# Patient Record
Sex: Male | Born: 1945
Health system: Southern US, Community
[De-identification: ages and names within clinical notes are randomized; demographics above are authoritative.]

## PROBLEM LIST (undated history)

## (undated) DIAGNOSIS — I10 Essential (primary) hypertension: Secondary | ICD-10-CM

## (undated) DIAGNOSIS — E785 Hyperlipidemia, unspecified: Secondary | ICD-10-CM

## (undated) DIAGNOSIS — F329 Major depressive disorder, single episode, unspecified: Secondary | ICD-10-CM

## (undated) DIAGNOSIS — E039 Hypothyroidism, unspecified: Secondary | ICD-10-CM

## (undated) DIAGNOSIS — E119 Type 2 diabetes mellitus without complications: Secondary | ICD-10-CM

## (undated) DIAGNOSIS — F32A Depression, unspecified: Secondary | ICD-10-CM

## (undated) HISTORY — DX: Hypothyroidism, unspecified: E03.9

## (undated) HISTORY — DX: Major depressive disorder, single episode, unspecified: F32.9

## (undated) HISTORY — DX: Hyperlipidemia, unspecified: E78.5

## (undated) HISTORY — DX: Depression, unspecified: F32.A

## (undated) HISTORY — DX: Essential (primary) hypertension: I10

## (undated) HISTORY — PX: FOOT SURGERY: SHX648

---

## 2004-11-15 ENCOUNTER — Ambulatory Visit: Payer: Self-pay | Admitting: Ophthalmology

## 2010-04-16 LAB — HEPATIC FUNCTION PANEL
ALT: 26 U/L (ref 10–40)
AST: 21 U/L (ref 14–40)
Alkaline Phosphatase: 68 U/L (ref 25–125)
Bilirubin, Total: 0.5 mg/dL

## 2010-04-16 LAB — BASIC METABOLIC PANEL
Potassium: 4.1 mmol/L (ref 3.4–5.3)
Sodium: 137 mmol/L (ref 137–147)

## 2010-07-27 LAB — HM DIABETES EYE EXAM

## 2011-08-08 ENCOUNTER — Telehealth: Payer: Self-pay | Admitting: Internal Medicine

## 2011-08-08 NOTE — Telephone Encounter (Signed)
Please send Rx to [facility], patient has signed medical record release at [facility]

## 2011-08-10 ENCOUNTER — Other Ambulatory Visit: Payer: Self-pay | Admitting: Internal Medicine

## 2011-08-14 ENCOUNTER — Other Ambulatory Visit: Payer: Self-pay | Admitting: *Deleted

## 2011-08-14 ENCOUNTER — Encounter: Payer: Self-pay | Admitting: Internal Medicine

## 2011-08-14 MED ORDER — ATORVASTATIN CALCIUM 20 MG PO TABS: 20.0000 mg | ORAL_TABLET | Freq: Every day | ORAL | Status: DC

## 2011-08-14 MED ORDER — CITALOPRAM HYDROBROMIDE 20 MG PO TABS: 20.0000 mg | ORAL_TABLET | Freq: Every day | ORAL | Status: DC

## 2011-08-14 NOTE — Telephone Encounter (Signed)
Is this okay to refill? 

## 2011-08-14 NOTE — Telephone Encounter (Signed)
PT CALLED BACK BMP WAS TO FAX OVER MED LIST  HE IS OUT OF HIS RX'S  PLEASE SEND TO [facility]

## 2011-08-14 NOTE — Telephone Encounter (Signed)
Spoke with patient and advised him to call his pharmacy and have them fax [place] refill request because we have still not received his records from [facility] and so therefore do not have any of his meds on file.

## 2011-09-13 ENCOUNTER — Encounter: Payer: Self-pay | Admitting: Internal Medicine

## 2011-10-11 ENCOUNTER — Ambulatory Visit (INDEPENDENT_AMBULATORY_CARE_PROVIDER_SITE_OTHER): Payer: PRIVATE HEALTH INSURANCE | Admitting: Internal Medicine

## 2011-10-11 ENCOUNTER — Encounter: Payer: Self-pay | Admitting: Internal Medicine

## 2011-10-11 VITALS — BP 132/72 | HR 69 | Temp 98.2°F | Ht 68.0 in | Wt 250.0 lb

## 2011-10-11 DIAGNOSIS — E039 Hypothyroidism, unspecified: Secondary | ICD-10-CM | POA: Insufficient documentation

## 2011-10-11 DIAGNOSIS — E663 Overweight: Secondary | ICD-10-CM | POA: Insufficient documentation

## 2011-10-11 DIAGNOSIS — J309 Allergic rhinitis, unspecified: Secondary | ICD-10-CM | POA: Insufficient documentation

## 2011-10-11 DIAGNOSIS — E785 Hyperlipidemia, unspecified: Secondary | ICD-10-CM | POA: Insufficient documentation

## 2011-10-11 DIAGNOSIS — I10 Essential (primary) hypertension: Secondary | ICD-10-CM | POA: Insufficient documentation

## 2011-10-11 DIAGNOSIS — F329 Major depressive disorder, single episode, unspecified: Secondary | ICD-10-CM

## 2011-10-11 DIAGNOSIS — F411 Generalized anxiety disorder: Secondary | ICD-10-CM | POA: Insufficient documentation

## 2011-10-11 DIAGNOSIS — E669 Obesity, unspecified: Secondary | ICD-10-CM

## 2011-10-11 MED ORDER — MOMETASONE FUROATE 50 MCG/ACT NA SUSP: 1.0000 | Freq: Every day | NASAL | Status: DC

## 2011-10-11 MED ORDER — ATORVASTATIN CALCIUM 20 MG PO TABS: 20.0000 mg | ORAL_TABLET | Freq: Every day | ORAL | Status: DC

## 2011-10-11 MED ORDER — LEVOTHYROXINE SODIUM 50 MCG PO TABS: 50.0000 ug | ORAL_TABLET | Freq: Every day | ORAL | Status: DC

## 2011-10-11 MED ORDER — CITALOPRAM HYDROBROMIDE 20 MG PO TABS: 20.0000 mg | ORAL_TABLET | Freq: Every day | ORAL | Status: DC

## 2011-10-11 MED ORDER — LISINOPRIL-HYDROCHLOROTHIAZIDE 10-12.5 MG PO TABS: 1.0000 | ORAL_TABLET | Freq: Every day | ORAL | Status: DC

## 2011-10-11 NOTE — Progress Notes (Signed)
Subjective:    Patient ID: Timothy Edwards, male    DOB: Dec 20, 1945, 65 y.o.   MRN: [id number]  HPI 65 year old male with a history of hypertension, hyperlipidemia, and hypothyroidism presents for followup. He reports that he has been feeling well. He reports full compliance with his medications. His primary concern today is weight gain. He notes that he is the heaviest he has ever been in his life. He is particularly concerned about abdominal obesity. He notes that he has been fairly sedentary and his retirement. 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 He notes some dietary indiscretion with increased intake of carbohydrates. He has not been counting calories or keeping a food diary. He does not get any regular exercise.  Outpatient Encounter Prescriptions as of 10/11/2011  Medication Sig Dispense Refill  . atorvastatin (LIPITOR) 20 MG tablet Take 1 tablet (20 mg total) by mouth daily.  90 tablet  3  . cholecalciferol (VITAMIN D) 1000 UNITS tablet Take 1,000 Units by mouth daily.        . citalopram (CELEXA) 20 MG tablet Take 1 tablet (20 mg total) by mouth daily.  90 tablet  3  . levothyroxine (SYNTHROID) 50 MCG tablet Take 1 tablet (50 mcg total) by mouth daily.  90 tablet  3  . lisinopril-hydrochlorothiazide (PRINZIDE,ZESTORETIC) 10-12.5 MG per tablet Take 1 tablet by mouth daily.  90 tablet  3  . mometasone (NASONEX) 50 MCG/ACT nasal spray Place 1 spray into the nose daily.  17 g  3    Review of Systems  Constitutional: Negative for fever, chills, activity change, appetite change, fatigue and unexpected weight change.  Eyes: Negative for visual disturbance.  Respiratory: Negative for cough and shortness of breath.   Cardiovascular: Negative for chest pain, palpitations and leg swelling.  Gastrointestinal: Negative for abdominal pain and abdominal distention.  Genitourinary: Negative for dysuria, urgency and difficulty urinating.  Musculoskeletal: Negative for  arthralgias and gait problem.  Skin: Negative for color change and rash.  Hematological: Negative for adenopathy.  Psychiatric/Behavioral: Negative for sleep disturbance and dysphoric mood. The patient is not nervous/anxious.    BP 132/72  Pulse 69  Temp(Src) 98.2 F (36.8 C) (Oral)  Wt 250 lb (113.399 kg)  SpO2 96%     Objective:   Physical Exam  Constitutional: He is oriented to person, place, and time. He appears well-developed and well-nourished. No distress.       Obese   HENT:  Head: Normocephalic and atraumatic.  Right Ear: External ear normal.  Left Ear: External ear normal.  Nose: Nose normal.  Mouth/Throat: Oropharynx is clear and moist. No oropharyngeal exudate.  Eyes: Conjunctivae and EOM are normal. Pupils are equal, round, and reactive to light. Right eye exhibits no discharge. Left eye exhibits no discharge. No scleral icterus.  Neck: Normal range of motion. Neck supple. No tracheal deviation present. No thyromegaly present.  Cardiovascular: Normal rate, regular rhythm and normal heart sounds.  Exam reveals no gallop and no friction rub.   No murmur heard. Pulmonary/Chest: Effort normal and breath sounds normal. No respiratory distress. He has no wheezes. He has no rales. He exhibits no tenderness.  Musculoskeletal: Normal range of motion. He exhibits no edema.  Lymphadenopathy:    He has no cervical adenopathy.  Neurological: He is alert and oriented to person, place, and time. No cranial nerve deficit. Coordination normal.  Skin: Skin is warm and dry. No rash noted. He is not diaphoretic. No erythema. No  pallor.  Psychiatric: He has a normal mood and affect. His behavior is normal. Judgment and thought content normal.          Assessment & Plan:  1. Obesity - discussed potential interventions to help with weight loss including calorie restriction. We also discussed a goal of regular aerobic exercise with a goal of 30-60 minutes most days of the week. We  discussed some commercial plan such as Weight Watchers. We also discussed the risk of central obesity as an independent risk factor for heart disease. Patient will look into some of the things we discussed and then we'll start effort at weight loss with a goal of 1-2 pounds of weight loss per week. He will followup in one month.  2. Hypertension - BP well controlled on current meds. Renal function with labs this week. Follow up 6 months.  3. Hypothyroidism - Will check TSH with labs. Continue Synthroid.  4. Hyperlipidemia - Will check lipids and LFTs with labs. Continue Lipitor.

## 2011-10-11 NOTE — Patient Instructions (Signed)
Call if any concerns. 

## 2011-11-10 ENCOUNTER — Ambulatory Visit: Payer: PRIVATE HEALTH INSURANCE | Admitting: Internal Medicine

## 2011-11-16 ENCOUNTER — Other Ambulatory Visit: Payer: Self-pay | Admitting: *Deleted

## 2011-11-16 DIAGNOSIS — E785 Hyperlipidemia, unspecified: Secondary | ICD-10-CM

## 2011-11-16 MED ORDER — ATORVASTATIN CALCIUM 20 MG PO TABS: 20.0000 mg | ORAL_TABLET | Freq: Every day | ORAL | Status: DC

## 2011-12-11 ENCOUNTER — Ambulatory Visit: Payer: PRIVATE HEALTH INSURANCE | Admitting: Internal Medicine

## 2011-12-18 ENCOUNTER — Telehealth: Payer: Self-pay | Admitting: Internal Medicine

## 2011-12-18 NOTE — Telephone Encounter (Signed)
[phone] Pt called had questions about 10/11/11.  Ins co told him that this was billed under the wrong diagnostic code.

## 2012-01-02 ENCOUNTER — Ambulatory Visit (INDEPENDENT_AMBULATORY_CARE_PROVIDER_SITE_OTHER): Payer: PRIVATE HEALTH INSURANCE | Admitting: Internal Medicine

## 2012-01-02 ENCOUNTER — Encounter: Payer: Self-pay | Admitting: Internal Medicine

## 2012-01-02 DIAGNOSIS — E669 Obesity, unspecified: Secondary | ICD-10-CM

## 2012-01-02 DIAGNOSIS — E039 Hypothyroidism, unspecified: Secondary | ICD-10-CM

## 2012-01-02 DIAGNOSIS — I10 Essential (primary) hypertension: Secondary | ICD-10-CM

## 2012-01-02 DIAGNOSIS — E785 Hyperlipidemia, unspecified: Secondary | ICD-10-CM

## 2012-01-02 LAB — COMPREHENSIVE METABOLIC PANEL
Albumin: 3.9 g/dL (ref 3.5–5.2)
Alkaline Phosphatase: 55 U/L (ref 39–117)
BUN: 15 mg/dL (ref 6–23)
CO2: 29 mEq/L (ref 19–32)
Calcium: 9.1 mg/dL (ref 8.4–10.5)
GFR: 98.11 mL/min (ref >60.00)
Glucose, Bld: 101 mg/dL — ABNORMAL HIGH (ref 70–99)
Potassium: 4 mEq/L (ref 3.5–5.1)

## 2012-01-02 LAB — LIPID PANEL
Cholesterol: 173 mg/dL (ref 0–200)
LDL Cholesterol: 103 mg/dL — ABNORMAL HIGH (ref 0–99)
Triglycerides: 67 mg/dL (ref 0.0–149.0)

## 2012-01-02 NOTE — Assessment & Plan Note (Signed)
Will check TSH with labs today. 

## 2012-01-02 NOTE — Assessment & Plan Note (Signed)
BMI 34. Encouraged patient to keep a food diary. Encouraged patient to limit intake of processed carbohydrates. Encouraged him to exercise by walking 30-60 minutes 5 days per week. Goal would be 1-2 pounds of weight loss per week. He will followup in one month.

## 2012-01-02 NOTE — Assessment & Plan Note (Signed)
Will check lipid profile and LFTs with labs today.

## 2012-01-02 NOTE — Progress Notes (Signed)
  Subjective:    Patient ID: Timothy Edwards, male    DOB: Aug 14, 1946, 66 y.o.   MRN: [id number]  HPI 66 year old male with history of hypertension, hypothyroidism, hyperlipidemia, and obesity presents for followup. He reports that over the last couple of months, he started making some efforts at weight loss, then lost motivation over the holidays. He has gained 1 pound since his last visit. He is sedentary and has not been exercising regularly. He reports intake of high sugar and high-fat foods. He is interested in losing weight would like to get back on track.  In regards to his hypertension, he reports full compliance with his medications. He denies any chest pain, palpitations, headache. In regards to his hypothyroidism, he also reports full compliance with his Synthroid. He denies any fatigue, constipation, or hot or cold intolerance.    Review of Systems  Constitutional: Negative for fever, chills, activity change, appetite change, fatigue and unexpected weight change.  Eyes: Negative for visual disturbance.  Respiratory: Negative for cough and shortness of breath.   Cardiovascular: Negative for chest pain, palpitations and leg swelling.  Gastrointestinal: Negative for abdominal pain and abdominal distention.  Genitourinary: Negative for dysuria, urgency and difficulty urinating.  Musculoskeletal: Negative for arthralgias and gait problem.  Skin: Negative for color change and rash.  Hematological: Negative for adenopathy.  Psychiatric/Behavioral: Negative for sleep disturbance and dysphoric mood. The patient is not nervous/anxious.        Objective:   Physical Exam  Constitutional: He is oriented to person, place, and time. He appears well-developed and well-nourished. No distress.  HENT:  Head: Normocephalic and atraumatic.  Right Ear: External ear normal.  Left Ear: External ear normal.  Nose: Nose normal.  Mouth/Throat: Oropharynx is clear and moist. No oropharyngeal exudate.   Eyes: Conjunctivae and EOM are normal. Pupils are equal, round, and reactive to light. Right eye exhibits no discharge. Left eye exhibits no discharge. No scleral icterus.  Neck: Normal range of motion. Neck supple. No tracheal deviation present. No thyromegaly present.  Cardiovascular: Normal rate, regular rhythm and normal heart sounds.  Exam reveals no gallop and no friction rub.   No murmur heard. Pulmonary/Chest: Effort normal and breath sounds normal. No respiratory distress. He has no wheezes. He has no rales. He exhibits no tenderness.  Musculoskeletal: Normal range of motion. He exhibits no edema.  Lymphadenopathy:    He has no cervical adenopathy.  Neurological: He is alert and oriented to person, place, and time. No cranial nerve deficit. Coordination normal.  Skin: Skin is warm and dry. No rash noted. He is not diaphoretic. No erythema. No pallor.  Psychiatric: He has a normal mood and affect. His behavior is normal. Judgment and thought content normal.          Assessment & Plan:

## 2012-01-02 NOTE — Assessment & Plan Note (Signed)
Blood pressure well-controlled today. We'll check renal function with labs. Followup in one month.

## 2012-01-02 NOTE — Patient Instructions (Signed)
Goal - Walk 30-46min at least 5 days per week. Goal - Record food intake.

## 2012-01-03 ENCOUNTER — Telehealth: Payer: Self-pay | Admitting: *Deleted

## 2012-01-03 NOTE — Telephone Encounter (Signed)
Message copied by Vernie Murders on Wed Jan 03, 2012  8:00 AM ------      Message from: Ronna Polio A      Created: Tue Jan 02, 2012  2:34 PM       Labs show that liver function tests are slightly high.  This can be from viral infection, weight gain, or other cause. Would like to repeat LFTS with visit in 1 month.

## 2012-01-10 NOTE — Telephone Encounter (Signed)
I called and spoke with the billing department they were sending it to the coding department to look at.  Advised patient that they were looking into it and to disregard bill for at least 45 days per Billing.

## 2012-02-08 ENCOUNTER — Ambulatory Visit: Payer: PRIVATE HEALTH INSURANCE | Admitting: Internal Medicine

## 2012-02-08 DIAGNOSIS — Z0289 Encounter for other administrative examinations: Secondary | ICD-10-CM

## 2012-02-16 ENCOUNTER — Encounter: Payer: Self-pay | Admitting: Internal Medicine

## 2012-02-16 DIAGNOSIS — E559 Vitamin D deficiency, unspecified: Secondary | ICD-10-CM | POA: Insufficient documentation

## 2012-07-18 ENCOUNTER — Other Ambulatory Visit (INDEPENDENT_AMBULATORY_CARE_PROVIDER_SITE_OTHER): Payer: PRIVATE HEALTH INSURANCE | Admitting: *Deleted

## 2012-07-18 DIAGNOSIS — E785 Hyperlipidemia, unspecified: Secondary | ICD-10-CM

## 2012-07-18 DIAGNOSIS — E039 Hypothyroidism, unspecified: Secondary | ICD-10-CM

## 2012-07-18 DIAGNOSIS — I10 Essential (primary) hypertension: Secondary | ICD-10-CM

## 2012-07-18 LAB — LIPID PANEL
Cholesterol: 151 mg/dL (ref 0–200)
HDL: 49.2 mg/dL (ref >39.00)
LDL Cholesterol: 74 mg/dL (ref 0–99)
Triglycerides: 141 mg/dL (ref 0.0–149.0)
VLDL: 28.2 mg/dL (ref 0.0–40.0)

## 2012-07-18 LAB — COMPREHENSIVE METABOLIC PANEL
Albumin: 3.8 g/dL (ref 3.5–5.2)
Alkaline Phosphatase: 63 U/L (ref 39–117)
BUN: 13 mg/dL (ref 6–23)
Creatinine, Ser: 0.7 mg/dL (ref 0.4–1.5)
Glucose, Bld: 106 mg/dL — ABNORMAL HIGH (ref 70–99)
Total Bilirubin: 0.7 mg/dL (ref 0.3–1.2)

## 2012-07-24 ENCOUNTER — Ambulatory Visit (INDEPENDENT_AMBULATORY_CARE_PROVIDER_SITE_OTHER): Payer: PRIVATE HEALTH INSURANCE | Admitting: Internal Medicine

## 2012-07-24 ENCOUNTER — Encounter: Payer: Self-pay | Admitting: Internal Medicine

## 2012-07-24 VITALS — BP 160/90 | HR 69 | Temp 98.7°F | Wt 253.2 lb

## 2012-07-24 DIAGNOSIS — I10 Essential (primary) hypertension: Secondary | ICD-10-CM

## 2012-07-24 DIAGNOSIS — E039 Hypothyroidism, unspecified: Secondary | ICD-10-CM

## 2012-07-24 DIAGNOSIS — E669 Obesity, unspecified: Secondary | ICD-10-CM

## 2012-07-24 DIAGNOSIS — E785 Hyperlipidemia, unspecified: Secondary | ICD-10-CM

## 2012-07-24 NOTE — Progress Notes (Signed)
Subjective:    Patient ID: Timothy Edwards, male    DOB: 16-Oct-1946, 66 y.o.   MRN: [id number]  HPI 66 year old male with history of hypertension, hyperlipidemia, hypothyroidism, obesity presents for followup. He reports he is generally feeling well. He notes some dietary indiscretion recently, for example eating 2 cakes that his wife made over a three-day period. He reports that it is difficult to limit his food intake, particularly carbohydrate intake when he is staying at home. He has not started an exercise program. He would like to lose weight.  In regards to his chronic medical issues including hypertension, hyperlipidemia, and hypothyroidism he reports full compliance with his medications. He reports that his blood pressure has been well-controlled at home, typically 120s over 80s. He denies any chest pain, palpitations, headache.  Outpatient Encounter Prescriptions as of 07/24/2012  Medication Sig Dispense Refill  . atorvastatin (LIPITOR) 20 MG tablet Take 1 tablet (20 mg total) by mouth daily.  90 tablet  3  . cholecalciferol (VITAMIN D) 1000 UNITS tablet Take 1,000 Units by mouth daily.        . citalopram (CELEXA) 20 MG tablet Take 1 tablet (20 mg total) by mouth daily.  90 tablet  3  . levothyroxine (SYNTHROID) 50 MCG tablet Take 1 tablet (50 mcg total) by mouth daily.  90 tablet  3  . lisinopril-hydrochlorothiazide (PRINZIDE,ZESTORETIC) 10-12.5 MG per tablet Take 1 tablet by mouth daily.  90 tablet  3  . mometasone (NASONEX) 50 MCG/ACT nasal spray Place 1 spray into the nose daily as needed.       BP 160/90  Pulse 69  Temp 98.7 F (37.1 C) (Oral)  Wt 253 lb 4 oz (114.873 kg)  SpO2 99%  Review of Systems  Constitutional: Negative for fever, chills, activity change, appetite change, fatigue and unexpected weight change.  Eyes: Negative for visual disturbance.  Respiratory: Negative for cough and shortness of breath.   Cardiovascular: Negative for chest pain, palpitations and  leg swelling.  Gastrointestinal: Negative for abdominal pain and abdominal distention.  Genitourinary: Negative for dysuria, urgency and difficulty urinating.  Musculoskeletal: Negative for arthralgias and gait problem.  Skin: Negative for color change and rash.  Hematological: Negative for adenopathy.  Psychiatric/Behavioral: Negative for disturbed wake/sleep cycle and dysphoric mood. The patient is not nervous/anxious.        Objective:   Physical Exam  Constitutional: He is oriented to person, place, and time. He appears well-developed and well-nourished. No distress.  HENT:  Head: Normocephalic and atraumatic.  Right Ear: External ear normal.  Left Ear: External ear normal.  Nose: Nose normal.  Mouth/Throat: Oropharynx is clear and moist. No oropharyngeal exudate.  Eyes: Conjunctivae and EOM are normal. Pupils are equal, round, and reactive to light. Right eye exhibits no discharge. Left eye exhibits no discharge. No scleral icterus.  Neck: Normal range of motion. Neck supple. No tracheal deviation present. No thyromegaly present.  Cardiovascular: Normal rate, regular rhythm and normal heart sounds.  Exam reveals no gallop and no friction rub.   No murmur heard. Pulmonary/Chest: Effort normal and breath sounds normal. No respiratory distress. He has no wheezes. He has no rales. He exhibits no tenderness.  Musculoskeletal: Normal range of motion. He exhibits no edema.  Lymphadenopathy:    He has no cervical adenopathy.  Neurological: He is alert and oriented to person, place, and time. No cranial nerve deficit. Coordination normal.  Skin: Skin is warm and dry. No rash noted. He is not diaphoretic. No  erythema. No pallor.  Psychiatric: He has a normal mood and affect. His behavior is normal. Judgment and thought content normal.          Assessment & Plan:

## 2012-07-24 NOTE — Assessment & Plan Note (Signed)
Recent TSH was normal. Will continue Synthroid.

## 2012-07-24 NOTE — Assessment & Plan Note (Signed)
Blood pressure is elevated today, however patient reports good control of blood pressure at home. We'll have him monitor blood pressure at home and e-mail with results. He will followup in one month for recheck. We'll continue medications as they are for now.

## 2012-07-24 NOTE — Assessment & Plan Note (Signed)
Encouraged better compliance with diet and increase physical activity with goal of walking 30 minutes daily. We also discussed keeping a food diary. Followup in one month.

## 2012-07-24 NOTE — Assessment & Plan Note (Signed)
Recent lipids were well controlled with Lipitor. LFTs were improved. Will continue Lipitor. Followup one month.

## 2012-07-24 NOTE — Patient Instructions (Addendum)
Check blood pressure at home 1-2 times per week. Email with readings.   [email or web address]

## 2012-08-26 ENCOUNTER — Ambulatory Visit (INDEPENDENT_AMBULATORY_CARE_PROVIDER_SITE_OTHER): Payer: PRIVATE HEALTH INSURANCE | Admitting: Internal Medicine

## 2012-08-26 ENCOUNTER — Encounter: Payer: Self-pay | Admitting: Internal Medicine

## 2012-08-26 VITALS — BP 122/82 | HR 73 | Temp 98.3°F | Ht 71.0 in | Wt 247.0 lb

## 2012-08-26 DIAGNOSIS — I10 Essential (primary) hypertension: Secondary | ICD-10-CM

## 2012-08-26 DIAGNOSIS — E669 Obesity, unspecified: Secondary | ICD-10-CM

## 2012-08-26 NOTE — Progress Notes (Signed)
Subjective:    Patient ID: Timothy Edwards, male    DOB: June 02, 1946, 66 y.o.   MRN: [id number]  HPI 66 year old male with history of hypertension, hyperlipidemia, hypothyroidism presents for followup. At his last visit, blood pressure was noted to be elevated. He reports that over the last month, blood pressure has been fairly well-controlled at home. He did not bring a record of blood pressure today. He denies any headache, chest pain, palpitations. He reports full compliance with medication. He notes he has been trying to improve his diet and has lost approximately 6 pounds.  Outpatient Encounter Prescriptions as of 08/26/2012  Medication Sig Dispense Refill  . atorvastatin (LIPITOR) 20 MG tablet Take 1 tablet (20 mg total) by mouth daily.  90 tablet  3  . cholecalciferol (VITAMIN D) 1000 UNITS tablet Take 1,000 Units by mouth daily.        . citalopram (CELEXA) 20 MG tablet Take 1 tablet (20 mg total) by mouth daily.  90 tablet  3  . levothyroxine (SYNTHROID) 50 MCG tablet Take 1 tablet (50 mcg total) by mouth daily.  90 tablet  3  . lisinopril-hydrochlorothiazide (PRINZIDE,ZESTORETIC) 10-12.5 MG per tablet Take 1 tablet by mouth daily.  90 tablet  3  . DISCONTD: mometasone (NASONEX) 50 MCG/ACT nasal spray Place 1 spray into the nose daily as needed.       BP 122/82  Pulse 73  Temp 98.3 F (36.8 C) (Oral)  Ht 5\' 11"  (1.803 m)  Wt 247 lb (112.038 kg)  BMI 34.45 kg/m2  SpO2 95%  Review of Systems  Constitutional: Negative for fever, chills, activity change, appetite change, fatigue and unexpected weight change.  Eyes: Negative for visual disturbance.  Respiratory: Negative for cough and shortness of breath.   Cardiovascular: Negative for chest pain, palpitations and leg swelling.  Gastrointestinal: Negative for abdominal pain and abdominal distention.  Genitourinary: Negative for dysuria, urgency and difficulty urinating.  Musculoskeletal: Negative for arthralgias and gait problem.    Skin: Negative for color change and rash.  Hematological: Negative for adenopathy.  Psychiatric/Behavioral: Negative for disturbed wake/sleep cycle and dysphoric mood. The patient is not nervous/anxious.        Objective:   Physical Exam  Constitutional: He is oriented to person, place, and time. He appears well-developed and well-nourished. No distress.  HENT:  Head: Normocephalic and atraumatic.  Right Ear: External ear normal.  Left Ear: External ear normal.  Nose: Nose normal.  Mouth/Throat: Oropharynx is clear and moist. No oropharyngeal exudate.  Eyes: Conjunctivae normal and EOM are normal. Pupils are equal, round, and reactive to light. Right eye exhibits no discharge. Left eye exhibits no discharge. No scleral icterus.  Neck: Normal range of motion. Neck supple. No tracheal deviation present. No thyromegaly present.  Cardiovascular: Normal rate, regular rhythm and normal heart sounds.  Exam reveals no gallop and no friction rub.   No murmur heard. Pulmonary/Chest: Effort normal and breath sounds normal. No respiratory distress. He has no wheezes. He has no rales. He exhibits no tenderness.  Musculoskeletal: Normal range of motion. He exhibits no edema.  Lymphadenopathy:    He has no cervical adenopathy.  Neurological: He is alert and oriented to person, place, and time. No cranial nerve deficit. Coordination normal.  Skin: Skin is warm and dry. No rash noted. He is not diaphoretic. No erythema. No pallor.  Psychiatric: He has a normal mood and affect. His behavior is normal. Judgment and thought content normal.  Assessment & Plan:

## 2012-08-26 NOTE — Assessment & Plan Note (Signed)
Congratulated patient on 6 pounds weight loss. Encouraged him to continue efforts at healthy diet and regular physical activity.

## 2012-08-26 NOTE — Assessment & Plan Note (Signed)
Blood pressure much better controlled today. We'll continue to monitor. Follow up 3 months.

## 2012-11-25 ENCOUNTER — Ambulatory Visit (INDEPENDENT_AMBULATORY_CARE_PROVIDER_SITE_OTHER): Payer: PRIVATE HEALTH INSURANCE | Admitting: Internal Medicine

## 2012-11-25 ENCOUNTER — Encounter: Payer: Self-pay | Admitting: Internal Medicine

## 2012-11-25 VITALS — BP 124/78 | HR 71 | Temp 98.1°F | Resp 17 | Wt 248.0 lb

## 2012-11-25 DIAGNOSIS — F329 Major depressive disorder, single episode, unspecified: Secondary | ICD-10-CM

## 2012-11-25 DIAGNOSIS — E669 Obesity, unspecified: Secondary | ICD-10-CM

## 2012-11-25 DIAGNOSIS — I10 Essential (primary) hypertension: Secondary | ICD-10-CM

## 2012-11-25 DIAGNOSIS — F3289 Other specified depressive episodes: Secondary | ICD-10-CM

## 2012-11-25 DIAGNOSIS — F32A Depression, unspecified: Secondary | ICD-10-CM

## 2012-11-25 DIAGNOSIS — E785 Hyperlipidemia, unspecified: Secondary | ICD-10-CM

## 2012-11-25 MED ORDER — CITALOPRAM HYDROBROMIDE 20 MG PO TABS: 20.0000 mg | ORAL_TABLET | Freq: Every day | ORAL | Status: DC

## 2012-11-25 MED ORDER — ATORVASTATIN CALCIUM 20 MG PO TABS: 20.0000 mg | ORAL_TABLET | Freq: Every day | ORAL | Status: DC

## 2012-11-25 NOTE — Assessment & Plan Note (Signed)
Lipids have been well controlled with Lipitor. We'll plan to recheck lipids and LFTs at next visit in 3 months.

## 2012-11-25 NOTE — Assessment & Plan Note (Signed)
BP Readings from Last 3 Encounters:  11/25/12 124/78  08/26/12 122/82  07/24/12 160/90    Blood pressure well-controlled with current medications. We'll plan to recheck renal function with labs in 3 months.

## 2012-11-25 NOTE — Assessment & Plan Note (Signed)
Symptoms well controlled with Celexa. We'll continue.

## 2012-11-25 NOTE — Assessment & Plan Note (Addendum)
Wt Readings from Last 3 Encounters:  11/25/12 248 lb (112.492 kg)  08/26/12 247 lb (112.038 kg)  07/24/12 253 lb 4 oz (114.873 kg)     BMI 34. No improvement in weight since last visit. Encouraged patient to have better compliance with diet and set goal of walking at least 40 minutes 3 times per week. Followup 3 months or sooner as needed.

## 2012-11-25 NOTE — Progress Notes (Signed)
  Subjective:    Patient ID: Timothy Edwards, male    DOB: March 31, 1946, 66 y.o.   MRN: [id number]  HPI 66 year old male with history of obesity, hypertension, hypothyroidism, hyperlipidemia, depression presents for followup. He reports that he is generally feeling well. He notes some dietary indiscretion over the holidays. He exercises occasionally by walking his dog for approximately 15 minutes. He does not follow any specific diet or exercise program however. He reports full compliance with his medications. He denies any new concerns today.  Outpatient Encounter Prescriptions as of 11/25/2012  Medication Sig Dispense Refill  . cholecalciferol (VITAMIN D) 1000 UNITS tablet Take 1,000 Units by mouth daily.        Marland Kitchen levothyroxine (SYNTHROID) 50 MCG tablet Take 1 tablet (50 mcg total) by mouth daily.  90 tablet  3  . lisinopril-hydrochlorothiazide (PRINZIDE,ZESTORETIC) 10-12.5 MG per tablet Take 1 tablet by mouth daily.  90 tablet  3  . atorvastatin (LIPITOR) 20 MG tablet Take 1 tablet (20 mg total) by mouth daily.  90 tablet  3  . citalopram (CELEXA) 20 MG tablet Take 1 tablet (20 mg total) by mouth daily.  90 tablet  3  . [DISCONTINUED] atorvastatin (LIPITOR) 20 MG tablet Take 1 tablet (20 mg total) by mouth daily.  90 tablet  3   BP 124/78  Pulse 71  Temp 98.1 F (36.7 C) (Oral)  Resp 17  Wt 248 lb (112.492 kg)  Review of Systems  Constitutional: Negative for fever, chills, activity change, appetite change, fatigue and unexpected weight change.  Eyes: Negative for visual disturbance.  Respiratory: Negative for cough and shortness of breath.   Cardiovascular: Negative for chest pain, palpitations and leg swelling.  Gastrointestinal: Negative for abdominal pain and abdominal distention.  Genitourinary: Negative for dysuria, urgency and difficulty urinating.  Musculoskeletal: Negative for arthralgias and gait problem.  Skin: Negative for color change and rash.  Hematological: Negative for  adenopathy.  Psychiatric/Behavioral: Negative for sleep disturbance and dysphoric mood. The patient is not nervous/anxious.        Objective:   Physical Exam  Constitutional: He is oriented to person, place, and time. He appears well-developed and well-nourished. No distress.  HENT:  Head: Normocephalic and atraumatic.  Right Ear: External ear normal.  Left Ear: External ear normal.  Nose: Nose normal.  Mouth/Throat: Oropharynx is clear and moist. No oropharyngeal exudate.  Eyes: Conjunctivae normal and EOM are normal. Pupils are equal, round, and reactive to light. Right eye exhibits no discharge. Left eye exhibits no discharge. No scleral icterus.  Neck: Normal range of motion. Neck supple. No tracheal deviation present. No thyromegaly present.  Cardiovascular: Normal rate, regular rhythm and normal heart sounds.  Exam reveals no gallop and no friction rub.   No murmur heard. Pulmonary/Chest: Effort normal and breath sounds normal. No respiratory distress. He has no wheezes. He has no rales. He exhibits no tenderness.  Musculoskeletal: Normal range of motion. He exhibits no edema.  Lymphadenopathy:    He has no cervical adenopathy.  Neurological: He is alert and oriented to person, place, and time. No cranial nerve deficit. Coordination normal.  Skin: Skin is warm and dry. No rash noted. He is not diaphoretic. No erythema. No pallor.  Psychiatric: He has a normal mood and affect. His behavior is normal. Judgment and thought content normal.          Assessment & Plan:

## 2012-12-23 ENCOUNTER — Telehealth: Payer: Self-pay | Admitting: Internal Medicine

## 2012-12-23 DIAGNOSIS — I10 Essential (primary) hypertension: Secondary | ICD-10-CM

## 2012-12-23 DIAGNOSIS — E039 Hypothyroidism, unspecified: Secondary | ICD-10-CM

## 2012-12-23 NOTE — Telephone Encounter (Addendum)
levothyroxine (SYNTHROID) 50 MCG tablet  # 90  lisinopril-hydrochlorothiazide (PRINZIDE,ZESTORETIC) 10-12.5 #90

## 2012-12-24 MED ORDER — LISINOPRIL-HYDROCHLOROTHIAZIDE 10-12.5 MG PO TABS: 1.0000 | ORAL_TABLET | Freq: Every day | ORAL | Status: DC

## 2012-12-24 MED ORDER — LEVOTHYROXINE SODIUM 50 MCG PO TABS: 50.0000 ug | ORAL_TABLET | Freq: Every day | ORAL | Status: DC

## 2012-12-24 NOTE — Telephone Encounter (Signed)
Refills sent to pharmacy electronically. 

## 2013-01-18 ENCOUNTER — Other Ambulatory Visit: Payer: Self-pay

## 2013-02-28 ENCOUNTER — Ambulatory Visit (INDEPENDENT_AMBULATORY_CARE_PROVIDER_SITE_OTHER): Payer: Self-pay | Admitting: Internal Medicine

## 2013-02-28 ENCOUNTER — Other Ambulatory Visit: Payer: Self-pay | Admitting: Internal Medicine

## 2013-02-28 ENCOUNTER — Encounter: Payer: Self-pay | Admitting: Internal Medicine

## 2013-02-28 VITALS — BP 120/80 | HR 75 | Temp 98.4°F | Wt 253.0 lb

## 2013-02-28 DIAGNOSIS — F329 Major depressive disorder, single episode, unspecified: Secondary | ICD-10-CM

## 2013-02-28 DIAGNOSIS — E669 Obesity, unspecified: Secondary | ICD-10-CM

## 2013-02-28 DIAGNOSIS — R739 Hyperglycemia, unspecified: Secondary | ICD-10-CM

## 2013-02-28 DIAGNOSIS — Z125 Encounter for screening for malignant neoplasm of prostate: Secondary | ICD-10-CM

## 2013-02-28 DIAGNOSIS — E039 Hypothyroidism, unspecified: Secondary | ICD-10-CM

## 2013-02-28 DIAGNOSIS — I10 Essential (primary) hypertension: Secondary | ICD-10-CM

## 2013-02-28 DIAGNOSIS — F3289 Other specified depressive episodes: Secondary | ICD-10-CM

## 2013-02-28 DIAGNOSIS — E785 Hyperlipidemia, unspecified: Secondary | ICD-10-CM

## 2013-02-28 LAB — LIPID PANEL
Cholesterol: 148 mg/dL (ref 0–200)
HDL: 51.2 mg/dL (ref >39.00)
LDL Cholesterol: 79 mg/dL (ref 0–99)
VLDL: 17.8 mg/dL (ref 0.0–40.0)

## 2013-02-28 LAB — MICROALBUMIN / CREATININE URINE RATIO
Creatinine,U: 134.3 mg/dL
Microalb Creat Ratio: 0.3 mg/g (ref 0.0–30.0)

## 2013-02-28 LAB — COMPREHENSIVE METABOLIC PANEL
Alkaline Phosphatase: 61 U/L (ref 39–117)
Creatinine, Ser: 0.9 mg/dL (ref 0.4–1.5)
GFR: 86.82 mL/min (ref >60.00)
Glucose, Bld: 126 mg/dL — ABNORMAL HIGH (ref 70–99)
Sodium: 135 mEq/L (ref 135–145)
Total Bilirubin: 1 mg/dL (ref 0.3–1.2)
Total Protein: 7.1 g/dL (ref 6.0–8.3)

## 2013-02-28 NOTE — Assessment & Plan Note (Signed)
Symptoms well controlled on Celexa. We'll continue.

## 2013-02-28 NOTE — Progress Notes (Signed)
  Subjective:    Patient ID: Timothy Edwards, male    DOB: August 19, 1946, 67 y.o.   MRN: [id number]  HPI 67 year old male with history of hypertension, hyperlipidemia, hypothyroidism, obesity, depression presents for followup. He reports he is generally feeling well. Energy level is good. He has been active doing projects around the house. He notes he has been more sedentary in general over the winter months. He is not keeping a food diary or monitoring calorie intake. He denies any recent symptoms of chest pain, palpitations, headache. Symptoms of depression and anxiety well controlled with use of Celexa.  Outpatient Encounter Prescriptions as of 02/28/2013  Medication Sig Dispense Refill  . atorvastatin (LIPITOR) 20 MG tablet Take 1 tablet (20 mg total) by mouth daily.  90 tablet  3  . cholecalciferol (VITAMIN D) 1000 UNITS tablet Take 1,000 Units by mouth daily.        . citalopram (CELEXA) 20 MG tablet Take 1 tablet (20 mg total) by mouth daily.  90 tablet  3  . levothyroxine (SYNTHROID) 50 MCG tablet Take 1 tablet (50 mcg total) by mouth daily.  90 tablet  1  . lisinopril-hydrochlorothiazide (PRINZIDE,ZESTORETIC) 10-12.5 MG per tablet Take 1 tablet by mouth daily.  90 tablet  1   No facility-administered encounter medications on file as of 02/28/2013.   BP 120/80  Pulse 75  Temp(Src) 98.4 F (36.9 C) (Oral)  Wt 253 lb (114.76 kg)  BMI 35.3 kg/m2  SpO2 94%   Review of Systems  Constitutional: Negative for fever, chills, activity change, appetite change, fatigue and unexpected weight change.  Eyes: Negative for visual disturbance.  Respiratory: Negative for cough and shortness of breath.   Cardiovascular: Negative for chest pain, palpitations and leg swelling.  Gastrointestinal: Negative for abdominal pain and abdominal distention.  Genitourinary: Negative for dysuria, urgency and difficulty urinating.  Musculoskeletal: Negative for arthralgias and gait problem.  Skin: Negative for  color change and rash.  Hematological: Negative for adenopathy.  Psychiatric/Behavioral: Negative for sleep disturbance and dysphoric mood. The patient is not nervous/anxious.        Objective:   Physical Exam  Constitutional: He is oriented to person, place, and time. He appears well-developed and well-nourished. No distress.  HENT:  Head: Normocephalic and atraumatic.  Right Ear: External ear normal.  Left Ear: External ear normal.  Nose: Nose normal.  Mouth/Throat: Oropharynx is clear and moist. No oropharyngeal exudate.  Eyes: Conjunctivae and EOM are normal. Pupils are equal, round, and reactive to light. Right eye exhibits no discharge. Left eye exhibits no discharge. No scleral icterus.  Neck: Normal range of motion. Neck supple. No tracheal deviation present. No thyromegaly present.  Cardiovascular: Normal rate, regular rhythm and normal heart sounds.  Exam reveals no gallop and no friction rub.   No murmur heard. Pulmonary/Chest: Effort normal and breath sounds normal. No respiratory distress. He has no wheezes. He has no rales. He exhibits no tenderness.  Musculoskeletal: Normal range of motion. He exhibits no edema.  Lymphadenopathy:    He has no cervical adenopathy.  Neurological: He is alert and oriented to person, place, and time. No cranial nerve deficit. Coordination normal.  Skin: Skin is warm and dry. No rash noted. He is not diaphoretic. No erythema. No pallor.  Psychiatric: He has a normal mood and affect. His behavior is normal. Judgment and thought content normal.          Assessment & Plan:

## 2013-02-28 NOTE — Assessment & Plan Note (Signed)
Will check lipids and LFTs with labs today. Continue atorvastatin. 

## 2013-02-28 NOTE — Assessment & Plan Note (Signed)
Symptomatically doing well. Continue levothyroxine. Check TSH with labs today.

## 2013-02-28 NOTE — Assessment & Plan Note (Signed)
Wt Readings from Last 3 Encounters:  02/28/13 253 lb (114.76 kg)  11/25/12 248 lb (112.492 kg)  08/26/12 247 lb (112.038 kg)   Weight has increased from previous visit. Patient notes he has been sedentary during the winter months. Encouraged increased physical activity and limitation of caloric intake.

## 2013-02-28 NOTE — Assessment & Plan Note (Signed)
BP Readings from Last 3 Encounters:  02/28/13 120/80  11/25/12 124/78  08/26/12 122/82   Blood pressures well-controlled on lisinopril hydrochlorothiazide. Will continue. Will check renal function with labs today.

## 2013-03-01 LAB — PSA, TOTAL AND FREE
PSA, Free Pct: 17 % — ABNORMAL LOW (ref >25)
PSA: 0.06 ng/mL (ref <=4.00)

## 2013-03-03 ENCOUNTER — Other Ambulatory Visit (INDEPENDENT_AMBULATORY_CARE_PROVIDER_SITE_OTHER): Payer: 59

## 2013-03-03 ENCOUNTER — Encounter: Payer: Self-pay | Admitting: *Deleted

## 2013-03-03 DIAGNOSIS — R739 Hyperglycemia, unspecified: Secondary | ICD-10-CM

## 2013-03-03 DIAGNOSIS — R7309 Other abnormal glucose: Secondary | ICD-10-CM

## 2013-03-03 LAB — HEMOGLOBIN A1C: Hgb A1c MFr Bld: 7 % — ABNORMAL HIGH (ref 4.6–6.5)

## 2013-03-18 ENCOUNTER — Ambulatory Visit (INDEPENDENT_AMBULATORY_CARE_PROVIDER_SITE_OTHER): Payer: 59 | Admitting: Internal Medicine

## 2013-03-18 ENCOUNTER — Encounter: Payer: Self-pay | Admitting: Internal Medicine

## 2013-03-18 VITALS — BP 134/74 | HR 66 | Temp 98.3°F | Wt 251.0 lb

## 2013-03-18 DIAGNOSIS — E669 Obesity, unspecified: Secondary | ICD-10-CM | POA: Insufficient documentation

## 2013-03-18 DIAGNOSIS — E1169 Type 2 diabetes mellitus with other specified complication: Secondary | ICD-10-CM | POA: Insufficient documentation

## 2013-03-18 DIAGNOSIS — E119 Type 2 diabetes mellitus without complications: Secondary | ICD-10-CM

## 2013-03-18 MED ORDER — METFORMIN HCL 500 MG PO TABS: 500.0000 mg | ORAL_TABLET | Freq: Two times a day (BID) | ORAL | Status: DC

## 2013-03-18 NOTE — Assessment & Plan Note (Signed)
Recent labs are consistent with diabetes with hemoglobin A1c of 7%. Discussed pathophysiology of diabetes. Discussed need to limit processed carbohydrates in the diet and increase physical activity with a goal of 30 minutes of walking 5 days per week. Discussed setting goal of 5% weight loss. Will start metformin 500 mg twice daily. Discussed the potential risk and benefits of this medication. Will have patient monitor blood sugars several times per week and record values. He will return to clinic in 4 weeks. Encouraged him to consider diabetes education to local pharmacy however he declines for now. Plan to repeat A1c in July 2014.  Over spent face to face with pt discussing diabetes and ongoing plan of care.

## 2013-03-18 NOTE — Patient Instructions (Addendum)
Check blood sugars 1-2 times per week. Follow up 1 month.  1800 Calorie Diet for Diabetes Meal Planning The 1800 calorie diet is designed for eating up to 1800 calories each day. Following this diet and making healthy meal choices can help improve overall health. This diet controls blood sugar (glucose) levels and can also help lower blood pressure and cholesterol. SERVING SIZES Measuring foods and serving sizes helps to make sure you are getting the right amount of food. The list below tells how big or small some common serving sizes are:  1 oz.........4 stacked dice.  3 oz........Marland KitchenDeck of cards.  1 tsp.......Marland KitchenTip of little finger.  1 tbs......Marland KitchenMarland KitchenThumb.  2 tbs.......Marland KitchenGolf ball.   cup......Marland KitchenHalf of a fist.  1 cup.......Marland KitchenA fist. GUIDELINES FOR CHOOSING FOODS The goal of this diet is to eat a variety of foods and limit calories to 1800 each day. This can be done by choosing foods that are low in calories and fat. The diet also suggests eating small amounts of food frequently. Doing this helps control your blood glucose levels so they do not get too high or too low. Each meal or snack may include a protein food source to help you feel more satisfied and to stabilize your blood glucose. Try to eat about the same amount of food around the same time each day. This includes weekend days, travel days, and days off work. Space your meals about 4 to 5 hours apart and add a snack between them if you wish.  For example, a daily food plan could include breakfast, a morning snack, lunch, dinner, and an evening snack. Healthy meals and snacks include whole grains, vegetables, fruits, lean meats, poultry, fish, and dairy products. As you plan your meals, select a variety of foods. Choose from the bread and starch, vegetable, fruit, dairy, and meat/protein groups. Examples of foods from each group and their suggested serving sizes are listed below. Use measuring cups and spoons to become familiar with what a  healthy portion looks like. Bread and Starch Each serving equals 15 grams of carbohydrates.  1 slice bread.   bagel.   cup cold cereal (unsweetened).   cup hot cereal or mashed potatoes.  1 small potato (size of a computer mouse).   cup cooked pasta or rice.   English muffin.  1 cup broth-based soup.  3 cups of popcorn.  4 to 6 whole-wheat crackers.   cup cooked beans, peas, or corn. Vegetable Each serving equals 5 grams of carbohydrates.   cup cooked vegetables.  1 cup raw vegetables.   cup tomato or vegetable juice. Fruit Each serving equals 15 grams of carbohydrates.  1 small apple or orange.  1 cup watermelon or strawberries.   cup applesauce (no sugar added).  2 tbs raisins.   banana.   cup canned fruit, packed in water, its own juice, or sweetened with a sugar substitute.   cup unsweetened fruit juice. Dairy Each serving equals 12 to 15 grams of carbohydrates.  1 cup fat-free milk.  6 oz artificially sweetened yogurt or plain yogurt.  1 cup low-fat buttermilk.  1 cup soy milk.  1 cup almond milk. Meat/Protein  1 large egg.  2 to 3 oz meat, poultry, or fish.   cup low-fat cottage cheese.  1 tbs peanut butter.  1 oz low-fat cheese.   cup tuna in water.   cup tofu. Fat  1 tsp oil.  1 tsp trans-fat-free margarine.  1 tsp butter.  1 tsp mayonnaise.  2  tbs avocado.  1 tbs salad dressing.  1 tbs cream cheese.  2 tbs sour cream. SAMPLE 1800 CALORIE DIET PLAN Breakfast   cup unsweetened cereal (1 carb serving).  1 cup fat-free milk (1 carb serving).  1 slice whole-wheat toast (1 carb serving).   small banana (1 carb serving).  1 scrambled egg.  1 tsp trans-fat-free margarine. Lunch  Tuna sandwich.  2 slices whole-wheat bread (2 carb servings).   cup canned tuna in water, drained.  1 tbs reduced fat mayonnaise.  1 stalk celery, chopped.  2 slices tomato.  1 lettuce leaf.  1 cup  carrot sticks.  24 to 30 seedless grapes (2 carb servings).  6 oz light yogurt (1 carb serving). Afternoon Snack  3 graham cracker squares (1 carb serving).  Fat-free milk, 1 cup (1 carb serving).  1 tbs peanut butter. Dinner  3 oz salmon, broiled with 1 tsp oil.  1 cup mashed potatoes (2 carb servings) with 1 tsp trans-fat-free margarine.  1 cup fresh or frozen green beans.  1 cup steamed asparagus.  1 cup fat-free milk (1 carb serving). Evening Snack  3 cups air-popped popcorn (1 carb serving).  2 tbs parmesan cheese sprinkled on top. MEAL PLAN Use this worksheet to help you make a daily meal plan based on the 1800 calorie diet suggestions. If you are using this plan to help you control your blood glucose, you may interchange carbohydrate-containing foods (dairy, starches, and fruits). Select a variety of fresh foods of varying colors and flavors. The total amount of carbohydrate in your meals or snacks is more important than making sure you include all of the food groups every time you eat. Choose from the following foods to build your day's meals:  8 Starches.  4 Vegetables.  3 Fruits.  2 Dairy.  6 to 7 oz Meat/Protein.  Up to 4 Fats. Your dietician can use this worksheet to help you decide how many servings and which types of foods are right for you. BREAKFAST Food Group and Servings / Food Choice Starch ________________________________________________________ Dairy _________________________________________________________ Fruit _________________________________________________________ Meat/Protein __________________________________________________ Fat ___________________________________________________________ LUNCH Food Group and Servings / Food Choice Starch ________________________________________________________ Meat/Protein __________________________________________________ Vegetable _____________________________________________________ Fruit  _________________________________________________________ Dairy _________________________________________________________ Fat ___________________________________________________________ Timothy Edwards Food Group and Servings / Food Choice Starch ________________________________________________________ Meat/Protein __________________________________________________ Fruit __________________________________________________________ Dairy _________________________________________________________ Timothy Edwards Food Group and Servings / Food Choice Starch _________________________________________________________ Meat/Protein ___________________________________________________ Dairy __________________________________________________________ Vegetable ______________________________________________________ Fruit ___________________________________________________________ Fat ____________________________________________________________ Timothy Edwards Food Group and Servings / Food Choice Fruit __________________________________________________________ Meat/Protein ___________________________________________________ Dairy __________________________________________________________ Starch _________________________________________________________ DAILY TOTALS Starch ____________________________ Vegetable _________________________ Fruit _____________________________ Dairy _____________________________ Meat/Protein______________________ Fat _______________________________ Document Released: 06/12/2005 Document Revised: 02/12/2012 Document Reviewed: 10/06/2011 [employer] Patient Information 2013 [place], [place].

## 2013-03-18 NOTE — Progress Notes (Signed)
  Subjective:    Patient ID: Timothy Edwards, male    DOB: [birth year], 67 y.o.   MRN: [id number]  HPI 67 year old male with history of hypertension, hypothyroidism, depression, hyperlipidemia presents for followup after recent lab showed elevated fasting sugar of 126 and elevated hemoglobin A1c of 7%. He has never been diagnosed with diabetes. He has been trying to improve his diet and lose weight. He has lost 3 pounds since his last visit. He notes some dietary indiscretion with increased intake of processed carbohydrates. He does not exercise on a regular basis.  Outpatient Encounter Prescriptions as of 03/18/2013  Medication Sig Dispense Refill  . atorvastatin (LIPITOR) 20 MG tablet Take 1 tablet (20 mg total) by mouth daily.  90 tablet  3  . cholecalciferol (VITAMIN D) 1000 UNITS tablet Take 1,000 Units by mouth daily.        . citalopram (CELEXA) 20 MG tablet Take 1 tablet (20 mg total) by mouth daily.  90 tablet  3  . levothyroxine (SYNTHROID) 50 MCG tablet Take 1 tablet (50 mcg total) by mouth daily.  90 tablet  1  . lisinopril-hydrochlorothiazide (PRINZIDE,ZESTORETIC) 10-12.5 MG per tablet Take 1 tablet by mouth daily.  90 tablet  1  . metFORMIN (GLUCOPHAGE) 500 MG tablet Take 1 tablet (500 mg total) by mouth 2 (two) times daily with a meal.  180 tablet  3   No facility-administered encounter medications on file as of 03/18/2013.   BP 134/74  Pulse 66  Temp(Src) 98.3 F (36.8 C) (Oral)  Wt 251 lb (113.853 kg)  BMI 35.02 kg/m2  SpO2 94%  Review of Systems  Constitutional: Negative for fever, chills, activity change, appetite change, fatigue and unexpected weight change.  Eyes: Negative for visual disturbance.  Respiratory: Negative for cough and shortness of breath.   Cardiovascular: Negative for chest pain, palpitations and leg swelling.  Gastrointestinal: Negative for abdominal pain and abdominal distention.  Genitourinary: Negative for dysuria, urgency and difficulty urinating.   Musculoskeletal: Negative for arthralgias and gait problem.  Skin: Negative for color change and rash.  Hematological: Negative for adenopathy.  Psychiatric/Behavioral: Negative for sleep disturbance and dysphoric mood. The patient is not nervous/anxious.        Objective:   Physical Exam  Constitutional: He is oriented to person, place, and time. He appears well-developed and well-nourished. No distress.  HENT:  Head: Normocephalic and atraumatic.  Right Ear: External ear normal.  Left Ear: External ear normal.  Nose: Nose normal.  Mouth/Throat: Oropharynx is clear and moist.  Eyes: Conjunctivae and EOM are normal. Pupils are equal, round, and reactive to light. Right eye exhibits no discharge. Left eye exhibits no discharge. No scleral icterus.  Neck: Normal range of motion.  Pulmonary/Chest: Effort normal.  Musculoskeletal: Normal range of motion. He exhibits no edema.  Neurological: He is alert and oriented to person, place, and time. No cranial nerve deficit. Coordination normal.  Skin: Skin is warm and dry. No rash noted. He is not diaphoretic. No erythema. No pallor.  Psychiatric: He has a normal mood and affect. His behavior is normal. Judgment and thought content normal.          Assessment & Plan:

## 2013-04-15 ENCOUNTER — Ambulatory Visit: Payer: 59 | Admitting: Internal Medicine

## 2013-05-05 ENCOUNTER — Encounter: Payer: Self-pay | Admitting: Internal Medicine

## 2013-05-05 ENCOUNTER — Ambulatory Visit (INDEPENDENT_AMBULATORY_CARE_PROVIDER_SITE_OTHER): Payer: 59 | Admitting: Internal Medicine

## 2013-05-05 VITALS — BP 130/64 | HR 65 | Temp 98.2°F | Ht 71.0 in | Wt 246.0 lb

## 2013-05-05 DIAGNOSIS — E669 Obesity, unspecified: Secondary | ICD-10-CM

## 2013-05-05 DIAGNOSIS — E1169 Type 2 diabetes mellitus with other specified complication: Secondary | ICD-10-CM

## 2013-05-05 DIAGNOSIS — E119 Type 2 diabetes mellitus without complications: Secondary | ICD-10-CM

## 2013-05-05 DIAGNOSIS — R413 Other amnesia: Secondary | ICD-10-CM

## 2013-05-05 NOTE — Progress Notes (Signed)
Subjective:    Patient ID: Timothy Edwards, male    DOB: [birth year], 67 y.o.   MRN: [id number]  HPI 67 year old male with history of diabetes, hypertension, hyperlipidemia presents for followup. In regards to diabetes, he reports blood sugars typically near 100 fasting. He denies any blood sugars over 120. He has been compliant with diet and has lost another 5 pounds. He is also been taking metformin however notes that he occasionally forgets his daytime dose.  He is concerned that forgetting his daytime dose of metformin may reflect memory loss or early dementia. He also notes occasionally forgetting what size wrench he needs when working in his garage. He reports that his wife has told him that she thinks these are normal findings. He is able to manage his finances. He has no trouble finding locations when traveling. He continues to work on crossword puzzles.  Outpatient Encounter Prescriptions as of 05/05/2013  Medication Sig Dispense Refill  . atorvastatin (LIPITOR) 20 MG tablet Take 1 tablet (20 mg total) by mouth daily.  90 tablet  3  . cholecalciferol (VITAMIN D) 1000 UNITS tablet Take 1,000 Units by mouth daily.        . citalopram (CELEXA) 20 MG tablet Take 1 tablet (20 mg total) by mouth daily.  90 tablet  3  . levothyroxine (SYNTHROID) 50 MCG tablet Take 1 tablet (50 mcg total) by mouth daily.  90 tablet  1  . lisinopril-hydrochlorothiazide (PRINZIDE,ZESTORETIC) 10-12.5 MG per tablet Take 1 tablet by mouth daily.  90 tablet  1  . metFORMIN (GLUCOPHAGE) 500 MG tablet Take 1 tablet (500 mg total) by mouth 2 (two) times daily with a meal.  180 tablet  3   No facility-administered encounter medications on file as of 05/05/2013.   BP 130/64  Pulse 65  Temp(Src) 98.2 F (36.8 C) (Oral)  Ht 5\' 11"  (1.803 m)  Wt 246 lb (111.585 kg)  BMI 34.33 kg/m2  SpO2 96%  Review of Systems  Constitutional: Negative for fever, chills, activity change, appetite change, fatigue and unexpected weight  change.  Eyes: Negative for visual disturbance.  Respiratory: Negative for cough and shortness of breath.   Cardiovascular: Negative for chest pain, palpitations and leg swelling.  Gastrointestinal: Negative for abdominal pain and abdominal distention.  Genitourinary: Negative for dysuria, urgency and difficulty urinating.  Musculoskeletal: Negative for arthralgias and gait problem.  Skin: Negative for color change and rash.  Hematological: Negative for adenopathy.  Psychiatric/Behavioral: Negative for sleep disturbance and dysphoric mood. The patient is not nervous/anxious.        Objective:   Physical Exam  Constitutional: He is oriented to person, place, and time. He appears well-developed and well-nourished. No distress.  HENT:  Head: Normocephalic and atraumatic.  Right Ear: External ear normal.  Left Ear: External ear normal.  Nose: Nose normal.  Mouth/Throat: Oropharynx is clear and moist. No oropharyngeal exudate.  Eyes: Conjunctivae and EOM are normal. Pupils are equal, round, and reactive to light. Right eye exhibits no discharge. Left eye exhibits no discharge. No scleral icterus.  Neck: Normal range of motion. Neck supple. No tracheal deviation present. No thyromegaly present.  Cardiovascular: Normal rate, regular rhythm and normal heart sounds.  Exam reveals no gallop and no friction rub.   No murmur heard. Pulmonary/Chest: Effort normal and breath sounds normal. No respiratory distress. He has no wheezes. He has no rales. He exhibits no tenderness.  Musculoskeletal: Normal range of motion. He exhibits no edema.  Lymphadenopathy:  He has no cervical adenopathy.  Neurological: He is alert and oriented to person, place, and time. No cranial nerve deficit. Coordination normal.  Skin: Skin is warm and dry. No rash noted. He is not diaphoretic. No erythema. No pallor.  Psychiatric: He has a normal mood and affect. His behavior is normal. Judgment and thought content normal.           Assessment & Plan:

## 2013-05-05 NOTE — Assessment & Plan Note (Signed)
Blood sugars well-controlled per patient. Will plan to check A1c with labs in July 2014. Continue metformin.

## 2013-05-05 NOTE — Assessment & Plan Note (Signed)
Patient concerned about symptoms of memory loss. However, symptoms he describes are consistent with normal cognition. Exam is normal. Offered encouragement today. Discussed potential cognitive testing in the future if symptoms change.

## 2013-06-03 ENCOUNTER — Other Ambulatory Visit (INDEPENDENT_AMBULATORY_CARE_PROVIDER_SITE_OTHER): Payer: 59

## 2013-06-03 DIAGNOSIS — E669 Obesity, unspecified: Secondary | ICD-10-CM

## 2013-06-03 DIAGNOSIS — E1169 Type 2 diabetes mellitus with other specified complication: Secondary | ICD-10-CM

## 2013-06-03 DIAGNOSIS — E119 Type 2 diabetes mellitus without complications: Secondary | ICD-10-CM

## 2013-06-03 LAB — COMPREHENSIVE METABOLIC PANEL
ALT: 29 U/L (ref 0–53)
AST: 28 U/L (ref 0–37)
BUN: 11 mg/dL (ref 6–23)
CO2: 31 mEq/L (ref 19–32)
Creatinine, Ser: 0.8 mg/dL (ref 0.4–1.5)
GFR: 99.08 mL/min (ref >60.00)
Total Bilirubin: 0.5 mg/dL (ref 0.3–1.2)

## 2013-06-09 ENCOUNTER — Encounter: Payer: Self-pay | Admitting: Internal Medicine

## 2013-06-09 ENCOUNTER — Ambulatory Visit (INDEPENDENT_AMBULATORY_CARE_PROVIDER_SITE_OTHER): Payer: 59 | Admitting: Internal Medicine

## 2013-06-09 VITALS — BP 150/78 | HR 79 | Temp 98.3°F | Wt 244.0 lb

## 2013-06-09 DIAGNOSIS — E1169 Type 2 diabetes mellitus with other specified complication: Secondary | ICD-10-CM

## 2013-06-09 DIAGNOSIS — E119 Type 2 diabetes mellitus without complications: Secondary | ICD-10-CM

## 2013-06-09 DIAGNOSIS — I10 Essential (primary) hypertension: Secondary | ICD-10-CM

## 2013-06-09 DIAGNOSIS — Z1211 Encounter for screening for malignant neoplasm of colon: Secondary | ICD-10-CM

## 2013-06-09 DIAGNOSIS — E669 Obesity, unspecified: Secondary | ICD-10-CM

## 2013-06-09 DIAGNOSIS — Z23 Encounter for immunization: Secondary | ICD-10-CM

## 2013-06-09 NOTE — Progress Notes (Signed)
Subjective:    Patient ID: KINTE TRIM, male    DOB: 12/07/1945, 67 y.o.   MRN: [id number]  HPI 67 year old male with history of obesity, hypertension, diabetes, hyperlipidemia, hypothyroidism presents for followup. Over the last month, he has been taking metformin to better control his blood sugar. He reports blood sugar has been well-controlled but did not bring record with him today. He is trying to follow a healthy diet and has lost another 2 pounds. He notes some dietary indiscretion this weekend over the holidays and increased intake of high salt foods.  Outpatient Encounter Prescriptions as of 06/09/2013  Medication Sig Dispense Refill  . atorvastatin (LIPITOR) 20 MG tablet Take 1 tablet (20 mg total) by mouth daily.  90 tablet  3  . cholecalciferol (VITAMIN D) 1000 UNITS tablet Take 1,000 Units by mouth daily.        . citalopram (CELEXA) 20 MG tablet Take 1 tablet (20 mg total) by mouth daily.  90 tablet  3  . levothyroxine (SYNTHROID) 50 MCG tablet Take 1 tablet (50 mcg total) by mouth daily.  90 tablet  1  . lisinopril-hydrochlorothiazide (PRINZIDE,ZESTORETIC) 10-12.5 MG per tablet Take 1 tablet by mouth daily.  90 tablet  1  . metFORMIN (GLUCOPHAGE) 500 MG tablet Take 1 tablet (500 mg total) by mouth 2 (two) times daily with a meal.  180 tablet  3   No facility-administered encounter medications on file as of 06/09/2013.   BP 150/78  Pulse 79  Temp(Src) 98.3 F (36.8 C) (Oral)  Wt 244 lb (110.678 kg)  BMI 34.05 kg/m2  SpO2 96%  Review of Systems  Constitutional: Negative for fever, chills, activity change, appetite change, fatigue and unexpected weight change.  Eyes: Negative for visual disturbance.  Respiratory: Negative for cough and shortness of breath.   Cardiovascular: Negative for chest pain, palpitations and leg swelling.  Gastrointestinal: Negative for abdominal pain and abdominal distention.  Genitourinary: Negative for dysuria, urgency and difficulty urinating.   Musculoskeletal: Negative for arthralgias and gait problem.  Skin: Negative for color change and rash.  Hematological: Negative for adenopathy.  Psychiatric/Behavioral: Negative for sleep disturbance and dysphoric mood. The patient is not nervous/anxious.        Objective:   Physical Exam  Constitutional: He is oriented to person, place, and time. He appears well-developed and well-nourished. No distress.  HENT:  Head: Normocephalic and atraumatic.  Right Ear: External ear normal.  Left Ear: External ear normal.  Nose: Nose normal.  Mouth/Throat: Oropharynx is clear and moist. No oropharyngeal exudate.  Eyes: Conjunctivae and EOM are normal. Pupils are equal, round, and reactive to light. Right eye exhibits no discharge. Left eye exhibits no discharge. No scleral icterus.  Neck: Normal range of motion. Neck supple. No tracheal deviation present. No thyromegaly present.  Cardiovascular: Normal rate, regular rhythm and normal heart sounds.  Exam reveals no gallop and no friction rub.   No murmur heard. Pulmonary/Chest: Effort normal and breath sounds normal. No respiratory distress. He has no wheezes. He has no rales. He exhibits no tenderness.  Musculoskeletal: Normal range of motion. He exhibits no edema.  Lymphadenopathy:    He has no cervical adenopathy.  Neurological: He is alert and oriented to person, place, and time. No cranial nerve deficit. Coordination normal.  Skin: Skin is warm and dry. No rash noted. He is not diaphoretic. No erythema. No pallor.  Psychiatric: He has a normal mood and affect. His behavior is normal. Judgment and thought content normal.  Assessment & Plan:

## 2013-06-09 NOTE — Assessment & Plan Note (Signed)
Wt Readings from Last 3 Encounters:  06/09/13 244 lb (110.678 kg)  05/05/13 246 lb (111.585 kg)  03/18/13 251 lb (113.853 kg)   Congratulated patient on weight loss. Encouraged him to continue efforts at healthy diet and regular physical activity.

## 2013-06-09 NOTE — Assessment & Plan Note (Signed)
BP Readings from Last 3 Encounters:  06/09/13 150/78  05/05/13 130/64  03/18/13 134/74   Blood pressure slightly elevated today however patient has had increased salt intake over the weekend. Encouraged better compliance with low-sodium diet. Continue current medications.

## 2013-06-09 NOTE — Assessment & Plan Note (Signed)
Lab Results  Component Value Date   HGBA1C 6.7* 06/03/2013   Congratulated patient on improved control of blood sugar. Will continue current medication. Encouraged compliance with healthy diet low in processed carbohydrates.

## 2013-06-23 ENCOUNTER — Other Ambulatory Visit: Payer: Self-pay | Admitting: *Deleted

## 2013-06-23 DIAGNOSIS — I10 Essential (primary) hypertension: Secondary | ICD-10-CM

## 2013-06-23 DIAGNOSIS — E039 Hypothyroidism, unspecified: Secondary | ICD-10-CM

## 2013-06-23 MED ORDER — LISINOPRIL-HYDROCHLOROTHIAZIDE 10-12.5 MG PO TABS: 1.0000 | ORAL_TABLET | Freq: Every day | ORAL | Status: DC

## 2013-06-23 MED ORDER — LEVOTHYROXINE SODIUM 50 MCG PO TABS: 50.0000 ug | ORAL_TABLET | Freq: Every day | ORAL | Status: DC

## 2013-06-23 NOTE — Telephone Encounter (Signed)
Eprescribed.

## 2013-09-02 ENCOUNTER — Ambulatory Visit: Payer: Self-pay | Admitting: Internal Medicine

## 2013-09-24 ENCOUNTER — Other Ambulatory Visit: Payer: 59

## 2013-09-30 ENCOUNTER — Other Ambulatory Visit (INDEPENDENT_AMBULATORY_CARE_PROVIDER_SITE_OTHER): Payer: 59

## 2013-09-30 DIAGNOSIS — E1169 Type 2 diabetes mellitus with other specified complication: Secondary | ICD-10-CM

## 2013-09-30 DIAGNOSIS — E119 Type 2 diabetes mellitus without complications: Secondary | ICD-10-CM

## 2013-09-30 DIAGNOSIS — E669 Obesity, unspecified: Secondary | ICD-10-CM

## 2013-09-30 LAB — COMPREHENSIVE METABOLIC PANEL
AST: 36 U/L (ref 0–37)
Albumin: 4.1 g/dL (ref 3.5–5.2)
Alkaline Phosphatase: 62 U/L (ref 39–117)
Calcium: 9.5 mg/dL (ref 8.4–10.5)
Chloride: 100 mEq/L (ref 96–112)
Creatinine, Ser: 0.7 mg/dL (ref 0.4–1.5)
GFR: 113.19 mL/min (ref >60.00)
Glucose, Bld: 130 mg/dL — ABNORMAL HIGH (ref 70–99)
Potassium: 4.5 mEq/L (ref 3.5–5.1)
Sodium: 138 mEq/L (ref 135–145)
Total Bilirubin: 0.8 mg/dL (ref 0.3–1.2)
Total Protein: 7.2 g/dL (ref 6.0–8.3)

## 2013-09-30 LAB — HEMOGLOBIN A1C: Hgb A1c MFr Bld: 7 % — ABNORMAL HIGH (ref 4.6–6.5)

## 2013-10-01 ENCOUNTER — Encounter: Payer: Self-pay | Admitting: Internal Medicine

## 2013-10-01 ENCOUNTER — Ambulatory Visit (INDEPENDENT_AMBULATORY_CARE_PROVIDER_SITE_OTHER): Payer: 59 | Admitting: Internal Medicine

## 2013-10-01 VITALS — BP 120/68 | HR 64 | Temp 98.0°F | Wt 251.0 lb

## 2013-10-01 DIAGNOSIS — I10 Essential (primary) hypertension: Secondary | ICD-10-CM

## 2013-10-01 DIAGNOSIS — E1169 Type 2 diabetes mellitus with other specified complication: Secondary | ICD-10-CM

## 2013-10-01 DIAGNOSIS — E669 Obesity, unspecified: Secondary | ICD-10-CM

## 2013-10-01 DIAGNOSIS — E119 Type 2 diabetes mellitus without complications: Secondary | ICD-10-CM

## 2013-10-01 NOTE — Assessment & Plan Note (Signed)
Wt Readings from Last 3 Encounters:  10/01/13 251 lb (113.853 kg)  06/09/13 244 lb (110.678 kg)  05/05/13 246 lb (111.585 kg)   Body mass index is 35.02 kg/(m^2).  Encouraged limitation of carbohydrate intake, particularly processed carbohydrates. Encouraged regular physical activity with goal of exercise daily.

## 2013-10-01 NOTE — Assessment & Plan Note (Signed)
BP Readings from Last 3 Encounters:  10/01/13 120/68  06/09/13 150/78  05/05/13 130/64   BP well controlled on current medications. Will continue.

## 2013-10-01 NOTE — Assessment & Plan Note (Signed)
Lab Results  Component Value Date   HGBA1C 7.0* 09/30/2013   BG generally well controlled on current medication. Encouraged low-glycemic index diet and regular physical activity with goal of walking daily. Plan repeat A1c in 3 months.

## 2013-10-01 NOTE — Progress Notes (Signed)
  Subjective:    Patient ID: Timothy Edwards, male    DOB: 09-11-1946, 67 y.o.   MRN: [id number]  HPI 67YO male with DM, HTN, and obesity presents for follow up. Doing well. Does not generally check BG. Compliant with medications. Notes some dietary indiscretion, with increased intake of foods such as apple pie, cookies. Does not exercise, however is very active around his home.  Outpatient Encounter Prescriptions as of 10/01/2013  Medication Sig Dispense Refill  . atorvastatin (LIPITOR) 20 MG tablet Take 1 tablet (20 mg total) by mouth daily.  90 tablet  3  . cholecalciferol (VITAMIN D) 1000 UNITS tablet Take 1,000 Units by mouth daily.        . citalopram (CELEXA) 20 MG tablet Take 1 tablet (20 mg total) by mouth daily.  90 tablet  3  . levothyroxine (SYNTHROID) 50 MCG tablet Take 1 tablet (50 mcg total) by mouth daily.  90 tablet  1  . lisinopril-hydrochlorothiazide (PRINZIDE,ZESTORETIC) 10-12.5 MG per tablet Take 1 tablet by mouth daily.  90 tablet  1  . metFORMIN (GLUCOPHAGE) 500 MG tablet Take 1 tablet (500 mg total) by mouth 2 (two) times daily with a meal.  180 tablet  3   No facility-administered encounter medications on file as of 10/01/2013.   BP 120/68  Pulse 64  Temp(Src) 98 F (36.7 C) (Oral)  Wt 251 lb (113.853 kg)  BMI 35.02 kg/m2  SpO2 94%  Review of Systems  Constitutional: Negative for fever, chills, activity change, appetite change, fatigue and unexpected weight change.  Eyes: Negative for visual disturbance.  Respiratory: Negative for cough and shortness of breath.   Cardiovascular: Negative for chest pain, palpitations and leg swelling.  Gastrointestinal: Negative for abdominal pain and abdominal distention.  Genitourinary: Negative for dysuria, urgency and difficulty urinating.  Musculoskeletal: Negative for arthralgias and gait problem.  Skin: Negative for color change and rash.  Hematological: Negative for adenopathy.  Psychiatric/Behavioral: Negative for  sleep disturbance and dysphoric mood. The patient is not nervous/anxious.        Objective:   Physical Exam  Constitutional: He is oriented to person, place, and time. He appears well-developed and well-nourished. No distress.  HENT:  Head: Normocephalic and atraumatic.  Right Ear: External ear normal.  Left Ear: External ear normal.  Nose: Nose normal.  Mouth/Throat: Oropharynx is clear and moist. No oropharyngeal exudate.  Eyes: Conjunctivae and EOM are normal. Pupils are equal, round, and reactive to light. Right eye exhibits no discharge. Left eye exhibits no discharge. No scleral icterus.  Neck: Normal range of motion. Neck supple. No tracheal deviation present. No thyromegaly present.  Cardiovascular: Normal rate, regular rhythm and normal heart sounds.  Exam reveals no gallop and no friction rub.   No murmur heard. Pulmonary/Chest: Effort normal and breath sounds normal. No respiratory distress. He has no wheezes. He has no rales. He exhibits no tenderness.  Musculoskeletal: Normal range of motion. He exhibits no edema.  Lymphadenopathy:    He has no cervical adenopathy.  Neurological: He is alert and oriented to person, place, and time. No cranial nerve deficit. Coordination normal.  Skin: Skin is warm and dry. No rash noted. He is not diaphoretic. No erythema. No pallor.  Psychiatric: He has a normal mood and affect. His behavior is normal. Judgment and thought content normal.          Assessment & Plan:

## 2013-10-03 ENCOUNTER — Telehealth: Payer: Self-pay | Admitting: Internal Medicine

## 2013-10-03 MED ORDER — CLOBETASOL PROPIONATE 0.05 % EX OINT: TOPICAL_OINTMENT | Freq: Two times a day (BID) | CUTANEOUS | Status: DC

## 2013-10-03 NOTE — Telephone Encounter (Signed)
I have called this in for him

## 2013-10-03 NOTE — Telephone Encounter (Signed)
Fwd to Dr. Walker 

## 2013-10-03 NOTE — Telephone Encounter (Addendum)
Clobetasol Propionate ointment usp , 0.05% is the name of the cream for his psoriasis    The patient's wife uses a pro form treadmill.

## 2013-10-09 ENCOUNTER — Other Ambulatory Visit: Payer: Self-pay

## 2013-12-08 ENCOUNTER — Other Ambulatory Visit: Payer: Self-pay | Admitting: Internal Medicine

## 2013-12-29 ENCOUNTER — Other Ambulatory Visit: Payer: Self-pay | Admitting: Internal Medicine

## 2013-12-31 ENCOUNTER — Ambulatory Visit: Payer: 59 | Admitting: Internal Medicine

## 2014-01-06 ENCOUNTER — Other Ambulatory Visit (INDEPENDENT_AMBULATORY_CARE_PROVIDER_SITE_OTHER): Payer: 59

## 2014-01-06 DIAGNOSIS — E1169 Type 2 diabetes mellitus with other specified complication: Secondary | ICD-10-CM

## 2014-01-06 DIAGNOSIS — E119 Type 2 diabetes mellitus without complications: Secondary | ICD-10-CM

## 2014-01-06 DIAGNOSIS — E669 Obesity, unspecified: Secondary | ICD-10-CM

## 2014-01-06 DIAGNOSIS — I10 Essential (primary) hypertension: Secondary | ICD-10-CM

## 2014-01-06 LAB — COMPREHENSIVE METABOLIC PANEL
ALBUMIN: 4 g/dL (ref 3.5–5.2)
ALK PHOS: 70 U/L (ref 39–117)
ALT: 49 U/L (ref 0–53)
AST: 42 U/L — ABNORMAL HIGH (ref 0–37)
BUN: 11 mg/dL (ref 6–23)
CO2: 26 mEq/L (ref 19–32)
CREATININE: 0.8 mg/dL (ref 0.4–1.5)
Calcium: 9.7 mg/dL (ref 8.4–10.5)
Chloride: 100 mEq/L (ref 96–112)
GFR: 96.19 mL/min (ref >60.00)
Glucose, Bld: 118 mg/dL — ABNORMAL HIGH (ref 70–99)
POTASSIUM: 4.1 meq/L (ref 3.5–5.1)
Sodium: 136 mEq/L (ref 135–145)
Total Bilirubin: 0.9 mg/dL (ref 0.3–1.2)
Total Protein: 7 g/dL (ref 6.0–8.3)

## 2014-01-06 LAB — HEMOGLOBIN A1C: Hgb A1c MFr Bld: 7.3 % — ABNORMAL HIGH (ref 4.6–6.5)

## 2014-01-07 ENCOUNTER — Encounter: Payer: Self-pay | Admitting: *Deleted

## 2014-01-08 ENCOUNTER — Ambulatory Visit (INDEPENDENT_AMBULATORY_CARE_PROVIDER_SITE_OTHER): Payer: 59 | Admitting: Internal Medicine

## 2014-01-08 ENCOUNTER — Encounter: Payer: Self-pay | Admitting: Internal Medicine

## 2014-01-08 VITALS — BP 124/70 | HR 78 | Temp 98.2°F | Wt 256.0 lb

## 2014-01-08 DIAGNOSIS — E119 Type 2 diabetes mellitus without complications: Secondary | ICD-10-CM

## 2014-01-08 DIAGNOSIS — E039 Hypothyroidism, unspecified: Secondary | ICD-10-CM

## 2014-01-08 DIAGNOSIS — F3289 Other specified depressive episodes: Secondary | ICD-10-CM

## 2014-01-08 DIAGNOSIS — E669 Obesity, unspecified: Secondary | ICD-10-CM

## 2014-01-08 DIAGNOSIS — F32A Depression, unspecified: Secondary | ICD-10-CM

## 2014-01-08 DIAGNOSIS — F329 Major depressive disorder, single episode, unspecified: Secondary | ICD-10-CM

## 2014-01-08 DIAGNOSIS — E785 Hyperlipidemia, unspecified: Secondary | ICD-10-CM

## 2014-01-08 DIAGNOSIS — I10 Essential (primary) hypertension: Secondary | ICD-10-CM

## 2014-01-08 DIAGNOSIS — E1169 Type 2 diabetes mellitus with other specified complication: Secondary | ICD-10-CM

## 2014-01-08 NOTE — Assessment & Plan Note (Signed)
Wt Readings from Last 3 Encounters:  01/08/14 256 lb (116.121 kg)  10/01/13 251 lb (113.853 kg)  06/09/13 244 lb (110.678 kg)   Encouraged healthy diet and regular exercise in effort to lose weight. Discussed goal of 10min walking daily to get started. Follow up 3 months and prn.

## 2014-01-08 NOTE — Progress Notes (Signed)
Subjective:    Patient ID: Timothy Edwards, male    DOB: 1946-01-29, 68 y.o.   MRN: [id number]  HPI 68YO male with h/o HTN, HL, DM, hypothyroidism, and depression presents for follow up.  Feeling well. Notes some dietary indiscretion over Holidays. Now being more careful about diet. Does not check BG at home. Compliant with medications. Not exercising.  Review of Systems  Constitutional: Negative for fever, chills, activity change, appetite change, fatigue and unexpected weight change.  Eyes: Negative for visual disturbance.  Respiratory: Negative for cough and shortness of breath.   Cardiovascular: Negative for chest pain, palpitations and leg swelling.  Gastrointestinal: Negative for abdominal pain and abdominal distention.  Genitourinary: Negative for dysuria, urgency and difficulty urinating.  Musculoskeletal: Negative for arthralgias and gait problem.  Skin: Negative for color change and rash.  Hematological: Negative for adenopathy.  Psychiatric/Behavioral: Negative for sleep disturbance and dysphoric mood. The patient is not nervous/anxious.        Objective:    BP 124/70  Pulse 78  Temp(Src) 98.2 F (36.8 C) (Oral)  Wt 256 lb (116.121 kg)  SpO2 95% Physical Exam  Constitutional: He is oriented to person, place, and time. He appears well-developed and well-nourished. No distress.  HENT:  Head: Normocephalic and atraumatic.  Right Ear: External ear normal.  Left Ear: External ear normal.  Nose: Nose normal.  Mouth/Throat: Oropharynx is clear and moist. No oropharyngeal exudate.  Eyes: Conjunctivae and EOM are normal. Pupils are equal, round, and reactive to light. Right eye exhibits no discharge. Left eye exhibits no discharge. No scleral icterus.  Neck: Normal range of motion. Neck supple. No tracheal deviation present. No thyromegaly present.  Cardiovascular: Normal rate, regular rhythm and normal heart sounds.  Exam reveals no gallop and no friction rub.   No  murmur heard. Pulmonary/Chest: Effort normal and breath sounds normal. No accessory muscle usage. Not tachypneic. No respiratory distress. He has no decreased breath sounds. He has no wheezes. He has no rhonchi. He has no rales. He exhibits no tenderness.  Musculoskeletal: Normal range of motion. He exhibits no edema.  Lymphadenopathy:    He has no cervical adenopathy.  Neurological: He is alert and oriented to person, place, and time. No cranial nerve deficit. Coordination normal.  Skin: Skin is warm and dry. No rash noted. He is not diaphoretic. No erythema. No pallor.  Psychiatric: He has a normal mood and affect. His behavior is normal. Judgment and thought content normal.          Assessment & Plan:   Problem List Items Addressed This Visit   Depression     Symptoms well controlled on Citalopram. Will continue.    Diabetes mellitus type 2 in obese - Primary      Lab Results  Component Value Date   HGBA1C 7.3* 01/06/2014   BG slightly increased compared to previous. Encouraged compliance with metformin and with healthy diet. Encouraged setting goal of exercise daily. Follow up 3 months.    Hyperlipidemia     Continue Atorvastatin. Encouraged healthy diet and exercise with goal of weight loss.    Hypertension      BP Readings from Last 3 Encounters:  01/08/14 124/70  10/01/13 120/68  06/09/13 150/78   BP well controlled on lisinopril-HCTZ. Renal function stable. Will continue.    Hypothyroidism     Symptomatically doing well. Continue Levothyroxine.    Obesity (BMI 30-39.9)      Wt Readings from Last 3 Encounters:  01/08/14 256 lb (116.121 kg)  10/01/13 251 lb (113.853 kg)  06/09/13 244 lb (110.678 kg)   Encouraged healthy diet and regular exercise in effort to lose weight. Discussed goal of 10min walking daily to get started. Follow up 3 months and prn.        Return in about 3 months (around 04/07/2014) for Recheck of Diabetes.

## 2014-01-08 NOTE — Progress Notes (Signed)
Pre-visit discussion using our clinic review tool. No additional management support is needed unless otherwise documented below in the visit note.  

## 2014-01-08 NOTE — Assessment & Plan Note (Signed)
Symptomatically doing well. Continue Levothyroxine. 

## 2014-01-08 NOTE — Assessment & Plan Note (Signed)
Lab Results  Component Value Date   HGBA1C 7.3* 01/06/2014   BG slightly increased compared to previous. Encouraged compliance with metformin and with healthy diet. Encouraged setting goal of exercise 10min daily. Follow up 3 months.

## 2014-01-08 NOTE — Assessment & Plan Note (Signed)
Continue Atorvastatin. Encouraged healthy diet and exercise with goal of weight loss.

## 2014-01-08 NOTE — Assessment & Plan Note (Signed)
BP Readings from Last 3 Encounters:  01/08/14 124/70  10/01/13 120/68  06/09/13 150/78   BP well controlled on lisinopril-HCTZ. Renal function stable. Will continue.

## 2014-01-08 NOTE — Assessment & Plan Note (Signed)
Symptoms well controlled on Citalopram. Will continue.

## 2014-01-09 ENCOUNTER — Telehealth: Payer: Self-pay | Admitting: Internal Medicine

## 2014-01-09 ENCOUNTER — Telehealth: Payer: Self-pay

## 2014-01-09 NOTE — Telephone Encounter (Signed)
Relevant patient education assigned to patient using Emmi. ° °

## 2014-03-12 ENCOUNTER — Other Ambulatory Visit: Payer: Self-pay | Admitting: Internal Medicine

## 2014-03-31 ENCOUNTER — Other Ambulatory Visit: Payer: Self-pay | Admitting: Internal Medicine

## 2014-04-15 ENCOUNTER — Other Ambulatory Visit: Payer: 59

## 2014-04-16 ENCOUNTER — Other Ambulatory Visit (INDEPENDENT_AMBULATORY_CARE_PROVIDER_SITE_OTHER): Payer: 59

## 2014-04-16 ENCOUNTER — Telehealth: Payer: Self-pay | Admitting: *Deleted

## 2014-04-16 DIAGNOSIS — E039 Hypothyroidism, unspecified: Secondary | ICD-10-CM

## 2014-04-16 DIAGNOSIS — E119 Type 2 diabetes mellitus without complications: Secondary | ICD-10-CM

## 2014-04-16 LAB — COMPREHENSIVE METABOLIC PANEL
ALT: 59 U/L — ABNORMAL HIGH (ref 0–53)
AST: 56 U/L — ABNORMAL HIGH (ref 0–37)
Albumin: 3.9 g/dL (ref 3.5–5.2)
Alkaline Phosphatase: 63 U/L (ref 39–117)
BUN: 12 mg/dL (ref 6–23)
CALCIUM: 9.4 mg/dL (ref 8.4–10.5)
CHLORIDE: 101 meq/L (ref 96–112)
CO2: 28 meq/L (ref 19–32)
CREATININE: 0.8 mg/dL (ref 0.4–1.5)
GFR: 101.68 mL/min (ref >60.00)
Glucose, Bld: 104 mg/dL — ABNORMAL HIGH (ref 70–99)
Potassium: 4.4 mEq/L (ref 3.5–5.1)
Sodium: 137 mEq/L (ref 135–145)
Total Bilirubin: 1.2 mg/dL (ref 0.2–1.2)
Total Protein: 6.5 g/dL (ref 6.0–8.3)

## 2014-04-16 LAB — LIPID PANEL
CHOLESTEROL: 148 mg/dL (ref 0–200)
HDL: 47.6 mg/dL (ref >39.00)
LDL Cholesterol: 85 mg/dL (ref 0–99)
TRIGLYCERIDES: 77 mg/dL (ref 0.0–149.0)
Total CHOL/HDL Ratio: 3
VLDL: 15.4 mg/dL (ref 0.0–40.0)

## 2014-04-16 LAB — HEMOGLOBIN A1C: HEMOGLOBIN A1C: 7.2 % — AB (ref 4.6–6.5)

## 2014-04-16 LAB — TSH: TSH: 1.71 u[IU]/mL (ref 0.35–4.50)

## 2014-04-16 NOTE — Telephone Encounter (Signed)
CMP, lipids, A1c, TSH 244.9, 250.00

## 2014-04-16 NOTE — Telephone Encounter (Signed)
What labs and dX?  

## 2014-04-17 ENCOUNTER — Encounter: Payer: Self-pay | Admitting: Internal Medicine

## 2014-04-17 ENCOUNTER — Ambulatory Visit (INDEPENDENT_AMBULATORY_CARE_PROVIDER_SITE_OTHER): Payer: 59 | Admitting: Internal Medicine

## 2014-04-17 VITALS — BP 126/68 | HR 76 | Temp 98.1°F | Wt 256.2 lb

## 2014-04-17 DIAGNOSIS — E119 Type 2 diabetes mellitus without complications: Secondary | ICD-10-CM

## 2014-04-17 DIAGNOSIS — E039 Hypothyroidism, unspecified: Secondary | ICD-10-CM

## 2014-04-17 DIAGNOSIS — E785 Hyperlipidemia, unspecified: Secondary | ICD-10-CM

## 2014-04-17 DIAGNOSIS — I10 Essential (primary) hypertension: Secondary | ICD-10-CM

## 2014-04-17 DIAGNOSIS — E1169 Type 2 diabetes mellitus with other specified complication: Secondary | ICD-10-CM

## 2014-04-17 DIAGNOSIS — E669 Obesity, unspecified: Secondary | ICD-10-CM

## 2014-04-17 LAB — HM COLONOSCOPY

## 2014-04-17 NOTE — Assessment & Plan Note (Signed)
BP Readings from Last 3 Encounters:  04/17/14 126/68  01/08/14 124/70  10/01/13 120/68   BP well controlled with Lisinopril-HCTZ. Will continue.

## 2014-04-17 NOTE — Progress Notes (Signed)
Pre visit review using our clinic review tool, if applicable. No additional management support is needed unless otherwise documented below in the visit note. 

## 2014-04-17 NOTE — Assessment & Plan Note (Signed)
Wt Readings from Last 3 Encounters:  04/17/14 256 lb 4 oz (116.234 kg)  01/08/14 256 lb (116.121 kg)  10/01/13 251 lb (113.853 kg)   Encouraged healthy diet and exercise with goal of weight loss.

## 2014-04-17 NOTE — Progress Notes (Signed)
Subjective:    Patient ID: Timothy Edwards, male    DOB: 05/10/1946, 68 y.o.   MRN: [id number]  HPI 68YO male presents for follow up.  DM - Does not generally check BG. Compliant with medication. A1c recent check. 7.2%. No new concerns today. Feeling well. Trying to stay active and follow healthy diet.  Review of Systems  Constitutional: Negative for fever, chills, activity change, appetite change, fatigue and unexpected weight change.  Eyes: Negative for visual disturbance.  Respiratory: Negative for cough and shortness of breath.   Cardiovascular: Negative for chest pain, palpitations and leg swelling.  Gastrointestinal: Negative for abdominal pain and abdominal distention.  Genitourinary: Negative for dysuria, urgency and difficulty urinating.  Musculoskeletal: Negative for arthralgias and gait problem.  Skin: Negative for color change and rash.  Hematological: Negative for adenopathy.  Psychiatric/Behavioral: Negative for sleep disturbance and dysphoric mood. The patient is not nervous/anxious.        Objective:    BP 126/68  Pulse 76  Temp(Src) 98.1 F (36.7 C) (Oral)  Wt 256 lb 4 oz (116.234 kg)  SpO2 93% Physical Exam  Constitutional: He is oriented to person, place, and time. He appears well-developed and well-nourished. No distress.  HENT:  Head: Normocephalic and atraumatic.  Right Ear: External ear normal.  Left Ear: External ear normal.  Nose: Nose normal.  Mouth/Throat: Oropharynx is clear and moist. No oropharyngeal exudate.  Eyes: Conjunctivae and EOM are normal. Pupils are equal, round, and reactive to light. Right eye exhibits no discharge. Left eye exhibits no discharge. No scleral icterus.  Neck: Normal range of motion. Neck supple. No tracheal deviation present. No thyromegaly present.  Cardiovascular: Normal rate, regular rhythm and normal heart sounds.  Exam reveals no gallop and no friction rub.   No murmur heard. Pulmonary/Chest: Effort normal  and breath sounds normal. No accessory muscle usage. Not tachypneic. No respiratory distress. He has no decreased breath sounds. He has no wheezes. He has no rhonchi. He has no rales. He exhibits no tenderness.  Musculoskeletal: Normal range of motion. He exhibits no edema.  Lymphadenopathy:    He has no cervical adenopathy.  Neurological: He is alert and oriented to person, place, and time. No cranial nerve deficit. Coordination normal.  Skin: Skin is warm and dry. No rash noted. He is not diaphoretic. No erythema. No pallor.  Psychiatric: He has a normal mood and affect. His behavior is normal. Judgment and thought content normal.          Assessment & Plan:   Problem List Items Addressed This Visit   Diabetes mellitus type 2 in obese - Primary      Lab Results  Component Value Date   HGBA1C 7.2* 04/16/2014   BG relatively well controlled. Will continue metformin. Encouraged health diet and exercise with goal of weight loss.    Relevant Orders      Comprehensive metabolic panel      Hemoglobin A1c      Microalbumin / creatinine urine ratio      TSH   Hyperlipidemia     Lipids well controlled on Atorvastatin. Will continue.    Relevant Orders      Lipid panel   Hypertension      BP Readings from Last 3 Encounters:  04/17/14 126/68  01/08/14 124/70  10/01/13 120/68   BP well controlled with Lisinopril-HCTZ. Will continue.    Hypothyroidism     Symptomatically, doing well. Continue Levothyroxine.    Obesity (BMI  30-39.9)      Wt Readings from Last 3 Encounters:  04/17/14 256 lb 4 oz (116.234 kg)  01/08/14 256 lb (116.121 kg)  10/01/13 251 lb (113.853 kg)   Encouraged healthy diet and exercise with goal of weight loss.        Return in about 3 months (around 07/18/2014) for Recheck of Diabetes.

## 2014-04-17 NOTE — Assessment & Plan Note (Signed)
Lab Results  Component Value Date   HGBA1C 7.2* 04/16/2014   BG relatively well controlled. Will continue metformin. Encouraged health diet and exercise with goal of weight loss.

## 2014-04-17 NOTE — Assessment & Plan Note (Signed)
Symptomatically, doing well. Continue Levothyroxine.

## 2014-04-17 NOTE — Assessment & Plan Note (Signed)
Lipids well controlled on Atorvastatin. Will continue. 

## 2014-06-08 ENCOUNTER — Other Ambulatory Visit: Payer: Self-pay | Admitting: Internal Medicine

## 2014-07-01 ENCOUNTER — Other Ambulatory Visit: Payer: Self-pay | Admitting: Internal Medicine

## 2014-07-20 ENCOUNTER — Other Ambulatory Visit: Payer: 59

## 2014-07-23 ENCOUNTER — Other Ambulatory Visit (INDEPENDENT_AMBULATORY_CARE_PROVIDER_SITE_OTHER): Payer: 59

## 2014-07-23 ENCOUNTER — Ambulatory Visit: Payer: 59 | Admitting: Internal Medicine

## 2014-07-23 DIAGNOSIS — E119 Type 2 diabetes mellitus without complications: Secondary | ICD-10-CM

## 2014-07-23 DIAGNOSIS — E1169 Type 2 diabetes mellitus with other specified complication: Secondary | ICD-10-CM

## 2014-07-23 DIAGNOSIS — E669 Obesity, unspecified: Secondary | ICD-10-CM

## 2014-07-23 DIAGNOSIS — E785 Hyperlipidemia, unspecified: Secondary | ICD-10-CM

## 2014-07-23 LAB — LIPID PANEL
CHOL/HDL RATIO: 3
Cholesterol: 147 mg/dL (ref 0–200)
HDL: 52.1 mg/dL (ref >39.00)
LDL CALC: 73 mg/dL (ref 0–99)
NONHDL: 94.9
TRIGLYCERIDES: 109 mg/dL (ref 0.0–149.0)
VLDL: 21.8 mg/dL (ref 0.0–40.0)

## 2014-07-23 LAB — COMPREHENSIVE METABOLIC PANEL
ALBUMIN: 3.9 g/dL (ref 3.5–5.2)
ALT: 76 U/L — AB (ref 0–53)
AST: 73 U/L — ABNORMAL HIGH (ref 0–37)
Alkaline Phosphatase: 60 U/L (ref 39–117)
BUN: 9 mg/dL (ref 6–23)
CALCIUM: 9.2 mg/dL (ref 8.4–10.5)
CHLORIDE: 102 meq/L (ref 96–112)
CO2: 28 meq/L (ref 19–32)
CREATININE: 0.8 mg/dL (ref 0.4–1.5)
GFR: 103.09 mL/min (ref >60.00)
Glucose, Bld: 97 mg/dL (ref 70–99)
POTASSIUM: 4.5 meq/L (ref 3.5–5.1)
Sodium: 137 mEq/L (ref 135–145)
TOTAL PROTEIN: 6.8 g/dL (ref 6.0–8.3)
Total Bilirubin: 0.7 mg/dL (ref 0.2–1.2)

## 2014-07-23 LAB — MICROALBUMIN / CREATININE URINE RATIO
CREATININE, U: 117.3 mg/dL
MICROALB UR: 0.4 mg/dL (ref 0.0–1.9)
Microalb Creat Ratio: 0.3 mg/g (ref 0.0–30.0)

## 2014-07-23 LAB — HEMOGLOBIN A1C: Hgb A1c MFr Bld: 7.2 % — ABNORMAL HIGH (ref 4.6–6.5)

## 2014-07-23 LAB — TSH: TSH: 0.65 u[IU]/mL (ref 0.35–4.50)

## 2014-07-27 ENCOUNTER — Encounter: Payer: Self-pay | Admitting: Internal Medicine

## 2014-07-27 ENCOUNTER — Ambulatory Visit (INDEPENDENT_AMBULATORY_CARE_PROVIDER_SITE_OTHER): Payer: 59 | Admitting: Internal Medicine

## 2014-07-27 VITALS — BP 124/62 | HR 71 | Ht 71.0 in | Wt 253.0 lb

## 2014-07-27 DIAGNOSIS — E669 Obesity, unspecified: Secondary | ICD-10-CM

## 2014-07-27 DIAGNOSIS — R945 Abnormal results of liver function studies: Secondary | ICD-10-CM

## 2014-07-27 DIAGNOSIS — R7989 Other specified abnormal findings of blood chemistry: Secondary | ICD-10-CM | POA: Insufficient documentation

## 2014-07-27 DIAGNOSIS — E119 Type 2 diabetes mellitus without complications: Secondary | ICD-10-CM

## 2014-07-27 DIAGNOSIS — I10 Essential (primary) hypertension: Secondary | ICD-10-CM

## 2014-07-27 DIAGNOSIS — E785 Hyperlipidemia, unspecified: Secondary | ICD-10-CM

## 2014-07-27 DIAGNOSIS — E1169 Type 2 diabetes mellitus with other specified complication: Secondary | ICD-10-CM

## 2014-07-27 DIAGNOSIS — F411 Generalized anxiety disorder: Secondary | ICD-10-CM

## 2014-07-27 NOTE — Patient Instructions (Signed)
We will set up an ultrasound of your liver.  Follow up in 3 months.

## 2014-07-27 NOTE — Progress Notes (Signed)
Subjective:    Patient ID: Timothy Edwards, male    DOB: [birth year], 68 y.o.   MRN: [id number]  HPI 68YO male presents for follow up.  DM - Compliant with meds. Only rarely checks BG.   Working on [employer] and staying active. Has lost another 3lbs.  Concerned about anxiety in his son and questions whether this anxiety might be his "fault" given his own issues with anxiety. He feels that his own symptoms have been well controlled on medication.  Review of Systems  Constitutional: Negative for fever, chills, activity change, appetite change, fatigue and unexpected weight change.  Eyes: Negative for visual disturbance.  Respiratory: Negative for cough and shortness of breath.   Cardiovascular: Negative for chest pain, palpitations and leg swelling.  Gastrointestinal: Negative for nausea, vomiting, abdominal pain, diarrhea, constipation and abdominal distention.  Genitourinary: Negative for dysuria, urgency and difficulty urinating.  Musculoskeletal: Negative for arthralgias and gait problem.  Skin: Negative for color change and rash.  Hematological: Negative for adenopathy.  Psychiatric/Behavioral: Negative for suicidal ideas, sleep disturbance and dysphoric mood. The patient is not nervous/anxious.        Objective:    BP 124/62  Pulse 71  Ht  (1.803 m)  Wt 253 lb (114.76 kg)  BMI 35.30 kg/m2  SpO2 95% Physical Exam  Constitutional: He is oriented to person, place, and time. He appears well-developed and well-nourished. No distress.  HENT:  Head: Normocephalic and atraumatic.  Right Ear: External ear normal.  Left Ear: External ear normal.  Nose: Nose normal.  Mouth/Throat: Oropharynx is clear and moist. No oropharyngeal exudate.  Eyes: Conjunctivae and EOM are normal. Pupils are equal, round, and reactive to light. Right eye exhibits no discharge. Left eye exhibits no discharge. No scleral icterus.  Neck: Normal range of motion. Neck supple. No tracheal  deviation present. No thyromegaly present.  Cardiovascular: Normal rate, regular rhythm and normal heart sounds.  Exam reveals no gallop and no friction rub.   No murmur heard. Pulmonary/Chest: Effort normal and breath sounds normal. No accessory muscle usage. Not tachypneic. No respiratory distress. He has no decreased breath sounds. He has no wheezes. He has no rhonchi. He has no rales. He exhibits no tenderness.  Musculoskeletal: Normal range of motion. He exhibits no edema.  Lymphadenopathy:    He has no cervical adenopathy.  Neurological: He is alert and oriented to person, place, and time. No cranial nerve deficit. Coordination normal.  Skin: Skin is warm and dry. No rash noted. He is not diaphoretic. No erythema. No pallor.  Psychiatric: His speech is normal and behavior is normal. Judgment and thought content normal. His mood appears anxious. Cognition and memory are normal. He expresses no homicidal and no suicidal ideation.          Assessment & Plan:   Problem List Items Addressed This Visit     High   Diabetes mellitus type 2 in obese - Primary      Lab Results  Component Value Date   HGBA1C 7.2* 07/23/2014   BG stable on Metformin. Encouraged healthy diet and exercise. Foot exam normal today.    Generalized anxiety disorder     Symptoms well controlled on current medication. Will continue.    Hyperlipidemia      Lab Results  Component Value Date   LDLCALC 73 07/23/2014   Lipids well controlled on Atorvastatin. Will continue.    Hypertension      BP Readings from Last 3 Encounters:  07/27/14 124/62  04/17/14 126/68  01/08/14 124/70   BP well controlled on current medications. Will continue.    Obesity (BMI 30-39.9)      Wt Readings from Last 3 Encounters:  07/27/14 253 lb (114.76 kg)  04/17/14 256 lb 4 oz (116.234 kg)  01/08/14 256 lb (116.121 kg)   Body mass index is 35.3 kg/(m^2). Encouraged healthy diet and exercise. Congratulated pt on weight  loss.      Unprioritized   Elevated LFTs     LFTs elevated on recent check. Suspect fatty liver. Will get US liver.    Relevant Orders      US Abdomen Limited RUQ       Return in about 3 months (around 10/27/2014) for Recheck of Diabetes.

## 2014-07-27 NOTE — Assessment & Plan Note (Signed)
LFTs elevated on recent check. Suspect fatty liver. Will get US liver.

## 2014-07-27 NOTE — Assessment & Plan Note (Signed)
BP Readings from Last 3 Encounters:  07/27/14 124/62  04/17/14 126/68  01/08/14 124/70   BP well controlled on current medications. Will continue.

## 2014-07-27 NOTE — Assessment & Plan Note (Signed)
Lab Results  Component Value Date   HGBA1C 7.2* 07/23/2014   BG stable on Metformin. Encouraged healthy diet and exercise. Foot exam normal today.

## 2014-07-27 NOTE — Assessment & Plan Note (Signed)
Lab Results  Component Value Date   LDLCALC 73 07/23/2014   Lipids well controlled on Atorvastatin. Will continue.

## 2014-07-27 NOTE — Assessment & Plan Note (Signed)
Symptoms well controlled on current medication. Will continue. 

## 2014-07-27 NOTE — Progress Notes (Signed)
Pre visit review using our clinic review tool, if applicable. No additional management support is needed unless otherwise documented below in the visit note. 

## 2014-07-27 NOTE — Assessment & Plan Note (Signed)
Wt Readings from Last 3 Encounters:  07/27/14 253 lb (114.76 kg)  04/17/14 256 lb 4 oz (116.234 kg)  01/08/14 256 lb (116.121 kg)   Body mass index is 35.3 kg/(m^2). Encouraged healthy diet and exercise. Congratulated pt on weight loss.

## 2014-08-03 ENCOUNTER — Ambulatory Visit: Payer: Self-pay | Admitting: Internal Medicine

## 2014-08-04 ENCOUNTER — Telehealth: Payer: Self-pay | Admitting: Internal Medicine

## 2014-08-04 NOTE — Telephone Encounter (Signed)
US liver showed fatty infiltration.

## 2014-08-24 ENCOUNTER — Encounter: Payer: Self-pay | Admitting: Internal Medicine

## 2014-08-31 ENCOUNTER — Other Ambulatory Visit: Payer: Self-pay | Admitting: Internal Medicine

## 2014-10-26 ENCOUNTER — Encounter: Payer: Self-pay | Admitting: Internal Medicine

## 2014-10-26 ENCOUNTER — Ambulatory Visit (INDEPENDENT_AMBULATORY_CARE_PROVIDER_SITE_OTHER): Payer: 59 | Admitting: Internal Medicine

## 2014-10-26 VITALS — BP 124/66 | HR 66 | Temp 98.1°F | Ht 71.0 in | Wt 250.5 lb

## 2014-10-26 DIAGNOSIS — E785 Hyperlipidemia, unspecified: Secondary | ICD-10-CM

## 2014-10-26 DIAGNOSIS — E119 Type 2 diabetes mellitus without complications: Secondary | ICD-10-CM

## 2014-10-26 DIAGNOSIS — E039 Hypothyroidism, unspecified: Secondary | ICD-10-CM

## 2014-10-26 DIAGNOSIS — E1169 Type 2 diabetes mellitus with other specified complication: Secondary | ICD-10-CM

## 2014-10-26 DIAGNOSIS — I10 Essential (primary) hypertension: Secondary | ICD-10-CM

## 2014-10-26 DIAGNOSIS — E669 Obesity, unspecified: Secondary | ICD-10-CM

## 2014-10-26 LAB — COMPREHENSIVE METABOLIC PANEL
ALT: 59 U/L — ABNORMAL HIGH (ref 0–53)
AST: 78 U/L — AB (ref 0–37)
Albumin: 4.2 g/dL (ref 3.5–5.2)
Alkaline Phosphatase: 71 U/L (ref 39–117)
BUN: 13 mg/dL (ref 6–23)
CALCIUM: 9.8 mg/dL (ref 8.4–10.5)
CO2: 26 mEq/L (ref 19–32)
CREATININE: 0.9 mg/dL (ref 0.4–1.5)
Chloride: 99 mEq/L (ref 96–112)
GFR: 88.62 mL/min (ref >60.00)
Glucose, Bld: 106 mg/dL — ABNORMAL HIGH (ref 70–99)
Potassium: 4.2 mEq/L (ref 3.5–5.1)
Sodium: 136 mEq/L (ref 135–145)
Total Bilirubin: 0.8 mg/dL (ref 0.2–1.2)
Total Protein: 7.2 g/dL (ref 6.0–8.3)

## 2014-10-26 LAB — LIPID PANEL
CHOLESTEROL: 165 mg/dL (ref 0–200)
HDL: 49.9 mg/dL (ref >39.00)
LDL Cholesterol: 93 mg/dL (ref 0–99)
NonHDL: 115.1
TRIGLYCERIDES: 109 mg/dL (ref 0.0–149.0)
Total CHOL/HDL Ratio: 3
VLDL: 21.8 mg/dL (ref 0.0–40.0)

## 2014-10-26 LAB — HEMOGLOBIN A1C: Hgb A1c MFr Bld: 7.4 % — ABNORMAL HIGH (ref 4.6–6.5)

## 2014-10-26 LAB — TSH: TSH: 1.14 u[IU]/mL (ref 0.35–4.50)

## 2014-10-26 LAB — MICROALBUMIN / CREATININE URINE RATIO
CREATININE, U: 143.7 mg/dL
Microalb Creat Ratio: 0.6 mg/g (ref 0.0–30.0)
Microalb, Ur: 0.8 mg/dL (ref 0.0–1.9)

## 2014-10-26 NOTE — Assessment & Plan Note (Signed)
Will check TSH with labs today. Continue Levothyroxine. 

## 2014-10-26 NOTE — Patient Instructions (Signed)
Labs today.  Follow up in 3 months or sooner as needed. 

## 2014-10-26 NOTE — Progress Notes (Signed)
Pre visit review using our clinic review tool, if applicable. No additional management support is needed unless otherwise documented below in the visit note. 

## 2014-10-26 NOTE — Assessment & Plan Note (Signed)
Will check A1c with labs today. Continue Metformin. 

## 2014-10-26 NOTE — Assessment & Plan Note (Signed)
Will check lipids and lfts with labs today. Continue Atorvastatin.

## 2014-10-26 NOTE — Assessment & Plan Note (Signed)
BP Readings from Last 3 Encounters:  10/26/14 124/66  07/27/14 124/62  04/17/14 126/68   BP well controlled. Continue Lisinopril-HCTZ. Renal function with labs today.

## 2014-10-26 NOTE — Progress Notes (Signed)
Subjective:    Patient ID: Timothy Edwards, male    DOB: [birth year], 68 y.o.   MRN: [id number]  HPI 68YO male presents for follow up.  DM - BG well controlled. Did not bring record today.  Compliant with medications.  Feeling well. No concerns. Enjoying recent time spent with new Granddaughter.   Review of Systems  Constitutional: Negative for fever, chills, activity change, appetite change, fatigue and unexpected weight change.  Eyes: Negative for visual disturbance.  Respiratory: Negative for cough and shortness of breath.   Cardiovascular: Negative for chest pain, palpitations and leg swelling.  Gastrointestinal: Negative for nausea, vomiting, abdominal pain, diarrhea, constipation and abdominal distention.  Genitourinary: Negative for dysuria, urgency and difficulty urinating.  Musculoskeletal: Negative for myalgias, arthralgias and gait problem.  Skin: Negative for color change and rash.  Hematological: Negative for adenopathy.  Psychiatric/Behavioral: Negative for sleep disturbance and dysphoric mood. The patient is not nervous/anxious.        Objective:    BP 124/66 mmHg  Pulse 66  Temp(Src) 98.1 F (36.7 C) (Oral)  Ht 5\' 11"  (1.803 m)  Wt 250 lb 8 oz (113.626 kg)  BMI 34.95 kg/m2  SpO2 95% Physical Exam  Constitutional: He is oriented to person, place, and time. He appears well-developed and well-nourished. No distress.  HENT:  Head: Normocephalic and atraumatic.  Right Ear: External ear normal.  Left Ear: External ear normal.  Nose: Nose normal.  Mouth/Throat: Oropharynx is clear and moist. No oropharyngeal exudate.  Eyes: Conjunctivae and EOM are normal. Pupils are equal, round, and reactive to light. Right eye exhibits no discharge. Left eye exhibits no discharge. No scleral icterus.  Neck: Normal range of motion. Neck supple. No tracheal deviation present. No thyromegaly present.  Cardiovascular: Normal rate, regular rhythm and normal heart sounds.   Exam reveals no gallop and no friction rub.   No murmur heard. Pulmonary/Chest: Effort normal and breath sounds normal. No accessory muscle usage. No tachypnea. No respiratory distress. He has no decreased breath sounds. He has no wheezes. He has no rhonchi. He has no rales. He exhibits no tenderness.  Musculoskeletal: Normal range of motion. He exhibits no edema.  Lymphadenopathy:    He has no cervical adenopathy.  Neurological: He is alert and oriented to person, place, and time. No cranial nerve deficit. Coordination normal.  Skin: Skin is warm and dry. No rash noted. He is not diaphoretic. No erythema. No pallor.  Psychiatric: He has a normal mood and affect. His behavior is normal. Judgment and thought content normal.          Assessment & Plan:   Problem List Items Addressed This Visit      High   Diabetes mellitus type 2 in obese - Primary    Will check A1c with labs today. Continue Metformin.    Relevant Orders      Comprehensive metabolic panel      Hemoglobin A1c      Microalbumin / creatinine urine ratio   Hyperlipidemia    Will check lipids and lfts with labs today. Continue Atorvastatin.    Relevant Orders      Lipid panel   Hypertension    BP Readings from Last 3 Encounters:  10/26/14 124/66  07/27/14 124/62  04/17/14 126/68   BP well controlled. Continue Lisinopril-HCTZ. Renal function with labs today.    Hypothyroidism    Will check TSH with labs today. Continue Levothyroxine.    Relevant Orders  TSH       Return in about 3 months (around 01/26/2015) for Recheck of Diabetes.

## 2014-12-03 ENCOUNTER — Encounter: Payer: Self-pay | Admitting: *Deleted

## 2014-12-29 ENCOUNTER — Other Ambulatory Visit: Payer: Self-pay | Admitting: Internal Medicine

## 2015-01-26 ENCOUNTER — Encounter: Payer: Self-pay | Admitting: Internal Medicine

## 2015-01-26 ENCOUNTER — Ambulatory Visit (INDEPENDENT_AMBULATORY_CARE_PROVIDER_SITE_OTHER): Payer: 59 | Admitting: Internal Medicine

## 2015-01-26 VITALS — BP 131/71 | HR 70 | Temp 97.9°F | Ht 71.0 in | Wt 247.5 lb

## 2015-01-26 DIAGNOSIS — E669 Obesity, unspecified: Secondary | ICD-10-CM

## 2015-01-26 DIAGNOSIS — E039 Hypothyroidism, unspecified: Secondary | ICD-10-CM

## 2015-01-26 DIAGNOSIS — M1990 Unspecified osteoarthritis, unspecified site: Secondary | ICD-10-CM

## 2015-01-26 DIAGNOSIS — E785 Hyperlipidemia, unspecified: Secondary | ICD-10-CM

## 2015-01-26 DIAGNOSIS — I10 Essential (primary) hypertension: Secondary | ICD-10-CM

## 2015-01-26 DIAGNOSIS — E1169 Type 2 diabetes mellitus with other specified complication: Secondary | ICD-10-CM

## 2015-01-26 DIAGNOSIS — E119 Type 2 diabetes mellitus without complications: Secondary | ICD-10-CM

## 2015-01-26 LAB — COMPREHENSIVE METABOLIC PANEL
ALT: 58 U/L — AB (ref 0–53)
AST: 55 U/L — AB (ref 0–37)
Albumin: 4.3 g/dL (ref 3.5–5.2)
Alkaline Phosphatase: 78 U/L (ref 39–117)
BILIRUBIN TOTAL: 0.9 mg/dL (ref 0.2–1.2)
BUN: 13 mg/dL (ref 6–23)
CO2: 32 mEq/L (ref 19–32)
Calcium: 9.7 mg/dL (ref 8.4–10.5)
Chloride: 95 mEq/L — ABNORMAL LOW (ref 96–112)
Creatinine, Ser: 0.77 mg/dL (ref 0.40–1.50)
GFR: 106.03 mL/min (ref >60.00)
Glucose, Bld: 218 mg/dL — ABNORMAL HIGH (ref 70–99)
Potassium: 4.8 mEq/L (ref 3.5–5.1)
Sodium: 134 mEq/L — ABNORMAL LOW (ref 135–145)
Total Protein: 6.8 g/dL (ref 6.0–8.3)

## 2015-01-26 LAB — LIPID PANEL
CHOL/HDL RATIO: 3
Cholesterol: 156 mg/dL (ref 0–200)
HDL: 47.3 mg/dL (ref >39.00)
LDL Cholesterol: 87 mg/dL (ref 0–99)
NonHDL: 108.7
Triglycerides: 107 mg/dL (ref 0.0–149.0)
VLDL: 21.4 mg/dL (ref 0.0–40.0)

## 2015-01-26 LAB — TSH: TSH: 1.27 u[IU]/mL (ref 0.35–4.50)

## 2015-01-26 LAB — HEMOGLOBIN A1C: Hgb A1c MFr Bld: 9.6 % — ABNORMAL HIGH (ref 4.6–6.5)

## 2015-01-26 MED ORDER — MELOXICAM 15 MG PO TABS: 15.0000 mg | ORAL_TABLET | Freq: Every day | ORAL | Status: DC

## 2015-01-26 NOTE — Assessment & Plan Note (Signed)
Symptoms and exam most consistent with mild OA. Will start prn Meloxicam. Discussed potential risks and benefits of this medication. Follow up prn if symptoms are not improving.

## 2015-01-26 NOTE — Assessment & Plan Note (Signed)
Will check TSH with labs. Continue Levothyroxine. 

## 2015-01-26 NOTE — Assessment & Plan Note (Signed)
Will check lipids and LFTs with labs today. Continue Atorvastatin. 

## 2015-01-26 NOTE — Progress Notes (Signed)
Pre visit review using our clinic review tool, if applicable. No additional management support is needed unless otherwise documented below in the visit note. 

## 2015-01-26 NOTE — Progress Notes (Signed)
Subjective:    Patient ID: Oren BracketCharles Roger Rhew, male    DOB: Nov 11, 1946, 69 y.o.   MRN: [id number]  HPI  69YO male presents for follow up.  Feeling well.   Having pain in right knee and right wrist. Has history of multiple injuries to hands. 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 Not taking anything for pain.  Aside from this, no concerns. Compliant with meds. Does not check BG. Trying to limit calories in effort to lose weight.   Wt Readings from Last 3 Encounters:  01/26/15 247 lb 8 oz (112.265 kg)  10/26/14 250 lb 8 oz (113.626 kg)  07/27/14 253 lb (114.76 kg)     Past medical, surgical, family and social history per today's encounter.  Review of Systems  Constitutional: Negative for fever, chills, activity change, appetite change, fatigue and unexpected weight change.  Eyes: Negative for visual disturbance.  Respiratory: Negative for cough and shortness of breath.   Cardiovascular: Negative for chest pain, palpitations and leg swelling.  Gastrointestinal: Negative for nausea, vomiting, abdominal pain, diarrhea, constipation and abdominal distention.  Genitourinary: Negative for dysuria, urgency and difficulty urinating.  Musculoskeletal: Positive for arthralgias. Negative for gait problem.  Skin: Negative for color change and rash.  Hematological: Negative for adenopathy.  Psychiatric/Behavioral: Negative for sleep disturbance and dysphoric mood. The patient is not nervous/anxious.        Objective:    BP 131/71 mmHg  Pulse 70  Temp(Src) 97.9 F (36.6 C) (Oral)  Ht 5\' 11"  (1.803 m)  Wt 247 lb 8 oz (112.265 kg)  BMI 34.53 kg/m2  SpO2 95% Physical Exam  Constitutional: He is oriented to person, place, and time. He appears well-developed and well-nourished. No distress.  HENT:  Head: Normocephalic and atraumatic.  Right Ear: External ear normal.  Left Ear: External ear normal.  Nose: Nose normal.  Mouth/Throat: Oropharynx is clear and moist.  No oropharyngeal exudate.  Eyes: Conjunctivae and EOM are normal. Pupils are equal, round, and reactive to light. Right eye exhibits no discharge. Left eye exhibits no discharge. No scleral icterus.  Neck: Normal range of motion. Neck supple. No tracheal deviation present. No thyromegaly present.  Cardiovascular: Normal rate, regular rhythm and normal heart sounds.  Exam reveals no gallop and no friction rub.   No murmur heard. Pulmonary/Chest: Effort normal and breath sounds normal. No accessory muscle usage. No tachypnea. No respiratory distress. He has no decreased breath sounds. He has no wheezes. He has no rhonchi. He has no rales. He exhibits no tenderness.  Musculoskeletal: Normal range of motion. He exhibits no edema.       Right wrist: He exhibits normal range of motion, no tenderness, no swelling and no deformity.       Right knee: He exhibits normal range of motion and no swelling. No tenderness found.  Lymphadenopathy:    He has no cervical adenopathy.  Neurological: He is alert and oriented to person, place, and time. No cranial nerve deficit. Coordination normal.  Skin: Skin is warm and dry. No rash noted. He is not diaphoretic. No erythema. No pallor.  Psychiatric: He has a normal mood and affect. His behavior is normal. Judgment and thought content normal.          Assessment & Plan:   Problem List Items Addressed This Visit      High   Diabetes mellitus type 2 in obese - Primary    Will check A1c with labs. Continue Metformin. Encouraged healthy  diet and exercise.      Relevant Orders   Comprehensive metabolic panel   Hemoglobin A1c   Hyperlipidemia    Will check lipids and LFTs with labs today. Continue Atorvastatin.      Relevant Orders   Lipid panel   Hypertension    BP Readings from Last 3 Encounters:  01/26/15 131/71  10/26/14 124/66  07/27/14 124/62   BP well controlled. Renal function with labs. Continue Lisinopril-HCTZ.      Hypothyroidism     Will check TSH with labs. Continue Levothyroxine.      Relevant Orders   TSH     Unprioritized   Arthritis    Symptoms and exam most consistent with mild OA. Will start prn Meloxicam. Discussed potential risks and benefits of this medication. Follow up prn if symptoms are not improving.      Relevant Medications   meloxicam (MOBIC) tablet       Return in about 3 months (around 04/26/2015) for Recheck of Diabetes.

## 2015-01-26 NOTE — Assessment & Plan Note (Signed)
BP Readings from Last 3 Encounters:  01/26/15 131/71  10/26/14 124/66  07/27/14 124/62   BP well controlled. Renal function with labs. Continue Lisinopril-HCTZ.

## 2015-01-26 NOTE — Patient Instructions (Signed)
Start Meloxicam 15mg  daily to help with joint pain. Do not take Ibuprofen or Aleve with this medication.  Consider taking over the counter Glucosamine and/or Tumeric.  Follow up if symptoms are not improving.

## 2015-01-26 NOTE — Assessment & Plan Note (Signed)
Will check A1c with labs. Continue Metformin. Encouraged healthy diet and exercise. 

## 2015-01-28 ENCOUNTER — Other Ambulatory Visit: Payer: Self-pay | Admitting: *Deleted

## 2015-01-28 MED ORDER — GLIPIZIDE ER 2.5 MG PO TB24: 2.5000 mg | ORAL_TABLET | Freq: Every day | ORAL | Status: DC

## 2015-02-01 ENCOUNTER — Other Ambulatory Visit: Payer: Self-pay | Admitting: Internal Medicine

## 2015-02-01 MED ORDER — GLUCOSE BLOOD VI STRP: ORAL_STRIP | Status: AC

## 2015-02-01 MED ORDER — ONETOUCH BASIC SYSTEM W/DEVICE KIT: PACK | Status: AC

## 2015-02-01 MED ORDER — ONETOUCH LANCETS MISC: Status: AC

## 2015-02-02 ENCOUNTER — Telehealth: Payer: Self-pay | Admitting: *Deleted

## 2015-02-02 DIAGNOSIS — E669 Obesity, unspecified: Principal | ICD-10-CM

## 2015-02-02 DIAGNOSIS — E1169 Type 2 diabetes mellitus with other specified complication: Secondary | ICD-10-CM

## 2015-02-02 NOTE — Telephone Encounter (Signed)
Pt's spouse had sent mychart through her account. Here is message, regarding patient:  Dr. Dan HumphreysWalker,   Could you please request a referal for the [facility] diabetes education for my husband, Timothy Edwards. He has finally agreed to go and I think it may help him to better understand Type II diabetes.   Thanks,  Dianna   Pt was notified to not use her mychart account to discuss his health information. She called to give DOB. Advised I would send message on to Dr. Dan HumphreysWalker regarding referral,  verbalized understanding

## 2015-02-22 ENCOUNTER — Ambulatory Visit
Admit: 2015-02-22 | Disposition: A | Discharge status: Home / Self Care | Payer: Self-pay | Attending: Internal Medicine | Admitting: Internal Medicine

## 2015-02-25 ENCOUNTER — Other Ambulatory Visit: Payer: Self-pay

## 2015-03-05 ENCOUNTER — Ambulatory Visit
Admit: 2015-03-05 | Disposition: A | Discharge status: Home / Self Care | Payer: Self-pay | Attending: Internal Medicine | Admitting: Internal Medicine

## 2015-04-12 ENCOUNTER — Ambulatory Visit: Payer: 59

## 2015-04-19 ENCOUNTER — Ambulatory Visit: Payer: 59

## 2015-04-21 ENCOUNTER — Encounter: Payer: Self-pay | Admitting: *Deleted

## 2015-04-22 ENCOUNTER — Other Ambulatory Visit: Payer: Self-pay | Admitting: Internal Medicine

## 2015-04-26 ENCOUNTER — Ambulatory Visit: Payer: 59

## 2015-04-27 ENCOUNTER — Encounter: Payer: Self-pay | Admitting: Internal Medicine

## 2015-04-27 ENCOUNTER — Ambulatory Visit (INDEPENDENT_AMBULATORY_CARE_PROVIDER_SITE_OTHER): Payer: 59 | Admitting: Internal Medicine

## 2015-04-27 VITALS — BP 129/72 | HR 62 | Temp 98.2°F | Ht 71.0 in | Wt 253.4 lb

## 2015-04-27 DIAGNOSIS — E119 Type 2 diabetes mellitus without complications: Secondary | ICD-10-CM

## 2015-04-27 DIAGNOSIS — E039 Hypothyroidism, unspecified: Secondary | ICD-10-CM | POA: Diagnosis not present

## 2015-04-27 DIAGNOSIS — F32A Depression, unspecified: Secondary | ICD-10-CM | POA: Insufficient documentation

## 2015-04-27 DIAGNOSIS — I10 Essential (primary) hypertension: Secondary | ICD-10-CM

## 2015-04-27 DIAGNOSIS — F329 Major depressive disorder, single episode, unspecified: Secondary | ICD-10-CM | POA: Insufficient documentation

## 2015-04-27 DIAGNOSIS — E785 Hyperlipidemia, unspecified: Secondary | ICD-10-CM | POA: Diagnosis not present

## 2015-04-27 DIAGNOSIS — E669 Obesity, unspecified: Secondary | ICD-10-CM

## 2015-04-27 DIAGNOSIS — E1169 Type 2 diabetes mellitus with other specified complication: Secondary | ICD-10-CM

## 2015-04-27 DIAGNOSIS — F4323 Adjustment disorder with mixed anxiety and depressed mood: Secondary | ICD-10-CM

## 2015-04-27 LAB — COMPREHENSIVE METABOLIC PANEL
ALK PHOS: 61 U/L (ref 39–117)
ALT: 43 U/L (ref 0–53)
AST: 43 U/L — ABNORMAL HIGH (ref 0–37)
Albumin: 4.1 g/dL (ref 3.5–5.2)
BUN: 16 mg/dL (ref 6–23)
CO2: 31 mEq/L (ref 19–32)
CREATININE: 0.77 mg/dL (ref 0.40–1.50)
Calcium: 9.4 mg/dL (ref 8.4–10.5)
Chloride: 101 mEq/L (ref 96–112)
GFR: 105.95 mL/min (ref >60.00)
Glucose, Bld: 87 mg/dL (ref 70–99)
Potassium: 4.5 mEq/L (ref 3.5–5.1)
Sodium: 139 mEq/L (ref 135–145)
Total Bilirubin: 0.9 mg/dL (ref 0.2–1.2)
Total Protein: 6.5 g/dL (ref 6.0–8.3)

## 2015-04-27 LAB — LIPID PANEL
Cholesterol: 142 mg/dL (ref 0–200)
HDL: 51.7 mg/dL (ref >39.00)
LDL Cholesterol: 72 mg/dL (ref 0–99)
NONHDL: 90.3
Total CHOL/HDL Ratio: 3
Triglycerides: 90 mg/dL (ref 0.0–149.0)
VLDL: 18 mg/dL (ref 0.0–40.0)

## 2015-04-27 LAB — MICROALBUMIN / CREATININE URINE RATIO
Creatinine,U: 127 mg/dL
Microalb Creat Ratio: 0.7 mg/g (ref 0.0–30.0)
Microalb, Ur: 0.9 mg/dL (ref 0.0–1.9)

## 2015-04-27 LAB — TSH: TSH: 1.36 u[IU]/mL (ref 0.35–4.50)

## 2015-04-27 LAB — HEMOGLOBIN A1C: Hgb A1c MFr Bld: 6.4 % (ref 4.6–6.5)

## 2015-04-27 NOTE — Assessment & Plan Note (Signed)
Will check lipids with labs. Continue Atorvastatin. 

## 2015-04-27 NOTE — Assessment & Plan Note (Signed)
Wt Readings from Last 3 Encounters:  04/27/15 253 lb 6 oz (114.93 kg)  01/26/15 247 lb 8 oz (112.265 kg)  10/26/14 250 lb 8 oz (113.626 kg)   Body mass index is 35.35 kg/(m^2).  The patient is asked to make an attempt to improve diet and exercise patterns to aid in medical management of this problem.

## 2015-04-27 NOTE — Assessment & Plan Note (Signed)
BP Readings from Last 3 Encounters:  04/27/15 129/72  01/26/15 131/71  10/26/14 124/66   BP well controlled. Continue current medications. Renal function with labs.

## 2015-04-27 NOTE — Progress Notes (Signed)
Pre visit review using our clinic review tool, if applicable. No additional management support is needed unless otherwise documented below in the visit note. 

## 2015-04-27 NOTE — Patient Instructions (Signed)

## 2015-04-27 NOTE — Progress Notes (Signed)
Subjective:    Patient ID: Timothy Edwards, male    DOB: 23-Oct-1946, 69 y.o.   MRN: [id number]  HPI  69YO male presents for follow up.  DM - Most BG near 80-110. Compliant with medication.  Notes some apathy about diet and exercise. Would like to lose weight however. Feels that he will likely die within next few years as his father died at [id number]. Feels that effort at healthy not worth it.   Wt Readings from Last 3 Encounters:  04/27/15 253 lb 6 oz (114.93 kg)  01/26/15 247 lb 8 oz (112.265 kg)  10/26/14 250 lb 8 oz (113.626 kg)     Past medical, surgical, family and social history per today's encounter.  Review of Systems  Constitutional: Negative for fever, chills, activity change, appetite change, fatigue and unexpected weight change.  Eyes: Negative for visual disturbance.  Respiratory: Negative for cough and shortness of breath.   Cardiovascular: Negative for chest pain, palpitations and leg swelling.  Gastrointestinal: Negative for abdominal pain and abdominal distention.  Genitourinary: Negative for dysuria, urgency and difficulty urinating.  Musculoskeletal: Negative for arthralgias and gait problem.  Skin: Negative for color change and rash.  Hematological: Negative for adenopathy.  Psychiatric/Behavioral: Positive for dysphoric mood. Negative for suicidal ideas and sleep disturbance. The patient is not nervous/anxious.        Objective:    BP 129/72 mmHg  Pulse 62  Temp(Src) 98.2 F (36.8 C) (Oral)  Ht 5\' 11"  (1.803 m)  Wt 253 lb 6 oz (114.93 kg)  BMI 35.35 kg/m2  SpO2 96% Physical Exam  Constitutional: He is oriented to person, place, and time. He appears well-developed and well-nourished. No distress.  HENT:  Head: Normocephalic and atraumatic.  Right Ear: External ear normal.  Left Ear: External ear normal.  Nose: Nose normal.  Mouth/Throat: Oropharynx is clear and moist. No oropharyngeal exudate.  Eyes: Conjunctivae and EOM are normal. Pupils  are equal, round, and reactive to light. Right eye exhibits no discharge. Left eye exhibits no discharge. No scleral icterus.  Neck: Normal range of motion. Neck supple. No tracheal deviation present. No thyromegaly present.  Cardiovascular: Normal rate, regular rhythm and normal heart sounds.  Exam reveals no gallop and no friction rub.   No murmur heard. Pulmonary/Chest: Effort normal and breath sounds normal. No accessory muscle usage. No tachypnea. No respiratory distress. He has no decreased breath sounds. He has no wheezes. He has no rhonchi. He has no rales. He exhibits no tenderness.  Musculoskeletal: Normal range of motion. He exhibits no edema.  Lymphadenopathy:    He has no cervical adenopathy.  Neurological: He is alert and oriented to person, place, and time. No cranial nerve deficit. Coordination normal.  Skin: Skin is warm and dry. No rash noted. He is not diaphoretic. No erythema. No pallor.  Psychiatric: His behavior is normal. Judgment and thought content normal. He exhibits a depressed mood. He expresses no suicidal ideation.          Assessment & Plan:   Problem List Items Addressed This Visit      High   Diabetes mellitus type 2 in obese - Primary    BG well controlled. A1c with labs. Continue current medications.      Relevant Orders   Comprehensive metabolic panel   Hemoglobin A1c   Lipid panel   Microalbumin / creatinine urine ratio   Hyperlipidemia    Will check lipids with labs. Continue Atorvastatin.  Hypertension    BP Readings from Last 3 Encounters:  04/27/15 129/72  01/26/15 131/71  10/26/14 124/66   BP well controlled. Continue current medications. Renal function with labs.      Hypothyroidism    Will check TSH with labs. Continue Levothyroxine.      Relevant Orders   TSH   Obesity (BMI 30-39.9)    Wt Readings from Last 3 Encounters:  04/27/15 253 lb 6 oz (114.93 kg)  01/26/15 247 lb 8 oz (112.265 kg)  10/26/14 250 lb 8 oz  (113.626 kg)   Body mass index is 35.35 kg/(m^2).  The patient is asked to make an attempt to improve diet and exercise patterns to aid in medical management of this problem.         Unprioritized   Adjustment disorder with mixed anxiety and depressed mood    Encouraged him to seek support from family and continue to participate in things he enjoys. Will hold Citalopram dose at  daily for now. Follow up in 3 months and prn.          Return in about 3 months (around 07/28/2015) for Recheck of Diabetes.

## 2015-04-27 NOTE — Assessment & Plan Note (Signed)
Encouraged him to seek support from family and continue to participate in things he enjoys. Will hold Citalopram dose at 20mg  daily for now. Follow up in 3 months and prn.

## 2015-04-27 NOTE — Assessment & Plan Note (Signed)
BG well controlled. A1c with labs. Continue current medications.

## 2015-04-27 NOTE — Assessment & Plan Note (Signed)
Will check TSH with labs. Continue Levothyroxine. 

## 2015-05-17 ENCOUNTER — Other Ambulatory Visit: Payer: Self-pay | Admitting: Internal Medicine

## 2015-05-24 ENCOUNTER — Other Ambulatory Visit: Payer: Self-pay | Admitting: Internal Medicine

## 2015-05-24 NOTE — Telephone Encounter (Signed)
Last visit 04/27/15, ok refill?

## 2015-06-14 ENCOUNTER — Other Ambulatory Visit: Payer: Self-pay | Admitting: Internal Medicine

## 2015-06-21 ENCOUNTER — Other Ambulatory Visit: Payer: Self-pay | Admitting: Internal Medicine

## 2015-06-28 ENCOUNTER — Other Ambulatory Visit: Payer: Self-pay | Admitting: Internal Medicine

## 2015-07-21 ENCOUNTER — Other Ambulatory Visit: Payer: Self-pay | Admitting: Internal Medicine

## 2015-07-28 ENCOUNTER — Ambulatory Visit (INDEPENDENT_AMBULATORY_CARE_PROVIDER_SITE_OTHER): Payer: 59 | Admitting: Internal Medicine

## 2015-07-28 ENCOUNTER — Encounter: Payer: Self-pay | Admitting: Internal Medicine

## 2015-07-28 VITALS — BP 123/65 | HR 66 | Temp 98.3°F | Ht 71.0 in | Wt 260.2 lb

## 2015-07-28 DIAGNOSIS — E669 Obesity, unspecified: Secondary | ICD-10-CM

## 2015-07-28 DIAGNOSIS — E119 Type 2 diabetes mellitus without complications: Secondary | ICD-10-CM | POA: Diagnosis not present

## 2015-07-28 DIAGNOSIS — I10 Essential (primary) hypertension: Secondary | ICD-10-CM

## 2015-07-28 DIAGNOSIS — E1169 Type 2 diabetes mellitus with other specified complication: Secondary | ICD-10-CM

## 2015-07-28 LAB — COMPREHENSIVE METABOLIC PANEL
ALK PHOS: 60 U/L (ref 39–117)
ALT: 38 U/L (ref 0–53)
AST: 38 U/L — ABNORMAL HIGH (ref 0–37)
Albumin: 4 g/dL (ref 3.5–5.2)
BILIRUBIN TOTAL: 0.5 mg/dL (ref 0.2–1.2)
BUN: 20 mg/dL (ref 6–23)
CO2: 30 mEq/L (ref 19–32)
Calcium: 9.9 mg/dL (ref 8.4–10.5)
Chloride: 100 mEq/L (ref 96–112)
Creatinine, Ser: 0.83 mg/dL (ref 0.40–1.50)
GFR: 97.09 mL/min (ref >60.00)
Glucose, Bld: 183 mg/dL — ABNORMAL HIGH (ref 70–99)
Potassium: 4.5 mEq/L (ref 3.5–5.1)
SODIUM: 137 meq/L (ref 135–145)
TOTAL PROTEIN: 6.5 g/dL (ref 6.0–8.3)

## 2015-07-28 LAB — HEMOGLOBIN A1C: Hgb A1c MFr Bld: 7.3 % — ABNORMAL HIGH (ref 4.6–6.5)

## 2015-07-28 NOTE — Assessment & Plan Note (Signed)
BP Readings from Last 3 Encounters:  07/28/15 123/65  04/27/15 129/72  01/26/15 131/71   BP well controlled. Renal funciton with labs. Continue Lisinopril-HCTZ

## 2015-07-28 NOTE — Assessment & Plan Note (Signed)
Wt Readings from Last 3 Encounters:  07/28/15 260 lb 4 oz (118.049 kg)  04/27/15 253 lb 6 oz (114.93 kg)  01/26/15 247 lb 8 oz (112.265 kg)   Body mass index is 36.31 kg/(m^2). Encouraged healthy diet and exercise.

## 2015-07-28 NOTE — Progress Notes (Signed)
Subjective:    Patient ID: Timothy Edwards, male    DOB: 09/24/1946, 69 y.o.   MRN: [id number]  HPI  68YO male presents for follow up.  DM - BG running mostly in 90s. Compliant with medications.  No concerns today. Feeling well. Active. Notes some dietary indiscretion. Stopping for donuts several times per day. Not recently exercising.    BP Readings from Last 3 Encounters:  07/28/15 123/65  04/27/15 129/72  01/26/15 131/71   Wt Readings from Last 3 Encounters:  07/28/15 260 lb 4 oz (118.049 kg)  04/27/15 253 lb 6 oz (114.93 kg)  01/26/15 247 lb 8 oz (112.265 kg)     Past Medical History  Diagnosis Date  . Hypertension   . Unspecified hypothyroidism   . Hyperlipidemia   . Depression   . Vitamin D deficiency    Family History  Problem Relation Age of Onset  . Obesity Father    No past surgical history on file. Social History   Social History  . Marital Status: Married    Spouse Name: N/A  . Number of Children: N/A  . Years of Education: N/A   Social History Main Topics  . Smoking status: Never Smoker   . Smokeless tobacco: None  . Alcohol Use: None  . Drug Use: None  . Sexual Activity: Not Asked   Other Topics Concern  . None   Social History Narrative    Review of Systems  Constitutional: Negative for fever, chills, activity change, appetite change, fatigue and unexpected weight change.  Eyes: Negative for visual disturbance.  Respiratory: Negative for cough and shortness of breath.   Cardiovascular: Negative for chest pain, palpitations and leg swelling.  Gastrointestinal: Negative for nausea, vomiting, abdominal pain, diarrhea, constipation and abdominal distention.  Genitourinary: Negative for dysuria, urgency and difficulty urinating.  Musculoskeletal: Negative for arthralgias and gait problem.  Skin: Negative for color change and rash.  Hematological: Negative for adenopathy.  Psychiatric/Behavioral: Negative for sleep disturbance  and dysphoric mood. The patient is not nervous/anxious.        Objective:    BP 123/65 mmHg  Pulse 66  Temp(Src) 98.3 F (36.8 C) (Oral)  Ht 5\' 11"  (1.803 m)  Wt 260 lb 4 oz (118.049 kg)  BMI 36.31 kg/m2  SpO2 96% Physical Exam  Constitutional: He is oriented to person, place, and time. He appears well-developed and well-nourished. No distress.  HENT:  Head: Normocephalic and atraumatic.  Right Ear: External ear normal.  Left Ear: External ear normal.  Nose: Nose normal.  Mouth/Throat: Oropharynx is clear and moist. No oropharyngeal exudate.  Eyes: Conjunctivae and EOM are normal. Pupils are equal, round, and reactive to light. Right eye exhibits no discharge. Left eye exhibits no discharge. No scleral icterus.  Neck: Normal range of motion. Neck supple. No tracheal deviation present. No thyromegaly present.  Cardiovascular: Normal rate, regular rhythm and normal heart sounds.  Exam reveals no gallop and no friction rub.   No murmur heard. Pulmonary/Chest: Effort normal and breath sounds normal. No accessory muscle usage. No tachypnea. No respiratory distress. He has no decreased breath sounds. He has no wheezes. He has no rhonchi. He has no rales. He exhibits no tenderness.  Musculoskeletal: Normal range of motion. He exhibits no edema.  Lymphadenopathy:    He has no cervical adenopathy.  Neurological: He is alert and oriented to person, place, and time. No cranial nerve deficit. Coordination normal.  Skin: Skin is warm and dry. No rash noted.  He is not diaphoretic. No erythema. No pallor.  Psychiatric: He has a normal mood and affect. His behavior is normal. Judgment and thought content normal.          Assessment & Plan:   Problem List Items Addressed This Visit      High   Diabetes mellitus type 2 in obese - Primary    BG well controlled by report. Continue Metformin and Glipizide. Repeat A1c with labs. Foot exam normal today. Encouraged him to get an eye exam.        Relevant Orders   Comprehensive metabolic panel   Hemoglobin A1c   Hypertension    BP Readings from Last 3 Encounters:  07/28/15 123/65  04/27/15 129/72  01/26/15 131/71   BP well controlled. Renal funciton with labs. Continue Lisinopril-HCTZ      Obesity (BMI 30-39.9)    Wt Readings from Last 3 Encounters:  07/28/15 260 lb 4 oz (118.049 kg)  04/27/15 253 lb 6 oz (114.93 kg)  01/26/15 247 lb 8 oz (112.265 kg)   Body mass index is 36.31 kg/(m^2). Encouraged healthy diet and exercise.          Return in about 3 months (around 10/28/2015) for Recheck of Diabetes.

## 2015-07-28 NOTE — Assessment & Plan Note (Signed)
BG well controlled by report. Continue Metformin and Glipizide. Repeat A1c with labs. Foot exam normal today. Encouraged him to get an eye exam.

## 2015-07-28 NOTE — Patient Instructions (Signed)
Labs today.  Continue to try to limit portion sizes and increase physical activity.

## 2015-07-28 NOTE — Progress Notes (Signed)
Pre visit review using our clinic review tool, if applicable. No additional management support is needed unless otherwise documented below in the visit note. 

## 2015-09-20 ENCOUNTER — Other Ambulatory Visit: Payer: Self-pay | Admitting: Internal Medicine

## 2015-10-18 ENCOUNTER — Other Ambulatory Visit: Payer: Self-pay | Admitting: Internal Medicine

## 2015-11-01 ENCOUNTER — Ambulatory Visit: Payer: 59 | Admitting: Internal Medicine

## 2015-11-02 ENCOUNTER — Encounter: Payer: Self-pay | Admitting: Internal Medicine

## 2015-11-02 ENCOUNTER — Ambulatory Visit (INDEPENDENT_AMBULATORY_CARE_PROVIDER_SITE_OTHER): Payer: 59 | Admitting: Internal Medicine

## 2015-11-02 VITALS — BP 135/77 | HR 67 | Temp 98.1°F | Ht 71.0 in | Wt 256.5 lb

## 2015-11-02 DIAGNOSIS — E669 Obesity, unspecified: Secondary | ICD-10-CM | POA: Diagnosis not present

## 2015-11-02 DIAGNOSIS — I10 Essential (primary) hypertension: Secondary | ICD-10-CM | POA: Diagnosis not present

## 2015-11-02 DIAGNOSIS — E039 Hypothyroidism, unspecified: Secondary | ICD-10-CM

## 2015-11-02 DIAGNOSIS — F411 Generalized anxiety disorder: Secondary | ICD-10-CM | POA: Diagnosis not present

## 2015-11-02 DIAGNOSIS — E119 Type 2 diabetes mellitus without complications: Secondary | ICD-10-CM

## 2015-11-02 DIAGNOSIS — E785 Hyperlipidemia, unspecified: Secondary | ICD-10-CM | POA: Diagnosis not present

## 2015-11-02 DIAGNOSIS — Z23 Encounter for immunization: Secondary | ICD-10-CM

## 2015-11-02 DIAGNOSIS — E1169 Type 2 diabetes mellitus with other specified complication: Secondary | ICD-10-CM

## 2015-11-02 LAB — COMPREHENSIVE METABOLIC PANEL
ALBUMIN: 4 g/dL (ref 3.5–5.2)
ALT: 64 U/L — ABNORMAL HIGH (ref 0–53)
AST: 63 U/L — ABNORMAL HIGH (ref 0–37)
Alkaline Phosphatase: 61 U/L (ref 39–117)
BUN: 20 mg/dL (ref 6–23)
CALCIUM: 9.6 mg/dL (ref 8.4–10.5)
CHLORIDE: 100 meq/L (ref 96–112)
CO2: 30 mEq/L (ref 19–32)
Creatinine, Ser: 0.8 mg/dL (ref 0.40–1.50)
GFR: 101.23 mL/min (ref >60.00)
Glucose, Bld: 133 mg/dL — ABNORMAL HIGH (ref 70–99)
POTASSIUM: 4.1 meq/L (ref 3.5–5.1)
Sodium: 138 mEq/L (ref 135–145)
Total Bilirubin: 0.7 mg/dL (ref 0.2–1.2)
Total Protein: 6.6 g/dL (ref 6.0–8.3)

## 2015-11-02 LAB — TSH: TSH: 1.9 u[IU]/mL (ref 0.35–4.50)

## 2015-11-02 LAB — LIPID PANEL
Cholesterol: 140 mg/dL (ref 0–200)
HDL: 45.7 mg/dL (ref >39.00)
LDL Cholesterol: 78 mg/dL (ref 0–99)
NonHDL: 94.31
TRIGLYCERIDES: 81 mg/dL (ref 0.0–149.0)
Total CHOL/HDL Ratio: 3
VLDL: 16.2 mg/dL (ref 0.0–40.0)

## 2015-11-02 LAB — HEMOGLOBIN A1C: HEMOGLOBIN A1C: 8.7 % — AB (ref 4.6–6.5)

## 2015-11-02 NOTE — Assessment & Plan Note (Signed)
BP Readings from Last 3 Encounters:  11/02/15 135/77  07/28/15 123/65  04/27/15 129/72   BP well controlled. Continue current medication. Renal function with labs.

## 2015-11-02 NOTE — Assessment & Plan Note (Signed)
TSH with labs today. Continue Levothyroxine. 

## 2015-11-02 NOTE — Patient Instructions (Signed)
Labs today.  Follow up in 3 months or sooner as needed. 

## 2015-11-02 NOTE — Assessment & Plan Note (Signed)
Will check lipids and LFTs with labs. Continue Atorvastatin. 

## 2015-11-02 NOTE — Assessment & Plan Note (Signed)
Does not check BG. Will check A1c with labs. Continue Glipizide.

## 2015-11-02 NOTE — Progress Notes (Signed)
Pre visit review using our clinic review tool, if applicable. No additional management support is needed unless otherwise documented below in the visit note. 

## 2015-11-02 NOTE — Progress Notes (Signed)
Subjective:    Patient ID: Timothy Edwards, male    DOB: [birth year], 69 y.o.   MRN: [id number]  HPI  69YO male presents for follow up.  DM - Compliant with medication. Does not check BG.  HTN - No CP, HA, palpitations.  Trying to follow a healthy diet. Staying active. No concerns today.  Wt Readings from Last 3 Encounters:  11/02/15 256 lb 8 oz (116.348 kg)  07/28/15 260 lb 4 oz (118.049 kg)  04/27/15 253 lb 6 oz (114.93 kg)   BP Readings from Last 3 Encounters:  11/02/15 135/77  07/28/15 123/65  04/27/15 129/72    Past Medical History  Diagnosis Date  . Hypertension   . Unspecified hypothyroidism   . Hyperlipidemia   . Depression   . Vitamin D deficiency    Family History  Problem Relation Age of Onset  . Obesity Father    No past surgical history on file. Social History   Social History  . Marital Status: Married    Spouse Name: N/A  . Number of Children: N/A  . Years of Education: N/A   Social History Main Topics  . Smoking status: Never Smoker   . Smokeless tobacco: None  . Alcohol Use: None  . Drug Use: None  . Sexual Activity: Not Asked   Other Topics Concern  . None   Social History Narrative    Review of Systems  Constitutional: Negative for fever, chills, activity change, appetite change, fatigue and unexpected weight change.  Eyes: Negative for visual disturbance.  Respiratory: Negative for cough and shortness of breath.   Cardiovascular: Negative for chest pain, palpitations and leg swelling.  Gastrointestinal: Negative for nausea, vomiting, abdominal pain, diarrhea, constipation and abdominal distention.  Genitourinary: Negative for dysuria, urgency and difficulty urinating.  Musculoskeletal: Negative for arthralgias and gait problem.  Skin: Negative for color change and rash.  Hematological: Negative for adenopathy.  Psychiatric/Behavioral: Negative for sleep disturbance and dysphoric mood. The patient is not nervous/anxious.          Objective:    BP 135/77 mmHg  Pulse 67  Temp(Src) 98.1 F (36.7 C) (Oral)  Ht  (1.803 m)  Wt 256 lb 8 oz (116.348 kg)  BMI 35.79 kg/m2  SpO2 97% Physical Exam  Constitutional: He is oriented to person, place, and time. He appears well-developed and well-nourished. No distress.  HENT:  Head: Normocephalic and atraumatic.  Right Ear: External ear normal.  Left Ear: External ear normal.  Nose: Nose normal.  Mouth/Throat: Oropharynx is clear and moist. No oropharyngeal exudate.  Eyes: Conjunctivae and EOM are normal. Pupils are equal, round, and reactive to light. Right eye exhibits no discharge. Left eye exhibits no discharge. No scleral icterus.  Neck: Normal range of motion. Neck supple. No tracheal deviation present. No thyromegaly present.  Cardiovascular: Normal rate, regular rhythm and normal heart sounds.  Exam reveals no gallop and no friction rub.   No murmur heard. Pulmonary/Chest: Effort normal and breath sounds normal. No accessory muscle usage. No tachypnea. No respiratory distress. He has no decreased breath sounds. He has no wheezes. He has no rhonchi. He has no rales. He exhibits no tenderness.  Musculoskeletal: Normal range of motion. He exhibits no edema.  Lymphadenopathy:    He has no cervical adenopathy.  Neurological: He is alert and oriented to person, place, and time. No cranial nerve deficit. Coordination normal.  Skin: Skin is warm and dry. No rash noted. He is not diaphoretic. No  erythema. No pallor.  Psychiatric: He has a normal mood and affect. His behavior is normal. Judgment and thought content normal.          Assessment & Plan:   Problem List Items Addressed This Visit      High   Diabetes mellitus type 2 in obese (HCC) - Primary    Does not check BG. Will check A1c with labs. Continue Glipizide.      Relevant Orders   Comprehensive metabolic panel   Hemoglobin A1c   TSH   Lipid panel   Generalized anxiety disorder    Hyperlipidemia    Will check lipids and LFTs with labs. Continue Atorvastatin.      Hypertension    BP Readings from Last 3 Encounters:  11/02/15 135/77  07/28/15 123/65  04/27/15 129/72   BP well controlled. Continue current medication. Renal function with labs.      Hypothyroidism    TSH with labs today. Continue Levothyroxine.       Other Visit Diagnoses    Encounter for immunization            Return in about 3 months (around 02/01/2016) for Recheck of Diabetes.

## 2015-11-26 ENCOUNTER — Ambulatory Visit: Payer: 59 | Admitting: Internal Medicine

## 2015-12-13 ENCOUNTER — Other Ambulatory Visit: Payer: Self-pay | Admitting: Internal Medicine

## 2015-12-20 ENCOUNTER — Encounter: Payer: Self-pay | Admitting: Internal Medicine

## 2015-12-20 ENCOUNTER — Ambulatory Visit (INDEPENDENT_AMBULATORY_CARE_PROVIDER_SITE_OTHER): Payer: 59 | Admitting: Internal Medicine

## 2015-12-20 VITALS — BP 138/78 | HR 62 | Temp 98.0°F | Ht 71.0 in | Wt 254.4 lb

## 2015-12-20 DIAGNOSIS — E119 Type 2 diabetes mellitus without complications: Secondary | ICD-10-CM | POA: Diagnosis not present

## 2015-12-20 DIAGNOSIS — E669 Obesity, unspecified: Secondary | ICD-10-CM | POA: Diagnosis not present

## 2015-12-20 DIAGNOSIS — I10 Essential (primary) hypertension: Secondary | ICD-10-CM

## 2015-12-20 DIAGNOSIS — Z23 Encounter for immunization: Secondary | ICD-10-CM

## 2015-12-20 DIAGNOSIS — E785 Hyperlipidemia, unspecified: Secondary | ICD-10-CM

## 2015-12-20 DIAGNOSIS — E1169 Type 2 diabetes mellitus with other specified complication: Secondary | ICD-10-CM

## 2015-12-20 LAB — LIPID PANEL
CHOL/HDL RATIO: 3
Cholesterol: 142 mg/dL (ref 0–200)
HDL: 52 mg/dL (ref >39.00)
LDL CALC: 73 mg/dL (ref 0–99)
NONHDL: 89.86
Triglycerides: 86 mg/dL (ref 0.0–149.0)
VLDL: 17.2 mg/dL (ref 0.0–40.0)

## 2015-12-20 LAB — COMPREHENSIVE METABOLIC PANEL
ALBUMIN: 4.2 g/dL (ref 3.5–5.2)
ALK PHOS: 64 U/L (ref 39–117)
ALT: 54 U/L — AB (ref 0–53)
AST: 51 U/L — ABNORMAL HIGH (ref 0–37)
BILIRUBIN TOTAL: 0.6 mg/dL (ref 0.2–1.2)
BUN: 14 mg/dL (ref 6–23)
CALCIUM: 9.8 mg/dL (ref 8.4–10.5)
CO2: 27 mEq/L (ref 19–32)
Chloride: 103 mEq/L (ref 96–112)
Creatinine, Ser: 0.84 mg/dL (ref 0.40–1.50)
GFR: 95.65 mL/min (ref >60.00)
Glucose, Bld: 122 mg/dL — ABNORMAL HIGH (ref 70–99)
Potassium: 4.6 mEq/L (ref 3.5–5.1)
Sodium: 138 mEq/L (ref 135–145)
TOTAL PROTEIN: 6.9 g/dL (ref 6.0–8.3)

## 2015-12-20 LAB — MICROALBUMIN / CREATININE URINE RATIO
Creatinine,U: 148.5 mg/dL
MICROALB UR: 1.1 mg/dL (ref 0.0–1.9)
Microalb Creat Ratio: 0.7 mg/g (ref 0.0–30.0)

## 2015-12-20 LAB — HEMOGLOBIN A1C: Hgb A1c MFr Bld: 7.7 % — ABNORMAL HIGH (ref 4.6–6.5)

## 2015-12-20 NOTE — Progress Notes (Signed)
Subjective:    Patient ID: Timothy Edwards, male    DOB: Feb 20, 1946, 70 y.o.   MRN: [id number]  HPI  69YO male presents for follow up.  Last visit, A1c noted to be elevated at 8.7%.  He did not increase Glipizide. Misunderstood instructions.  DM - Trying to be more careful about taking medications. BG have been near 110s mostly.   Wt Readings from Last 3 Encounters:  12/20/15 254 lb 6 oz (115.384 kg)  11/02/15 256 lb 8 oz (116.348 kg)  07/28/15 260 lb 4 oz (118.049 kg)   BP Readings from Last 3 Encounters:  12/20/15 138/78  11/02/15 135/77  07/28/15 123/65    Past Medical History  Diagnosis Date  . Hypertension   . Unspecified hypothyroidism   . Hyperlipidemia   . Depression   . Vitamin D deficiency    Family History  Problem Relation Age of Onset  . Obesity Father    No past surgical history on file. Social History   Social History  . Marital Status: Married    Spouse Name: N/A  . Number of Children: N/A  . Years of Education: N/A   Social History Main Topics  . Smoking status: Never Smoker   . Smokeless tobacco: None  . Alcohol Use: None  . Drug Use: None  . Sexual Activity: Not Asked   Other Topics Concern  . None   Social History Narrative    Review of Systems  Constitutional: Negative for fever, chills, activity change, appetite change, fatigue and unexpected weight change.  Eyes: Negative for visual disturbance.  Respiratory: Negative for cough and shortness of breath.   Cardiovascular: Negative for chest pain, palpitations and leg swelling.  Gastrointestinal: Negative for nausea, vomiting, abdominal pain, diarrhea, constipation and abdominal distention.  Genitourinary: Negative for dysuria, urgency and difficulty urinating.  Musculoskeletal: Negative for myalgias, arthralgias and gait problem.  Skin: Negative for color change and rash.  Hematological: Negative for adenopathy.  Psychiatric/Behavioral: Negative for sleep disturbance and  dysphoric mood. The patient is not nervous/anxious.        Objective:    BP 138/78 mmHg  Pulse 62  Temp(Src) 98 F (36.7 C) (Oral)  Ht 5\' 11"  (1.803 m)  Wt 254 lb 6 oz (115.384 kg)  BMI 35.49 kg/m2  SpO2 97% Physical Exam  Constitutional: He is oriented to person, place, and time. He appears well-developed and well-nourished. No distress.  HENT:  Head: Normocephalic and atraumatic.  Right Ear: External ear normal.  Left Ear: External ear normal.  Nose: Nose normal.  Mouth/Throat: Oropharynx is clear and moist. No oropharyngeal exudate.  Eyes: Conjunctivae and EOM are normal. Pupils are equal, round, and reactive to light. Right eye exhibits no discharge. Left eye exhibits no discharge. No scleral icterus.  Neck: Normal range of motion. Neck supple. No tracheal deviation present. No thyromegaly present.  Cardiovascular: Normal rate, regular rhythm and normal heart sounds.  Exam reveals no gallop and no friction rub.   No murmur heard. Pulmonary/Chest: Effort normal and breath sounds normal. No accessory muscle usage. No tachypnea. No respiratory distress. He has no decreased breath sounds. He has no wheezes. He has no rhonchi. He has no rales. He exhibits no tenderness.  Musculoskeletal: Normal range of motion. He exhibits no edema.  Lymphadenopathy:    He has no cervical adenopathy.  Neurological: He is alert and oriented to person, place, and time. No cranial nerve deficit. Coordination normal.  Skin: Skin is warm and dry. No  rash noted. He is not diaphoretic. No erythema. No pallor.  Psychiatric: He has a normal mood and affect. His behavior is normal. Judgment and thought content normal.          Assessment & Plan:   Problem List Items Addressed This Visit      High   Diabetes mellitus type 2 in obese (HCC) - Primary    BG improved by report. Will check A1c with labs today. He has not been taking Glipizide.      Relevant Orders   Comprehensive metabolic panel    Hemoglobin A1c   Lipid panel   Microalbumin / creatinine urine ratio   Hyperlipidemia    Will check lipids and LFTs with labs today. Continue Atorvastatin.      Hypertension    BP Readings from Last 3 Encounters:  12/20/15 138/78  11/02/15 135/77  07/28/15 123/65   BP well controlled. Continue current medication. Renal function with labs.      Obesity (BMI 30-39.9)    Wt Readings from Last 3 Encounters:  12/20/15 254 lb 6 oz (115.384 kg)  11/02/15 256 lb 8 oz (116.348 kg)  07/28/15 260 lb 4 oz (118.049 kg)   Encouraged continued healthy diet and exercise.          Return in about 3 months (around 03/19/2016) for Recheck of Diabetes.

## 2015-12-20 NOTE — Patient Instructions (Signed)
Labs today.   Follow up in 3 months.  

## 2015-12-20 NOTE — Progress Notes (Signed)
Pre visit review using our clinic review tool, if applicable. No additional management support is needed unless otherwise documented below in the visit note. 

## 2015-12-20 NOTE — Assessment & Plan Note (Signed)
BP Readings from Last 3 Encounters:  12/20/15 138/78  11/02/15 135/77  07/28/15 123/65   BP well controlled. Continue current medication. Renal function with labs.

## 2015-12-20 NOTE — Addendum Note (Signed)
Addended by: Marchia MeiersEASTWOOD, Avonell Lenig M on: 12/20/2015 10:20 AM   Modules accepted: Orders

## 2015-12-20 NOTE — Assessment & Plan Note (Signed)
Will check lipids and LFTs with labs today. Continue Atorvastatin. 

## 2015-12-20 NOTE — Assessment & Plan Note (Addendum)
BG improved by report. Will check A1c with labs today. He has not been taking Glipizide.

## 2015-12-20 NOTE — Assessment & Plan Note (Signed)
Wt Readings from Last 3 Encounters:  12/20/15 254 lb 6 oz (115.384 kg)  11/02/15 256 lb 8 oz (116.348 kg)  07/28/15 260 lb 4 oz (118.049 kg)   Encouraged continued healthy diet and exercise.

## 2015-12-29 ENCOUNTER — Other Ambulatory Visit: Payer: Self-pay | Admitting: Internal Medicine

## 2016-01-03 ENCOUNTER — Other Ambulatory Visit: Payer: Self-pay | Admitting: Internal Medicine

## 2016-01-04 ENCOUNTER — Other Ambulatory Visit: Payer: Self-pay | Admitting: Internal Medicine

## 2016-01-17 ENCOUNTER — Other Ambulatory Visit: Payer: Self-pay | Admitting: Internal Medicine

## 2016-01-17 NOTE — Telephone Encounter (Signed)
Pt requesting refill on Mobic. Pt last OV 12/20/15, last filled 09/20/15 #[id number] with3 refills. Please advise, thanks

## 2016-02-02 ENCOUNTER — Ambulatory Visit (INDEPENDENT_AMBULATORY_CARE_PROVIDER_SITE_OTHER): Payer: 59 | Admitting: Internal Medicine

## 2016-02-02 DIAGNOSIS — E119 Type 2 diabetes mellitus without complications: Secondary | ICD-10-CM

## 2016-02-02 DIAGNOSIS — E669 Obesity, unspecified: Secondary | ICD-10-CM

## 2016-02-03 NOTE — Progress Notes (Signed)
cancelled

## 2016-03-13 ENCOUNTER — Other Ambulatory Visit: Payer: Self-pay | Admitting: Internal Medicine

## 2016-03-27 ENCOUNTER — Ambulatory Visit (INDEPENDENT_AMBULATORY_CARE_PROVIDER_SITE_OTHER): Payer: 59 | Admitting: Internal Medicine

## 2016-03-27 ENCOUNTER — Encounter: Payer: Self-pay | Admitting: Internal Medicine

## 2016-03-27 VITALS — BP 132/78 | HR 74 | Ht 71.0 in | Wt 252.1 lb

## 2016-03-27 DIAGNOSIS — E669 Obesity, unspecified: Secondary | ICD-10-CM | POA: Diagnosis not present

## 2016-03-27 DIAGNOSIS — E1169 Type 2 diabetes mellitus with other specified complication: Secondary | ICD-10-CM

## 2016-03-27 DIAGNOSIS — I10 Essential (primary) hypertension: Secondary | ICD-10-CM | POA: Diagnosis not present

## 2016-03-27 DIAGNOSIS — E785 Hyperlipidemia, unspecified: Secondary | ICD-10-CM

## 2016-03-27 DIAGNOSIS — E119 Type 2 diabetes mellitus without complications: Secondary | ICD-10-CM

## 2016-03-27 LAB — COMPREHENSIVE METABOLIC PANEL
ALBUMIN: 4.3 g/dL (ref 3.5–5.2)
ALK PHOS: 64 U/L (ref 39–117)
ALT: 59 U/L — ABNORMAL HIGH (ref 0–53)
AST: 66 U/L — ABNORMAL HIGH (ref 0–37)
BUN: 14 mg/dL (ref 6–23)
CHLORIDE: 99 meq/L (ref 96–112)
CO2: 28 meq/L (ref 19–32)
Calcium: 10.3 mg/dL (ref 8.4–10.5)
Creatinine, Ser: 0.78 mg/dL (ref 0.40–1.50)
GFR: 104.11 mL/min (ref >60.00)
Glucose, Bld: 138 mg/dL — ABNORMAL HIGH (ref 70–99)
POTASSIUM: 4.6 meq/L (ref 3.5–5.1)
SODIUM: 136 meq/L (ref 135–145)
Total Bilirubin: 1 mg/dL (ref 0.2–1.2)
Total Protein: 7.1 g/dL (ref 6.0–8.3)

## 2016-03-27 LAB — LIPID PANEL
CHOL/HDL RATIO: 3
Cholesterol: 153 mg/dL (ref 0–200)
HDL: 50.1 mg/dL (ref >39.00)
LDL Cholesterol: 83 mg/dL (ref 0–99)
NONHDL: 103.21
TRIGLYCERIDES: 103 mg/dL (ref 0.0–149.0)
VLDL: 20.6 mg/dL (ref 0.0–40.0)

## 2016-03-27 LAB — HEMOGLOBIN A1C: Hgb A1c MFr Bld: 7.9 % — ABNORMAL HIGH (ref 4.6–6.5)

## 2016-03-27 NOTE — Patient Instructions (Signed)
Labs today.  Follow up 3 months. 

## 2016-03-27 NOTE — Assessment & Plan Note (Signed)
BP Readings from Last 3 Encounters:  03/27/16 132/78  12/20/15 138/78  11/02/15 135/77   BP well controlled. Renal function with labs.

## 2016-03-27 NOTE — Progress Notes (Signed)
Subjective:    Patient ID: Timothy Edwards, male    DOB: 1946-04-12, 70 y.o.   MRN: [id number]  HPI  69YO male presents for follow up.  DM - BG well controlled by report. Near low 100s mostly. No BG over 250.  HTN - Does not check BP. No CP, HA.  Feeling well. No concerns today.   Wt Readings from Last 3 Encounters:  03/27/16 252 lb 1.9 oz (114.361 kg)  12/20/15 254 lb 6 oz (115.384 kg)  11/02/15 256 lb 8 oz (116.348 kg)   BP Readings from Last 3 Encounters:  03/27/16 132/78  12/20/15 138/78  11/02/15 135/77    Past Medical History  Diagnosis Date  . Hypertension   . Unspecified hypothyroidism   . Hyperlipidemia   . Depression   . Vitamin D deficiency    Family History  Problem Relation Age of Onset  . Obesity Father    No past surgical history on file. Social History   Social History  . Marital Status: Married    Spouse Name: N/A  . Number of Children: N/A  . Years of Education: N/A   Social History Main Topics  . Smoking status: Never Smoker   . Smokeless tobacco: None  . Alcohol Use: None  . Drug Use: None  . Sexual Activity: Not Asked   Other Topics Concern  . None   Social History Narrative    Review of Systems  Constitutional: Negative for fever, chills, activity change, appetite change, fatigue and unexpected weight change.  Eyes: Negative for visual disturbance.  Respiratory: Negative for cough and shortness of breath.   Cardiovascular: Negative for chest pain, palpitations and leg swelling.  Gastrointestinal: Negative for nausea, vomiting, abdominal pain, diarrhea, constipation and abdominal distention.  Genitourinary: Negative for dysuria, urgency and difficulty urinating.  Musculoskeletal: Negative for arthralgias and gait problem.  Skin: Negative for color change and rash.  Hematological: Negative for adenopathy.  Psychiatric/Behavioral: Negative for sleep disturbance and dysphoric mood. The patient is not nervous/anxious.         Objective:    BP 132/78 mmHg  Pulse 74  Ht  (1.803 m)  Wt 252 lb 1.9 oz (114.361 kg)  BMI 35.18 kg/m2  SpO2 95% Physical Exam  Constitutional: He is oriented to person, place, and time. He appears well-developed and well-nourished. No distress.  HENT:  Head: Normocephalic and atraumatic.  Right Ear: External ear normal.  Left Ear: External ear normal.  Nose: Nose normal.  Mouth/Throat: Oropharynx is clear and moist. No oropharyngeal exudate.  Eyes: Conjunctivae and EOM are normal. Pupils are equal, round, and reactive to light. Right eye exhibits no discharge. Left eye exhibits no discharge. No scleral icterus.  Neck: Normal range of motion. Neck supple. No tracheal deviation present. No thyromegaly present.  Cardiovascular: Normal rate, regular rhythm and normal heart sounds.  Exam reveals no gallop and no friction rub.   No murmur heard. Pulmonary/Chest: Effort normal and breath sounds normal. No accessory muscle usage. No tachypnea. No respiratory distress. He has no decreased breath sounds. He has no wheezes. He has no rhonchi. He has no rales. He exhibits no tenderness.  Musculoskeletal: Normal range of motion. He exhibits no edema.  Lymphadenopathy:    He has no cervical adenopathy.  Neurological: He is alert and oriented to person, place, and time. No cranial nerve deficit. Coordination normal.  Skin: Skin is warm and dry. No rash noted. He is not diaphoretic. No erythema. No pallor.  Psychiatric: He has a normal mood and affect. His behavior is normal. Judgment and thought content normal.          Assessment & Plan:   Problem List Items Addressed This Visit      High   Diabetes mellitus type 2 in obese (HCC) - Primary    BG well controlled by report. A1c with labs.      Relevant Orders   Comprehensive metabolic panel   Hemoglobin A1c   Lipid panel   Hyperlipidemia    Will check lipids and LFTs with labs. Continue Atorvastatin.       Hypertension    BP Readings from Last 3 Encounters:  03/27/16 132/78  12/20/15 138/78  11/02/15 135/77   BP well controlled. Renal function with labs.          Return in about 3 months (around 06/26/2016) for Recheck of Diabetes.  Ronna PolioJennifer Walker, MD Internal Medicine [facility]

## 2016-03-27 NOTE — Assessment & Plan Note (Signed)
Will check lipids and LFTs with labs. Continue Atorvastatin. 

## 2016-03-27 NOTE — Assessment & Plan Note (Signed)
BG well controlled by report. A1c with labs. 

## 2016-05-11 ENCOUNTER — Telehealth: Payer: Self-pay

## 2016-05-11 ENCOUNTER — Ambulatory Visit (INDEPENDENT_AMBULATORY_CARE_PROVIDER_SITE_OTHER): Payer: 59 | Admitting: Internal Medicine

## 2016-05-11 ENCOUNTER — Ambulatory Visit
Admission: RE | Admit: 2016-05-11 | Discharge: 2016-05-11 | Disposition: A | Discharge status: Home / Self Care | Payer: 59 | Source: Ambulatory Visit | Attending: Internal Medicine | Admitting: Internal Medicine

## 2016-05-11 ENCOUNTER — Encounter: Payer: Self-pay | Admitting: Internal Medicine

## 2016-05-11 ENCOUNTER — Other Ambulatory Visit: Payer: Self-pay | Admitting: Internal Medicine

## 2016-05-11 VITALS — BP 130/76 | HR 75 | Temp 98.2°F | Wt 257.4 lb

## 2016-05-11 DIAGNOSIS — M7122 Synovial cyst of popliteal space [Baker], left knee: Secondary | ICD-10-CM | POA: Insufficient documentation

## 2016-05-11 DIAGNOSIS — M25562 Pain in left knee: Secondary | ICD-10-CM | POA: Diagnosis not present

## 2016-05-11 DIAGNOSIS — M23322 Other meniscus derangements, posterior horn of medial meniscus, left knee: Secondary | ICD-10-CM | POA: Diagnosis not present

## 2016-05-11 DIAGNOSIS — S83282A Other tear of lateral meniscus, current injury, left knee, initial encounter: Secondary | ICD-10-CM

## 2016-05-11 DIAGNOSIS — M25462 Effusion, left knee: Secondary | ICD-10-CM | POA: Diagnosis not present

## 2016-05-11 MED ORDER — HYDROCODONE-ACETAMINOPHEN 5-325 MG PO TABS: 1.0000 | ORAL_TABLET | Freq: Three times a day (TID) | ORAL | Status: DC | PRN

## 2016-05-11 NOTE — Telephone Encounter (Signed)
Patient was informed of results.  Patient would like to speak with Dr. Dan HumphreysWalker about his options. Also selecting a provider and a more details about his results.

## 2016-05-11 NOTE — Assessment & Plan Note (Signed)
Left lateral knee pain, sudden onset, now with instability. Suspect lateral meniscal tear. Will get MRI of left knee for further evaluation. Continue Meloxicam.and add prn Hydrocodone for pain. Follow up after MRI.

## 2016-05-11 NOTE — Progress Notes (Signed)
Pre visit review using our clinic review tool, if applicable. No additional management support is needed unless otherwise documented below in the visit note. 

## 2016-05-11 NOTE — Patient Instructions (Signed)
We will set up MRI of left knee.  Use Hydrocodone as needed for severe pain only.  Follow up after MRI.

## 2016-05-11 NOTE — Addendum Note (Signed)
Addended by: Ronna PolioWALKER, Ciclaly Mulcahey A on: 05/11/2016 08:35 AM   Modules accepted: Orders

## 2016-05-11 NOTE — Progress Notes (Signed)
Subjective:    Patient ID: Timothy Edwards, male    DOB: 1946/09/29, 70 y.o.   MRN: [id number]  HPI  69YO male presents for acute visit.  Left leg pain - Sudden onset left lateral knee pain when stepping up on chair two weeks ago. Leg gave way. No swelling noted. Now feels unstable walking. Pain improved some with rest.  Took some left over pain medication from dentist with some improvement.  Wt Readings from Last 3 Encounters:  05/11/16 257 lb 6 oz (116.745 kg)  03/27/16 252 lb 1.9 oz (114.361 kg)  12/20/15 254 lb 6 oz (115.384 kg)   BP Readings from Last 3 Encounters:  05/11/16 130/76  03/27/16 132/78  12/20/15 138/78    Past Medical History  Diagnosis Date  . Hypertension   . Unspecified hypothyroidism   . Hyperlipidemia   . Depression   . Vitamin D deficiency    Family History  Problem Relation Age of Onset  . Obesity Father    No past surgical history on file. Social History   Social History  . Marital Status: Married    Spouse Name: N/A  . Number of Children: N/A  . Years of Education: N/A   Social History Main Topics  . Smoking status: Never Smoker   . Smokeless tobacco: None  . Alcohol Use: None  . Drug Use: None  . Sexual Activity: Not Asked   Other Topics Concern  . None   Social History Narrative    Review of Systems  Constitutional: Negative for fever, chills, activity change, appetite change, fatigue and unexpected weight change.  Eyes: Negative for visual disturbance.  Respiratory: Negative for cough and shortness of breath.   Cardiovascular: Negative for chest pain, palpitations and leg swelling.  Gastrointestinal: Negative for abdominal pain and abdominal distention.  Genitourinary: Negative for dysuria, urgency and difficulty urinating.  Musculoskeletal: Positive for myalgias, arthralgias and gait problem. Negative for joint swelling.  Skin: Negative for color change and rash.  Neurological: Positive for weakness. Negative for  numbness.  Hematological: Negative for adenopathy.  Psychiatric/Behavioral: Negative for sleep disturbance and dysphoric mood. The patient is not nervous/anxious.        Objective:    BP 130/76 mmHg  Pulse 75  Temp(Src) 98.2 F (36.8 C) (Oral)  Wt 257 lb 6 oz (116.745 kg)  SpO2 95% Physical Exam  Constitutional: He appears well-developed and well-nourished. No distress.  HENT:  Head: Normocephalic.  Eyes: Pupils are equal, round, and reactive to light.  Neck: Normal range of motion.  Pulmonary/Chest: Effort normal.  Musculoskeletal:       Left knee: He exhibits normal range of motion, no swelling and no bony tenderness. Tenderness found. Lateral joint line tenderness noted.  Skin: He is not diaphoretic.          Assessment & Plan:   Problem List Items Addressed This Visit      Unprioritized   Left lateral knee pain - Primary    Left lateral knee pain, sudden onset, now with instability. Suspect lateral meniscal tear. Will get MRI of left knee for further evaluation. Continue Meloxicam.and add prn Hydrocodone for pain. Follow up after MRI.      Relevant Medications   HYDROcodone-acetaminophen (NORCO/VICODIN) 5-325 MG tablet   Other Relevant Orders   MR Knee Left  Wo Contrast       Return if symptoms worsen or fail to improve.  Ronna PolioJennifer Walker, MD Internal Medicine [facility]

## 2016-05-15 DIAGNOSIS — S83262A Peripheral tear of lateral meniscus, current injury, left knee, initial encounter: Secondary | ICD-10-CM | POA: Diagnosis not present

## 2016-05-15 DIAGNOSIS — M179 Osteoarthritis of knee, unspecified: Secondary | ICD-10-CM | POA: Insufficient documentation

## 2016-05-15 DIAGNOSIS — S83209A Unspecified tear of unspecified meniscus, current injury, unspecified knee, initial encounter: Secondary | ICD-10-CM | POA: Insufficient documentation

## 2016-05-15 DIAGNOSIS — M1712 Unilateral primary osteoarthritis, left knee: Secondary | ICD-10-CM | POA: Diagnosis not present

## 2016-05-17 ENCOUNTER — Other Ambulatory Visit: Payer: Self-pay | Admitting: Internal Medicine

## 2016-05-18 DIAGNOSIS — M1712 Unilateral primary osteoarthritis, left knee: Secondary | ICD-10-CM | POA: Diagnosis not present

## 2016-06-19 ENCOUNTER — Other Ambulatory Visit: Payer: Self-pay | Admitting: Internal Medicine

## 2016-06-27 ENCOUNTER — Ambulatory Visit (INDEPENDENT_AMBULATORY_CARE_PROVIDER_SITE_OTHER): Payer: 59 | Admitting: Internal Medicine

## 2016-06-27 ENCOUNTER — Encounter: Payer: Self-pay | Admitting: Internal Medicine

## 2016-06-27 VITALS — BP 124/70 | HR 72 | Ht 71.0 in | Wt 240.0 lb

## 2016-06-27 DIAGNOSIS — I10 Essential (primary) hypertension: Secondary | ICD-10-CM

## 2016-06-27 DIAGNOSIS — E785 Hyperlipidemia, unspecified: Secondary | ICD-10-CM

## 2016-06-27 DIAGNOSIS — E669 Obesity, unspecified: Secondary | ICD-10-CM

## 2016-06-27 DIAGNOSIS — E1169 Type 2 diabetes mellitus with other specified complication: Secondary | ICD-10-CM

## 2016-06-27 DIAGNOSIS — E119 Type 2 diabetes mellitus without complications: Secondary | ICD-10-CM | POA: Diagnosis not present

## 2016-06-27 LAB — COMPREHENSIVE METABOLIC PANEL
ALK PHOS: 76 U/L (ref 39–117)
ALT: 55 U/L — AB (ref 0–53)
AST: 68 U/L — AB (ref 0–37)
Albumin: 4.4 g/dL (ref 3.5–5.2)
BUN: 15 mg/dL (ref 6–23)
CO2: 30 mEq/L (ref 19–32)
Calcium: 10 mg/dL (ref 8.4–10.5)
Chloride: 96 mEq/L (ref 96–112)
Creatinine, Ser: 0.84 mg/dL (ref 0.40–1.50)
GFR: 95.51 mL/min (ref >60.00)
GLUCOSE: 295 mg/dL — AB (ref 70–99)
POTASSIUM: 4.6 meq/L (ref 3.5–5.1)
SODIUM: 133 meq/L — AB (ref 135–145)
TOTAL PROTEIN: 7.3 g/dL (ref 6.0–8.3)
Total Bilirubin: 1.1 mg/dL (ref 0.2–1.2)

## 2016-06-27 LAB — LIPID PANEL
CHOL/HDL RATIO: 3
Cholesterol: 159 mg/dL (ref 0–200)
HDL: 48.2 mg/dL (ref >39.00)
LDL Cholesterol: 92 mg/dL (ref 0–99)
NONHDL: 111.26
Triglycerides: 97 mg/dL (ref 0.0–149.0)
VLDL: 19.4 mg/dL (ref 0.0–40.0)

## 2016-06-27 LAB — HEMOGLOBIN A1C: Hgb A1c MFr Bld: 11.8 % — ABNORMAL HIGH (ref 4.6–6.5)

## 2016-06-27 LAB — MICROALBUMIN / CREATININE URINE RATIO
Creatinine,U: 167.3 mg/dL
MICROALB/CREAT RATIO: 1.8 mg/g (ref 0.0–30.0)
Microalb, Ur: 3 mg/dL — ABNORMAL HIGH (ref 0.0–1.9)

## 2016-06-27 NOTE — Assessment & Plan Note (Signed)
BP Readings from Last 3 Encounters:  06/27/16 124/70  05/11/16 130/76  03/27/16 132/78   BP well controlled. Renal function with labs. Continue current medications.

## 2016-06-27 NOTE — Assessment & Plan Note (Signed)
Encouraged healthy diet and exercise. A1c with labs today. Continue Glipizide and Metformin.

## 2016-06-27 NOTE — Progress Notes (Signed)
Subjective:    Patient ID: Timothy Edwards, male    DOB: 1946/06/10, 70 y.o.   MRN: [id number]  HPI  69YO male presents for follow up.  DM - Checking BG periodically, some high. Notes some dietary indiscretion.  Lab Results  Component Value Date   HGBA1C 7.9 (H) 03/27/2016   HTN - No CP, HA. Compliant with medication.  Wt Readings from Last 3 Encounters:  06/27/16 240 lb (108.9 kg)  05/11/16 257 lb 6 oz (116.7 kg)  03/27/16 252 lb 1.9 oz (114.4 kg)   BP Readings from Last 3 Encounters:  06/27/16 124/70  05/11/16 130/76  03/27/16 132/78    Past Medical History:  Diagnosis Date  . Depression   . Hyperlipidemia   . Hypertension   . Unspecified hypothyroidism   . Vitamin D deficiency    Family History  Problem Relation Age of Onset  . Obesity Father    No past surgical history on file. Social History   Social History  . Marital status: Married    Spouse name: N/A  . Number of children: N/A  . Years of education: N/A   Social History Main Topics  . Smoking status: Never Smoker  . Smokeless tobacco: None  . Alcohol use None  . Drug use: Unknown  . Sexual activity: Not Asked   Other Topics Concern  . None   Social History Narrative  . None    Review of Systems  Constitutional: Negative for activity change, appetite change, chills, fatigue, fever and unexpected weight change.  Eyes: Negative for visual disturbance.  Respiratory: Negative for cough and shortness of breath.   Cardiovascular: Negative for chest pain, palpitations and leg swelling.  Gastrointestinal: Negative for abdominal distention, abdominal pain, constipation, diarrhea, nausea and vomiting.  Genitourinary: Negative for difficulty urinating, dysuria and urgency.  Musculoskeletal: Negative for arthralgias and gait problem.  Skin: Negative for color change and rash.  Hematological: Negative for adenopathy.  Psychiatric/Behavioral: Negative for dysphoric mood and sleep disturbance.  The patient is not nervous/anxious.        Objective:    BP 124/70 (BP Location: Left Arm, Patient Position: Sitting, Cuff Size: Large)   Pulse 72   Ht  (1.803 m)   Wt 240 lb (108.9 kg)   SpO2 94%   BMI 33.47 kg/m  Physical Exam  Constitutional: He is oriented to person, place, and time. He appears well-developed and well-nourished. No distress.  HENT:  Head: Normocephalic and atraumatic.  Right Ear: External ear normal.  Left Ear: External ear normal.  Nose: Nose normal.  Mouth/Throat: Oropharynx is clear and moist. No oropharyngeal exudate.  Eyes: Conjunctivae and EOM are normal. Pupils are equal, round, and reactive to light. Right eye exhibits no discharge. Left eye exhibits no discharge. No scleral icterus.  Neck: Normal range of motion. Neck supple. No tracheal deviation present. No thyromegaly present.  Cardiovascular: Normal rate, regular rhythm and normal heart sounds.  Exam reveals no gallop and no friction rub.   No murmur heard. Pulmonary/Chest: Effort normal and breath sounds normal. No accessory muscle usage. No tachypnea. No respiratory distress. He has no decreased breath sounds. He has no wheezes. He has no rhonchi. He has no rales. He exhibits no tenderness.  Musculoskeletal: Normal range of motion. He exhibits no edema.  Lymphadenopathy:    He has no cervical adenopathy.  Neurological: He is alert and oriented to person, place, and time. No cranial nerve deficit. Coordination normal.  Skin: Skin is  warm and dry. No rash noted. He is not diaphoretic. No erythema. No pallor.  Psychiatric: He has a normal mood and affect. His behavior is normal. Judgment and thought content normal.          Assessment & Plan:   Problem List Items Addressed This Visit      High   Diabetes mellitus type 2 in obese (HCC) - Primary    Encouraged healthy diet and exercise. A1c with labs today. Continue Glipizide and Metformin.      Relevant Orders   Comprehensive  metabolic panel   Hemoglobin A1c   Lipid panel   Microalbumin / creatinine urine ratio   Hyperlipidemia   Hypertension    BP Readings from Last 3 Encounters:  06/27/16 124/70  05/11/16 130/76  03/27/16 132/78   BP well controlled. Renal function with labs. Continue current medications.      Obesity (BMI 30-39.9)    Wt Readings from Last 3 Encounters:  06/27/16 240 lb (108.9 kg)  05/11/16 257 lb 6 oz (116.7 kg)  03/27/16 252 lb 1.9 oz (114.4 kg)   Congratulated pt on weight loss. Encouraged healthy diet and exercise.       Other Visit Diagnoses   None.      Return in about 3 months (around 09/27/2016) for New Patient.  Ronna Polio, MD Internal Medicine [facility]

## 2016-06-27 NOTE — Assessment & Plan Note (Signed)
Wt Readings from Last 3 Encounters:  06/27/16 240 lb (108.9 kg)  05/11/16 257 lb 6 oz (116.7 kg)  03/27/16 252 lb 1.9 oz (114.4 kg)   Congratulated pt on weight loss. Encouraged healthy diet and exercise.

## 2016-06-27 NOTE — Progress Notes (Signed)
Pre visit review using our clinic review tool, if applicable. No additional management support is needed unless otherwise documented below in the visit note. 

## 2016-06-27 NOTE — Patient Instructions (Signed)
Labs today.   Follow up in 3 months.  

## 2016-06-28 ENCOUNTER — Other Ambulatory Visit: Payer: Self-pay

## 2016-06-28 MED ORDER — GLIPIZIDE ER 5 MG PO TB24: 5.0000 mg | ORAL_TABLET | Freq: Two times a day (BID) | ORAL | 1 refills | Status: DC

## 2016-06-28 NOTE — Telephone Encounter (Signed)
Medication dose adjusted per lab results/ Dr. Dan Humphreys.

## 2016-07-03 ENCOUNTER — Other Ambulatory Visit: Payer: Self-pay | Admitting: Internal Medicine

## 2016-07-03 ENCOUNTER — Ambulatory Visit (INDEPENDENT_AMBULATORY_CARE_PROVIDER_SITE_OTHER): Payer: 59 | Admitting: Internal Medicine

## 2016-07-03 ENCOUNTER — Encounter: Payer: Self-pay | Admitting: Internal Medicine

## 2016-07-03 VITALS — BP 144/68 | HR 64 | Ht 71.0 in | Wt 240.0 lb

## 2016-07-03 DIAGNOSIS — E1169 Type 2 diabetes mellitus with other specified complication: Secondary | ICD-10-CM

## 2016-07-03 DIAGNOSIS — E669 Obesity, unspecified: Secondary | ICD-10-CM

## 2016-07-03 DIAGNOSIS — E119 Type 2 diabetes mellitus without complications: Secondary | ICD-10-CM

## 2016-07-03 NOTE — Assessment & Plan Note (Signed)
Lab Results  Component Value Date   HGBA1C 11.8 (H) 06/27/2016   Discussed risk of A1c at this level. He has already made significant changes in compliance with medication. Now taking Glipizide 5mg  bid. He prefers not to add medication today. Gave some parameters for carbohydrate intake and blood sugar levels. He will continue to record BG readings and bring to next visit.

## 2016-07-03 NOTE — Patient Instructions (Addendum)
Goal Fasting blood sugars 80-120.  Continue Glipizide 5mg  twice daily.  Limit carbohydrate intake to less than 100gm per day.  Follow up in 3 months.

## 2016-07-03 NOTE — Progress Notes (Signed)
Subjective:    Patient ID: Timothy Edwards, male    DOB: 1946-01-26, 70 y.o.   MRN: [id number]  HPI  69YO male presents for follow up.  Recently noted to have elevated BG. Was not taking Glipizide. Lab Results  Component Value Date   HGBA1C 11.8 (H) 06/27/2016   Since learning of results, has been more compliant with medication. Fasting BG now in low 100s.   Wt Readings from Last 3 Encounters:  07/03/16 240 lb (108.9 kg)  06/27/16 240 lb (108.9 kg)  05/11/16 257 lb 6 oz (116.7 kg)   BP Readings from Last 3 Encounters:  07/03/16 (!) 144/68  06/27/16 124/70  05/11/16 130/76    Past Medical History:  Diagnosis Date  . Depression   . Hyperlipidemia   . Hypertension   . Unspecified hypothyroidism   . Vitamin D deficiency    Family History  Problem Relation Age of Onset  . Obesity Father    No past surgical history on file. Social History   Social History  . Marital status: Married    Spouse name: N/A  . Number of children: N/A  . Years of education: N/A   Social History Main Topics  . Smoking status: Never Smoker  . Smokeless tobacco: None  . Alcohol use None  . Drug use: Unknown  . Sexual activity: Not Asked   Other Topics Concern  . None   Social History Narrative  . None    Review of Systems  Constitutional: Negative for activity change, appetite change, chills, fatigue, fever and unexpected weight change.  Eyes: Negative for visual disturbance.  Respiratory: Negative for cough and shortness of breath.   Cardiovascular: Negative for chest pain, palpitations and leg swelling.  Gastrointestinal: Negative for abdominal distention, abdominal pain and diarrhea.  Endocrine: Negative for polydipsia, polyphagia and polyuria.  Genitourinary: Negative for difficulty urinating, dysuria and urgency.  Musculoskeletal: Negative for arthralgias and gait problem.  Skin: Negative for color change and rash.  Hematological: Negative for adenopathy.    Psychiatric/Behavioral: Negative for dysphoric mood and sleep disturbance. The patient is not nervous/anxious.        Objective:    BP (!) 144/68 (BP Location: Left Arm, Patient Position: Sitting, Cuff Size: Large)   Pulse 64   Ht 5\' 11"  (1.803 m)   Wt 240 lb (108.9 kg)   SpO2 94%   BMI 33.47 kg/m  Physical Exam  Constitutional: He is oriented to person, place, and time. He appears well-developed and well-nourished. No distress.  HENT:  Head: Normocephalic and atraumatic.  Right Ear: External ear normal.  Left Ear: External ear normal.  Nose: Nose normal.  Mouth/Throat: Oropharynx is clear and moist. No oropharyngeal exudate.  Eyes: Conjunctivae and EOM are normal. Pupils are equal, round, and reactive to light. Right eye exhibits no discharge. Left eye exhibits no discharge. No scleral icterus.  Neck: Normal range of motion. Neck supple. No tracheal deviation present. No thyromegaly present.  Cardiovascular: Normal rate, regular rhythm and normal heart sounds.  Exam reveals no gallop and no friction rub.   No murmur heard. Pulmonary/Chest: Effort normal and breath sounds normal. No accessory muscle usage. No tachypnea. No respiratory distress. He has no decreased breath sounds. He has no wheezes. He has no rhonchi. He has no rales. He exhibits no tenderness.  Musculoskeletal: Normal range of motion. He exhibits no edema.  Lymphadenopathy:    He has no cervical adenopathy.  Neurological: He is alert and oriented to person,  place, and time. No cranial nerve deficit. Coordination normal.  Skin: Skin is warm and dry. No rash noted. He is not diaphoretic. No erythema. No pallor.  Psychiatric: He has a normal mood and affect. His behavior is normal. Judgment and thought content normal.          Assessment & Plan:  Over of which >50% spent in face-to-face contact with patient discussing plan of care  Problem List Items Addressed This Visit      High   Diabetes mellitus  type 2 in obese [facility]) - Primary    Lab Results  Component Value Date   HGBA1C 11.8 (H) 06/27/2016   Discussed risk of A1c at this level. He has already made significant changes in compliance with medication. Now taking Glipizide  bid. He prefers not to add medication today. Gave some parameters for carbohydrate intake and blood sugar levels. He will continue to record BG readings and bring to next visit.       Other Visit Diagnoses   None.      Return in about 3 months (around 10/03/2016) for New Patient.  Ronna Polio, MD Internal Medicine [facility]

## 2016-07-03 NOTE — Progress Notes (Signed)
Pre visit review using our clinic review tool, if applicable. No additional management support is needed unless otherwise documented below in the visit note. 

## 2016-07-22 DIAGNOSIS — K047 Periapical abscess without sinus: Secondary | ICD-10-CM | POA: Diagnosis not present

## 2016-09-01 ENCOUNTER — Telehealth: Payer: Self-pay | Admitting: Family Medicine

## 2016-09-01 MED ORDER — METFORMIN HCL 500 MG PO TABS: ORAL_TABLET | ORAL | 1 refills | Status: DC

## 2016-09-01 NOTE — Telephone Encounter (Signed)
RX sent to pharmacy  

## 2016-09-01 NOTE — Telephone Encounter (Signed)
Pt called and stated that he called the pharmacy and has no more refills for his metFORMIN (GLUCOPHAGE) 500 MG tablet. He is out of his medication today. Thank you!  [facility] [place]  Call pt @ [phone]

## 2016-09-01 NOTE — Telephone Encounter (Signed)
Ok to fill right he is establishing with dr. De NurseSonneberg?

## 2016-09-08 ENCOUNTER — Other Ambulatory Visit: Payer: Self-pay

## 2016-09-08 MED ORDER — GLIPIZIDE ER 5 MG PO TB24: 5.0000 mg | ORAL_TABLET | Freq: Two times a day (BID) | ORAL | 1 refills | Status: DC

## 2016-09-08 NOTE — Telephone Encounter (Signed)
Refill sent to pharmacy. Patient needs to keep the follow-up appointment.

## 2016-09-28 ENCOUNTER — Encounter: Payer: Self-pay | Admitting: Family Medicine

## 2016-09-28 ENCOUNTER — Ambulatory Visit (INDEPENDENT_AMBULATORY_CARE_PROVIDER_SITE_OTHER): Payer: 59 | Admitting: Family Medicine

## 2016-09-28 VITALS — BP 132/86 | HR 65 | Temp 98.2°F | Wt 239.4 lb

## 2016-09-28 DIAGNOSIS — M25562 Pain in left knee: Secondary | ICD-10-CM

## 2016-09-28 DIAGNOSIS — E1141 Type 2 diabetes mellitus with diabetic mononeuropathy: Secondary | ICD-10-CM

## 2016-09-28 DIAGNOSIS — E669 Obesity, unspecified: Secondary | ICD-10-CM

## 2016-09-28 DIAGNOSIS — Z23 Encounter for immunization: Secondary | ICD-10-CM | POA: Diagnosis not present

## 2016-09-28 DIAGNOSIS — E1169 Type 2 diabetes mellitus with other specified complication: Secondary | ICD-10-CM | POA: Diagnosis not present

## 2016-09-28 DIAGNOSIS — E039 Hypothyroidism, unspecified: Secondary | ICD-10-CM

## 2016-09-28 LAB — COMPREHENSIVE METABOLIC PANEL
ALT: 26 U/L (ref 0–53)
AST: 27 U/L (ref 0–37)
Albumin: 4.2 g/dL (ref 3.5–5.2)
Alkaline Phosphatase: 63 U/L (ref 39–117)
BILIRUBIN TOTAL: 0.8 mg/dL (ref 0.2–1.2)
BUN: 14 mg/dL (ref 6–23)
CO2: 29 meq/L (ref 19–32)
Calcium: 9.7 mg/dL (ref 8.4–10.5)
Chloride: 103 mEq/L (ref 96–112)
Creatinine, Ser: 0.82 mg/dL (ref 0.40–1.50)
GFR: 98.13 mL/min (ref >60.00)
GLUCOSE: 89 mg/dL (ref 70–99)
Potassium: 4.1 mEq/L (ref 3.5–5.1)
SODIUM: 140 meq/L (ref 135–145)
TOTAL PROTEIN: 6.6 g/dL (ref 6.0–8.3)

## 2016-09-28 LAB — HEMOGLOBIN A1C: HEMOGLOBIN A1C: 6 % (ref 4.6–6.5)

## 2016-09-28 MED ORDER — GLIPIZIDE ER 5 MG PO TB24: 5.0000 mg | ORAL_TABLET | Freq: Every day | ORAL | 1 refills | Status: DC

## 2016-09-28 NOTE — Progress Notes (Signed)
Pre visit review using our clinic review tool, if applicable. No additional management support is needed unless otherwise documented below in the visit note. 

## 2016-09-28 NOTE — Assessment & Plan Note (Signed)
Asymptomatic. Taking Synthroid. Check TSH.

## 2016-09-28 NOTE — Patient Instructions (Signed)
Nice to see you. We are going to check some lab work today and call you the results. Please see your orthopedist regarding your left knee. You should take one glipizide a day. If you develop persistent low blood sugars less than 80 please let us know.

## 2016-09-28 NOTE — Progress Notes (Signed)
  Timothy Rumps, MD Phone: [phone]  Timothy Edwards is a 70 y.o. male who presents today for f/u.  DIABETES Disease Monitoring: Blood Sugar ranges-never over 104 Polyuria/phagia/dipsia- no      Visual problems- no, has not seen ophthalmology recently Medications: Compliance- taking metformin twice daily, taking glipizide XR twice daily Hypoglycemic symptoms- yes, into the 50s if he doesn't eat lunch, get shaky and sweaty and eat something and this improves.  HYPOTHYROIDISM Disease Monitoring Weight changes: Has lost some weight though has been trying  Skin Changes: No Heat/Cold intolerance: No  Medication Monitoring Compliance:  Taking Synthroid   Last TSH:   Lab Results  Component Value Date   TSH 1.90 11/02/2015   Left knee pain: Patient notes history of meniscal injury. Had an injection 4 months ago. Notes this helped significantly. Followed by orthopedics for this. Sometimes his knee clicks. Sometimes it hurts. Can also give out on him. Not currently taking anything for pain.   PMH: nonsmoker.   ROS see history of present illness  Objective  Physical Exam Vitals:   09/28/16 1001  BP: 132/86  Pulse: 65  Temp: 98.2 F (36.8 C)    BP Readings from Last 3 Encounters:  09/28/16 132/86  07/03/16 (!) 144/68  06/27/16 124/70   Wt Readings from Last 3 Encounters:  09/28/16 239 lb 6.4 oz (108.6 kg)  07/03/16 240 lb (108.9 kg)  06/27/16 240 lb (108.9 kg)    Physical Exam  Constitutional: He is well-developed, well-nourished, and in no distress.  Cardiovascular: Normal rate, regular rhythm and normal heart sounds.   Pulmonary/Chest: Effort normal and breath sounds normal.  Musculoskeletal: He exhibits no edema.  Bilateral knees with no swelling, warmth, erythema, or joint line tenderness, negative McMurray's, no ligamentous laxity  Neurological: He is alert.  Skin: Skin is warm and dry.   Diabetic Foot Exam - Simple   Simple Foot Form Diabetic Foot  exam was performed with the following findings:  Yes 09/28/2016 10:28 AM  Visual Inspection No deformities, no ulcerations, no other skin breakdown bilaterally:  Yes Sensation Testing See comments:  Yes Pulse Check Posterior Tibialis and Dorsalis pulse intact bilaterally:  Yes Comments Decreased monofilament testing in right great toe the patient reports is at baseline since he had a foot injury a number of years ago, otherwise intact to monofilament and light touch     Assessment/Plan: Please see individual problem list.  Diabetes mellitus type 2 in obese Appears to be much better controlled based on CBGs at home. Getting hypoglycemic at times. We will decrease his glipizide to once daily. He'll continue metformin. We'll check an A1c today. Foot exam performed. Advised to see an eye doctor.  Left lateral knee pain Has had recurrent issues with this. Advised to follow-up with orthopedics for consideration of repeat injection versus meniscal repair.  Hypothyroidism Asymptomatic. Taking Synthroid. Check TSH.   Orders Placed This Encounter  Procedures  . Flu vaccine HIGH DOSE PF  . HgB A1c  . Comp Met (CMET)  . TSH    Meds ordered this encounter  Medications  . glipiZIDE (GLUCOTROL XL) 5 MG 24 hr tablet    Sig: Take 1 tablet (5 mg total) by mouth daily with breakfast.    Dispense:  90 tablet    Refill:  [place], [place]

## 2016-09-28 NOTE — Assessment & Plan Note (Signed)
Has had recurrent issues with this. Advised to follow-up with orthopedics for consideration of repeat injection versus meniscal repair.

## 2016-09-28 NOTE — Assessment & Plan Note (Signed)
Appears to be much better controlled based on CBGs at home. Getting hypoglycemic at times. We will decrease his glipizide to once daily. He'll continue metformin. We'll check an A1c today. Foot exam performed. Advised to see an eye doctor.

## 2016-10-02 LAB — TSH: TSH: 1.36 u[IU]/mL (ref 0.35–4.50)

## 2016-10-17 ENCOUNTER — Other Ambulatory Visit: Payer: Self-pay | Admitting: Family Medicine

## 2016-10-17 MED ORDER — MELOXICAM 15 MG PO TABS
15.0000 mg | ORAL_TABLET | Freq: Every day | ORAL | 3 refills | Status: DC | PRN
Start: 2016-10-17 — End: 2016-12-22

## 2016-10-17 NOTE — Telephone Encounter (Signed)
Sent to pharmacy 

## 2016-10-17 NOTE — Telephone Encounter (Signed)
Refilled on 05/17/16. Pt last seen 09/28/16. Future appt on 12/29/16. Please advise?

## 2016-11-03 IMAGING — MR MR KNEE*L* W/O CM
6 series · 35 of 40 positions shown · non-contrast
Comparison: None.

CLINICAL DATA: Lateral knee pain after stepping up onto a chair.
Persistent pain since that time

EXAM:
MRI OF THE LEFT KNEE WITHOUT CONTRAST
TECHNIQUE: Multiplanar, multisequence MR imaging of the knee was performed. No
intravenous contrast was administered.

[Series 3: PD fat-sat · axial · 3.0mm · 0.50mm/px · z∈[-69,+50]mm · 8 of 37 slices shown (1 of 4)]
[im 1/37]
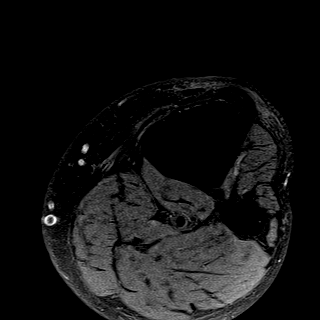
[im 6/37]
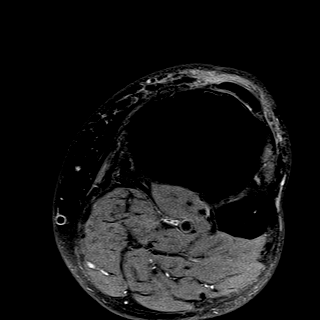
[im 11/37]
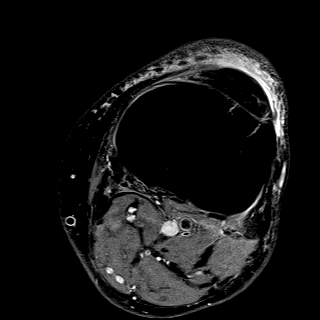
[im 16/37]
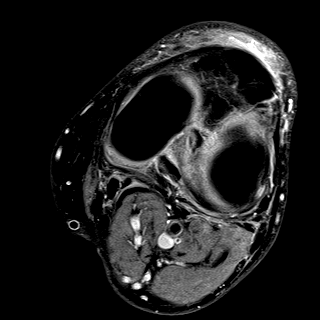
[im 21/37]
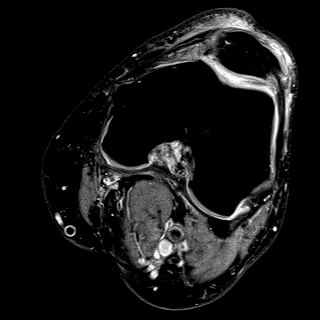
[im 26/37]
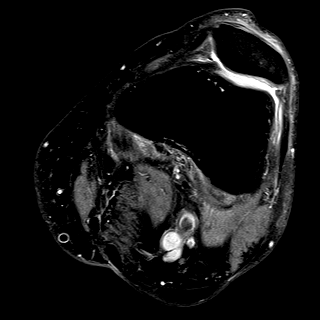
[im 31/37]
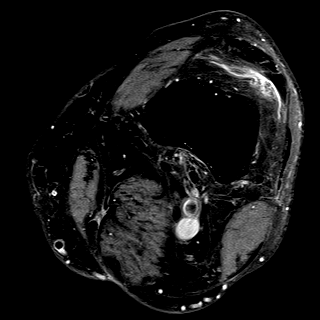
[im 37/37]
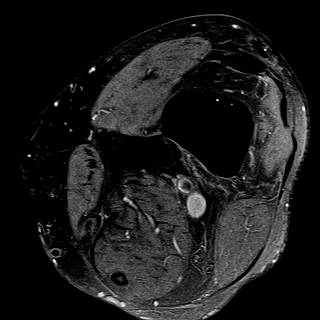

[Series 4: T1 · coronal · 3.0mm · 0.50mm/px · 2 of 31 slices shown]
[im 1/31]
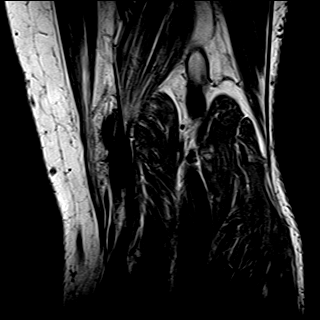
[im 6/31]
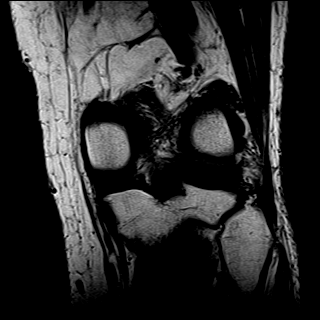

[Series 5: PD fat-sat · sagittal · 3.0mm · 0.50mm/px · 8 of 33 slices shown (2 of 4)]
[im 1/33]
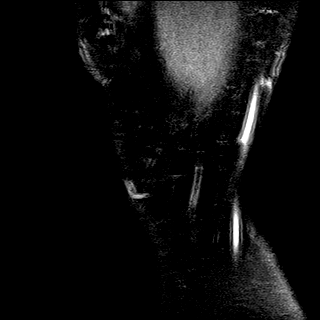
[im 5/33]
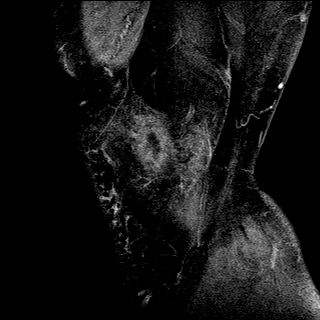
[im 10/33]
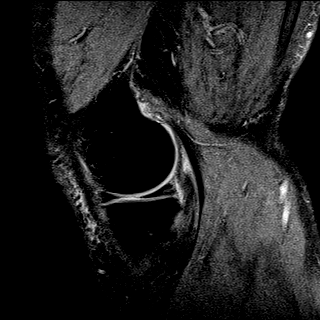
[im 14/33]
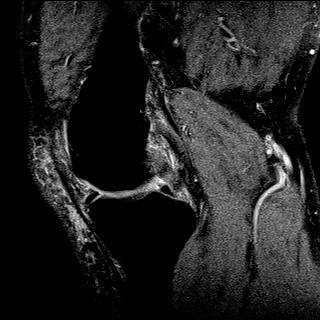
[im 19/33]
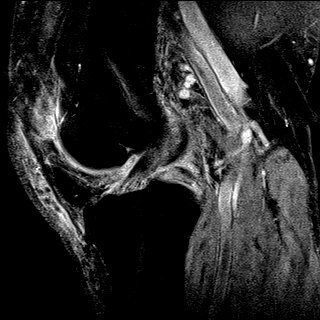
[im 23/33]
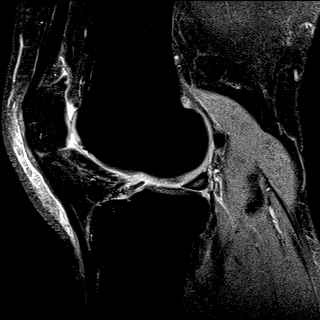
[im 28/33]
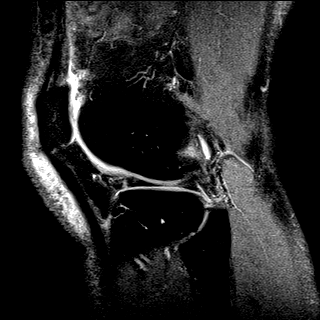
[im 33/33]
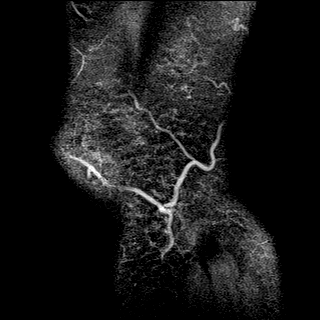

[Series 6: T2 fat-sat · coronal · 3.0mm · 0.31mm/px · 7 of 31 slices shown]
[im 1/31]
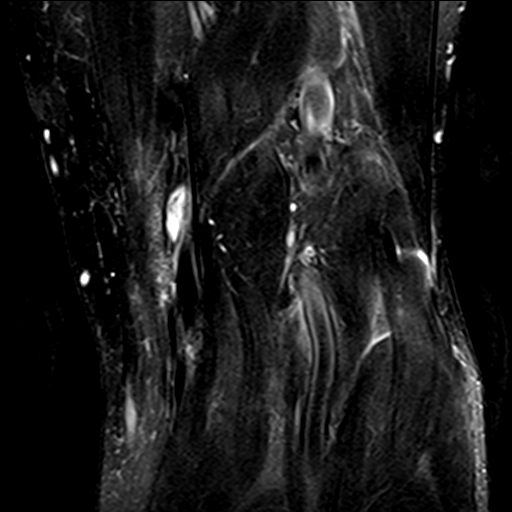
[im 6/31]
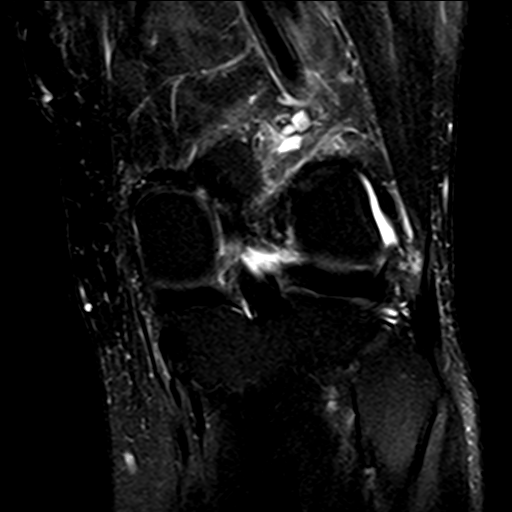
[im 11/31]
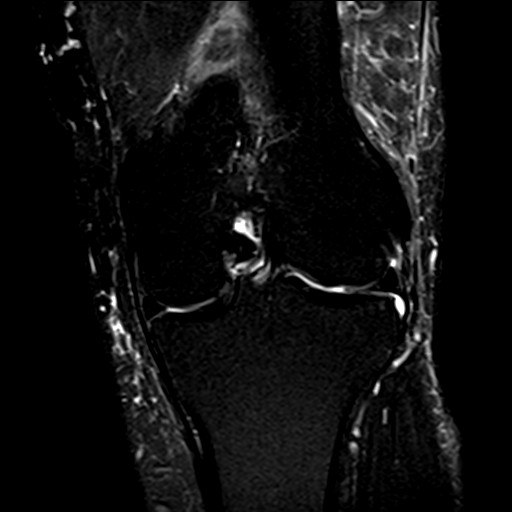
[im 16/31]
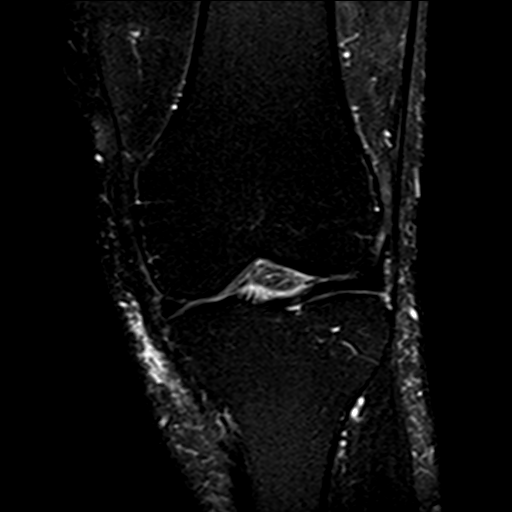
[im 21/31]
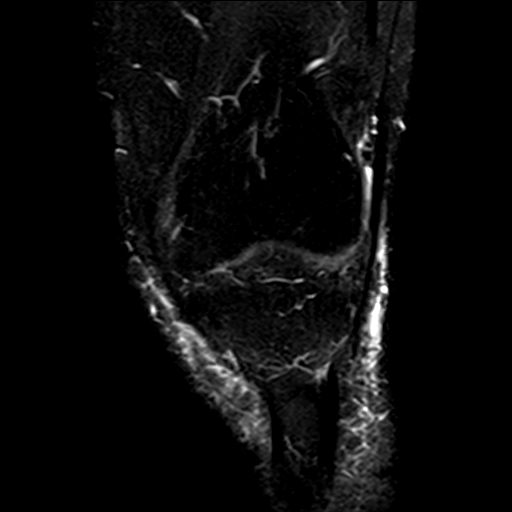
[im 26/31]
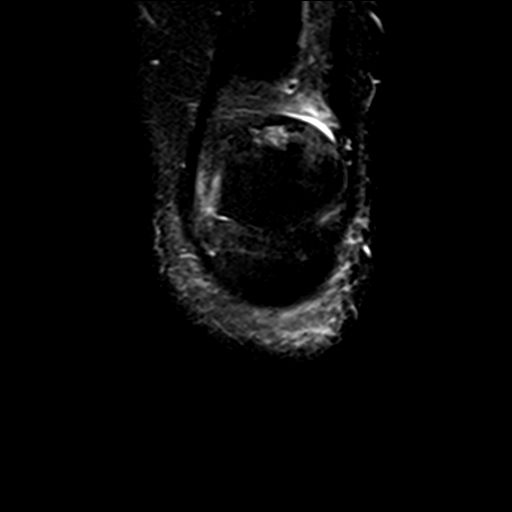
[im 31/31]
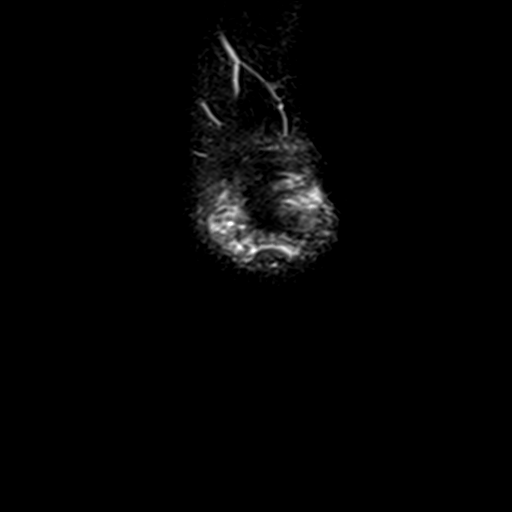

[Series 7: PD fat-sat · coronal · 3.0mm · 0.50mm/px · 7 of 31 slices shown (3 of 4)]
[im 1/31]
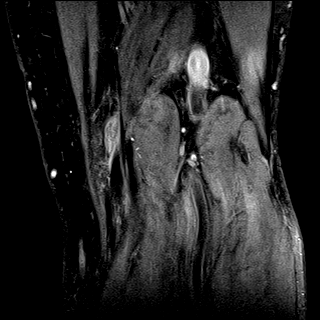
[im 6/31]
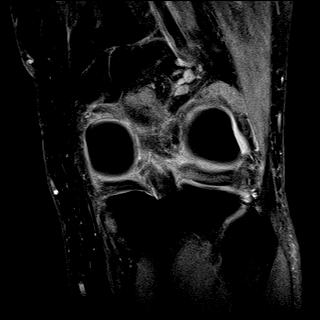
[im 11/31]
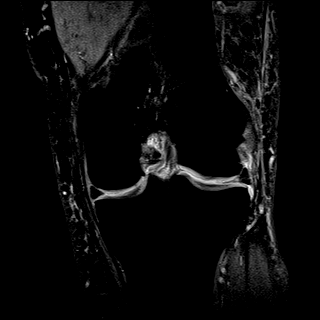
[im 16/31]
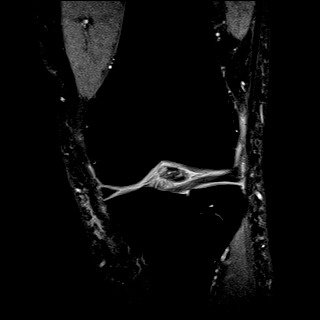
[im 21/31]
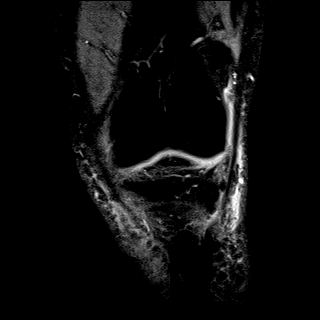
[im 26/31]
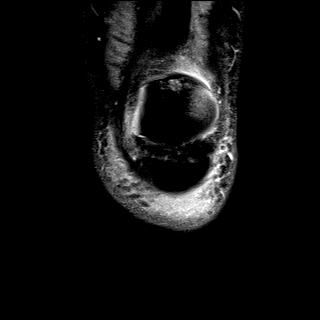
[im 31/31]
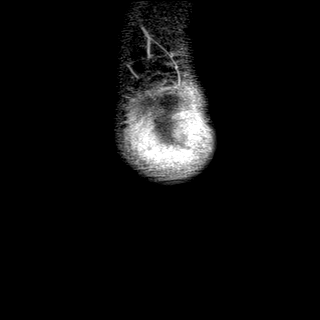

[Series 8: PD fat-sat · oblique · 2.0mm · 0.62mm/px · 3 of 13 slices shown (4 of 4)]
[im 1/13]
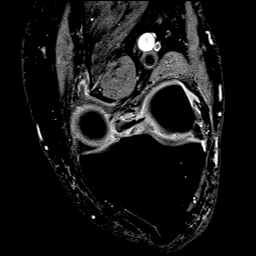
[im 7/13]
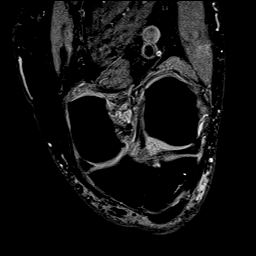
[im 13/13]
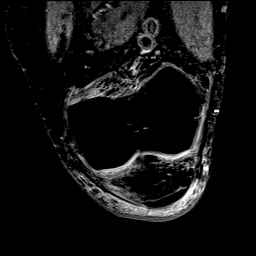

[35 of 40 positions shown; findings below may reference images not displayed]

FINDINGS: Despite efforts by the technologist and patient, motion artifact is
present on today's exam and could not be eliminated. This reduces
exam sensitivity and specificity.

MENISCI

Medial meniscus:  Mild grade 1 signal in the posterior horn.

Lateral meniscus: Small free edge tear of the midbody, image [DATE].
There is degenerative grade 1 signal in the posterior horn which is
most striking at the posterior meniscal root, and some low-grade
degenerative tearing of the posterior meniscal root is difficult to
exclude given The extent of the signal abnormality.

LIGAMENTS

Cruciates:  Unremarkable

Collaterals: Thickened but low T2 signal appearance of the proximal
fibular collateral ligament.

CARTILAGE

Patellofemoral: Generally moderate chondral thinning but with severe
full-thickness articular cartilage loss superiorly along the lateral
patellar facet and laterally in the medial patellar facet.
Degenerative subcortical foci of edema along the upper patella.

Medial:  Mild degenerative chondral thinning.

Lateral: Mild degenerative chondral thinning with focal moderate
chondral thinning centrally along the lateral femoral condyle. No
significant subcortical lesion.

Joint:  Trace knee effusion.

Popliteal Fossa:  Small Baker's cyst.

Extensor Mechanism:  Prepatellar subcutaneous edema.

Bones: Mild spurring along the intercondylar notch and tibial spine.
IMPRESSION: 1. Considerable degenerative signal throughout the root of the
posterior horn lateral meniscus, making it difficult to exclude a
degenerative tear in this vicinity. There is also a small free edge
tear of the midbody lateral meniscus.
2. The fibular collateral ligament is somewhat thickened but low in
T2 signal. The thickening may be the result of a remote or chronic
injury.
3. Mild to moderate degrees of degenerative chondral thinning in the
knee as noted above. There are some focal full-thickness areas of
articular cartilage loss along the patella superiorly and in the
lateral portion of the medial femoral condyle.
4. Trace knee effusion with small Baker's cyst.
5. Prepatellar subcutaneous edema.

## 2016-11-08 DIAGNOSIS — M1712 Unilateral primary osteoarthritis, left knee: Secondary | ICD-10-CM | POA: Diagnosis not present

## 2016-12-22 ENCOUNTER — Other Ambulatory Visit: Payer: Self-pay | Admitting: Family Medicine

## 2016-12-22 ENCOUNTER — Other Ambulatory Visit: Payer: Self-pay

## 2016-12-22 MED ORDER — GLIPIZIDE ER 5 MG PO TB24: 5.0000 mg | ORAL_TABLET | Freq: Every day | ORAL | 1 refills | Status: DC

## 2016-12-22 MED ORDER — METFORMIN HCL 500 MG PO TABS
ORAL_TABLET | ORAL | 1 refills | Status: DC
Start: 2016-12-22 — End: 2017-09-03

## 2016-12-22 MED ORDER — LEVOTHYROXINE SODIUM 50 MCG PO TABS: 50.0000 ug | ORAL_TABLET | Freq: Every day | ORAL | 1 refills | Status: DC

## 2016-12-22 MED ORDER — MELOXICAM 15 MG PO TABS: 15.0000 mg | ORAL_TABLET | Freq: Every day | ORAL | 0 refills | Status: DC | PRN

## 2016-12-22 MED ORDER — LISINOPRIL-HYDROCHLOROTHIAZIDE 10-12.5 MG PO TABS: 1.0000 | ORAL_TABLET | Freq: Every day | ORAL | 1 refills | Status: DC

## 2016-12-22 MED ORDER — CITALOPRAM HYDROBROMIDE 20 MG PO TABS: 20.0000 mg | ORAL_TABLET | Freq: Every day | ORAL | 1 refills | Status: DC

## 2016-12-22 MED ORDER — ATORVASTATIN CALCIUM 20 MG PO TABS: 20.0000 mg | ORAL_TABLET | Freq: Every day | ORAL | 1 refills | Status: DC

## 2016-12-22 NOTE — Telephone Encounter (Signed)
Medication has been refilled.

## 2016-12-22 NOTE — Telephone Encounter (Signed)
Pt called requesting a refill on levothyroxine (SYNTHROID, LEVOTHROID) 50 MCG tablet, atorvastatin (LIPITOR) 20 MG tablet, citalopram (CELEXA) 20 MG tablet, glipiZIDE (GLUCOTROL XL) 5 MG 24 hr tablet, lisinopril-hydrochlorothiazide (PRINZIDE,ZESTORETIC) 10-12.5 MG tablet, meloxicam (MOBIC) 15 MG tablet, and metFORMIN (GLUCOPHAGE) 500 MG tablet. He would like a 90 day supply, or 30 day with 2 refills. Please advise, thank you!  [facility] [place]  Call pt @ [phone]

## 2016-12-22 NOTE — Telephone Encounter (Signed)
Sent to pharmacy 

## 2016-12-22 NOTE — Telephone Encounter (Signed)
He completely out of levothyroxine (SYNTHROID, LEVOTHROID) 50 MCG tablet.

## 2016-12-22 NOTE — Telephone Encounter (Signed)
Last OV 09/28/16 last filled levothyroxine today. Met formin 09/01/16 180 1rf meloxicam 10/17/16 30 3rf lisiopril-hctz 07/03/16 by Dr.Walker Citalopram 12/29/15 90 1rf by Dr.Walker Atorvastatin 03/14/16 90 3rf by dr.Walker   

## 2016-12-29 ENCOUNTER — Ambulatory Visit (INDEPENDENT_AMBULATORY_CARE_PROVIDER_SITE_OTHER): Payer: 59 | Admitting: Family Medicine

## 2016-12-29 ENCOUNTER — Encounter: Payer: Self-pay | Admitting: Family Medicine

## 2016-12-29 VITALS — BP 128/82 | HR 64 | Temp 97.6°F | Wt 246.4 lb

## 2016-12-29 DIAGNOSIS — E785 Hyperlipidemia, unspecified: Secondary | ICD-10-CM

## 2016-12-29 DIAGNOSIS — E1169 Type 2 diabetes mellitus with other specified complication: Secondary | ICD-10-CM

## 2016-12-29 DIAGNOSIS — M25562 Pain in left knee: Secondary | ICD-10-CM

## 2016-12-29 DIAGNOSIS — E669 Obesity, unspecified: Secondary | ICD-10-CM

## 2016-12-29 DIAGNOSIS — I10 Essential (primary) hypertension: Secondary | ICD-10-CM

## 2016-12-29 DIAGNOSIS — E119 Type 2 diabetes mellitus without complications: Secondary | ICD-10-CM | POA: Diagnosis not present

## 2016-12-29 DIAGNOSIS — M436 Torticollis: Secondary | ICD-10-CM | POA: Insufficient documentation

## 2016-12-29 LAB — POCT GLYCOSYLATED HEMOGLOBIN (HGB A1C): Hemoglobin A1C: 5.8

## 2016-12-29 NOTE — Progress Notes (Signed)
Pre visit review using our clinic review tool, if applicable. No additional management support is needed unless otherwise documented below in the visit note. 

## 2016-12-29 NOTE — Assessment & Plan Note (Addendum)
Patient's A1c is 5.8. Quite a bit improved. Given his occasional lows with glipizide we will discontinue glipizide. He will continue metformin.

## 2016-12-29 NOTE — Assessment & Plan Note (Signed)
At goal on recheck. Continue current medications. 

## 2016-12-29 NOTE — Assessment & Plan Note (Signed)
He will continue to follow with orthopedics for this. Likely related to meniscal issue.

## 2016-12-29 NOTE — Assessment & Plan Note (Signed)
-  Continue Lipitor °

## 2016-12-29 NOTE — Patient Instructions (Signed)
Nice to see you. Please continue current medications. We'll check an A1c today. We will call you with the results. You should always eat when taking the glipizide. You can do stretches for your neck as we discussed and can use heat on this area.

## 2016-12-29 NOTE — Assessment & Plan Note (Signed)
Suspect muscular stiffness related to poor posture and looking at the computer for 3-4 hours at a time. Discussed maintaining better posture and stretches for his neck. Also can use heat. He'll continue to monitor.

## 2016-12-29 NOTE — Progress Notes (Signed)
  Marikay AlarEric Trajan Grove, MD Phone: [phone]  Nelda MarseilleCharles Roger Andrey CampanileWilson is a 71 y.o. male who presents today for follow-up.  Hypertension: Checking at home occasionally. Typically runs 120s-1:30/80. Takes lisinopril, HCTZ though has not taken his medicine today. No chest pain, shortness breath, or edema.  Diabetes: Ranges from 58-140. Takes his metformin and glipizide. No polyuria or polydipsia. Has not seen ophthalmology in greater than a year. Does note some hypoglycemic episodes if he does not eat. The lowest it has been is 58. When it is low he gets the shakes. If he eats something this goes away.  Hyperlipidemia: Taking Lipitor. No claudication, right upper quadrant pain, or myalgias.  Patient does note some mild stiffness in his neck after playing his flight simulator for 3-4 hours. Notes no pain in his neck. No radiation of anything in his arms. No numbness or weakness.  Left knee meniscal issue: Getting injections through orthopedics for this. Occasionally feels like it gives out on him. His injections take the pain away for about 3-4 months then he goes back for another injection.  PMH: nonsmoker.   ROS see history of present illness  Objective  Physical Exam Vitals:   12/29/16 1001 12/29/16 1020  BP: (!) 158/88 128/82  Pulse: 64   Temp: 97.6 F (36.4 C)     BP Readings from Last 3 Encounters:  12/29/16 128/82  09/28/16 132/86  07/03/16 (!) 144/68   Wt Readings from Last 3 Encounters:  12/29/16 246 lb 6.4 oz (111.8 kg)  09/28/16 239 lb 6.4 oz (108.6 kg)  07/03/16 240 lb (108.9 kg)    Physical Exam  Constitutional: No distress.  Cardiovascular: Normal rate, regular rhythm and normal heart sounds.   Pulmonary/Chest: Effort normal and breath sounds normal.  Musculoskeletal: He exhibits no edema.  No midline neck tenderness, no muscular neck tenderness, no midline neck step off, bilateral knees with no effusion, warmth, erythema, or ligamentous laxity, negative McMurray's  bilaterally  Neurological: He is alert.  5/5 strength in bilateral biceps, triceps, grip, quads, hamstrings, plantar and dorsiflexion, sensation to light touch intact in bilateral UE and LE, normal gait, 2+ patellar reflexes  Skin: Skin is warm and dry. He is not diaphoretic.     Assessment/Plan: Please see individual problem list.  Diabetes mellitus type 2 in obese Patient's A1c is 5.8. Quite a bit improved. Given his occasional lows with glipizide we will discontinue glipizide. He will continue metformin.   Hyperlipidemia Continue Lipitor.  Left lateral knee pain He will continue to follow with orthopedics for this. Likely related to meniscal issue.  Neck stiffness Suspect muscular stiffness related to poor posture and looking at the computer for 3-4 hours at a time. Discussed maintaining better posture and stretches for his neck. Also can use heat. He'll continue to monitor.  Hypertension At goal on recheck. Continue current medications.   Orders Placed This Encounter  Procedures  . POCT HgB A1C    Marikay AlarEric Mattelyn Imhoff, MD [facility]

## 2017-02-08 ENCOUNTER — Other Ambulatory Visit: Payer: Self-pay | Admitting: Internal Medicine

## 2017-02-08 ENCOUNTER — Telehealth: Payer: Self-pay | Admitting: *Deleted

## 2017-02-08 NOTE — Telephone Encounter (Signed)
Requested medication refill for : clobetasol ointment  Pharmacy:[facility]  Please Contact Pt when ready or sent to Pharmacy:  [phone]

## 2017-02-09 MED ORDER — CLOBETASOL PROPIONATE 0.05 % EX OINT: TOPICAL_OINTMENT | Freq: Two times a day (BID) | CUTANEOUS | 0 refills | Status: DC

## 2017-02-09 NOTE — Telephone Encounter (Signed)
Refilled: 10/03/13 by Dr.Walker Last OV: 12/29/16 Last Labs: 09/28/16 Future OV: 03/30/17 Please advise?

## 2017-02-09 NOTE — Telephone Encounter (Signed)
Is for psoriasis. Sent to pharmacy.

## 2017-02-09 NOTE — Telephone Encounter (Signed)
Last OV 12/29/16 med is reported as discontinued but patient called and requested refill sent to [facility]

## 2017-02-09 NOTE — Telephone Encounter (Signed)
Sent to pharmacy 

## 2017-03-30 ENCOUNTER — Ambulatory Visit (INDEPENDENT_AMBULATORY_CARE_PROVIDER_SITE_OTHER): Payer: 59 | Admitting: Family Medicine

## 2017-03-30 ENCOUNTER — Encounter: Payer: Self-pay | Admitting: Family Medicine

## 2017-03-30 VITALS — BP 150/82 | HR 59 | Temp 97.8°F | Wt 251.0 lb

## 2017-03-30 DIAGNOSIS — E119 Type 2 diabetes mellitus without complications: Secondary | ICD-10-CM

## 2017-03-30 DIAGNOSIS — E669 Obesity, unspecified: Secondary | ICD-10-CM | POA: Diagnosis not present

## 2017-03-30 DIAGNOSIS — E1169 Type 2 diabetes mellitus with other specified complication: Secondary | ICD-10-CM

## 2017-03-30 DIAGNOSIS — E039 Hypothyroidism, unspecified: Secondary | ICD-10-CM | POA: Diagnosis not present

## 2017-03-30 DIAGNOSIS — E785 Hyperlipidemia, unspecified: Secondary | ICD-10-CM

## 2017-03-30 DIAGNOSIS — M436 Torticollis: Secondary | ICD-10-CM | POA: Diagnosis not present

## 2017-03-30 DIAGNOSIS — I10 Essential (primary) hypertension: Secondary | ICD-10-CM

## 2017-03-30 LAB — COMPREHENSIVE METABOLIC PANEL
ALBUMIN: 4.2 g/dL (ref 3.5–5.2)
ALK PHOS: 61 U/L (ref 39–117)
ALT: 29 U/L (ref 0–53)
AST: 27 U/L (ref 0–37)
BILIRUBIN TOTAL: 0.6 mg/dL (ref 0.2–1.2)
BUN: 14 mg/dL (ref 6–23)
CALCIUM: 9.8 mg/dL (ref 8.4–10.5)
CHLORIDE: 103 meq/L (ref 96–112)
CO2: 28 mEq/L (ref 19–32)
CREATININE: 0.76 mg/dL (ref 0.40–1.50)
GFR: 106.98 mL/min (ref >60.00)
Glucose, Bld: 157 mg/dL — ABNORMAL HIGH (ref 70–99)
Potassium: 4.8 mEq/L (ref 3.5–5.1)
Sodium: 139 mEq/L (ref 135–145)
Total Protein: 6.8 g/dL (ref 6.0–8.3)

## 2017-03-30 LAB — TSH: TSH: 1.85 u[IU]/mL (ref 0.35–4.50)

## 2017-03-30 LAB — LDL CHOLESTEROL, DIRECT: Direct LDL: 93 mg/dL

## 2017-03-30 LAB — HEMOGLOBIN A1C: HEMOGLOBIN A1C: 6.9 % — AB (ref 4.6–6.5)

## 2017-03-30 NOTE — Patient Instructions (Signed)
Nice to see you. You need to work on diet and exercise as we discussed. We will check lab work in contact with the results.  You can do the following exercises for your neck. Please see the eye doctor.    Cervical Strain and Sprain Rehab Ask your health care provider which exercises are safe for you. Do exercises exactly as told by your health care provider and adjust them as directed. It is normal to feel mild stretching, pulling, tightness, or discomfort as you do these exercises, but you should stop right away if you feel sudden pain or your pain gets worse.Do not begin these exercises until told by your health care provider. Stretching and range of motion exercises These exercises warm up your muscles and joints and improve the movement and flexibility of your neck. These exercises also help to relieve pain, numbness, and tingling. Exercise A: Cervical side bend   1. Using good posture, sit on a stable chair or stand up. 2. Without moving your shoulders, slowly tilt your left / right ear to your shoulder until you feel a stretch in your neck muscles. You should be looking straight ahead. 3. Hold for __________ seconds. 4. Repeat with the other side of your neck. Repeat __________ times. Complete this exercise __________ times a day. Exercise B: Cervical rotation   1. Using good posture, sit on a stable chair or stand up. 2. Slowly turn your head to the side as if you are looking over your left / right shoulder.  Keep your eyes level with the ground.  Stop when you feel a stretch along the side and the back of your neck. 3. Hold for __________ seconds. 4. Repeat this by turning to your other side. Repeat __________ times. Complete this exercise __________ times a day. Exercise C: Thoracic extension and pectoral stretch  1. Roll a towel or a small blanket so it is about 4 inches (10 cm) in diameter. 2. Lie down on your back on a firm surface. 3. Put the towel lengthwise, under  your spine in the middle of your back. It should not be not under your shoulder blades. The towel should line up with your spine from your middle back to your lower back. 4. Put your hands behind your head and let your elbows fall out to your sides. 5. Hold for __________ seconds. Repeat __________ times. Complete this exercise __________ times a day. Strengthening exercises These exercises build strength and endurance in your neck. Endurance is the ability to use your muscles for a long time, even after your muscles get tired. Exercise D: Upper cervical flexion, isometric  1. Lie on your back with a thin pillow behind your head and a small rolled-up towel under your neck. 2. Gently tuck your chin toward your chest and nod your head down to look toward your feet. Do not lift your head off the pillow. 3. Hold for __________ seconds. 4. Release the tension slowly. Relax your neck muscles completely before you repeat this exercise. Repeat __________ times. Complete this exercise __________ times a day. Exercise E: Cervical extension, isometric   1. Stand about 6 inches (15 cm) away from a wall, with your back facing the wall. 2. Place a soft object, about 6-8 inches (15-20 cm) in diameter, between the back of your head and the wall. A soft object could be a small pillow, a ball, or a folded towel. 3. Gently tilt your head back and press into the soft object. Keep your  jaw and forehead relaxed. 4. Hold for __________ seconds. 5. Release the tension slowly. Relax your neck muscles completely before you repeat this exercise. Repeat __________ times. Complete this exercise __________ times a day. Posture and body mechanics   Body mechanics refers to the movements and positions of your body while you do your daily activities. Posture is part of body mechanics. Good posture and healthy body mechanics can help to relieve stress in your body's tissues and joints. Good posture means that your spine is in  its natural S-curve position (your spine is neutral), your shoulders are pulled back slightly, and your head is not tipped forward. The following are general guidelines for applying improved posture and body mechanics to your everyday activities. Standing   When standing, keep your spine neutral and keep your feet about hip-width apart. Keep a slight bend in your knees. Your ears, shoulders, and hips should line up.  When you do a task in which you stand in one place for a long time, place one foot up on a stable object that is 2-4 inches (5-10 cm) high, such as a footstool. This helps keep your spine neutral. Sitting    When sitting, keep your spine neutral and your keep feet flat on the floor. Use a footrest, if necessary, and keep your thighs parallel to the floor. Avoid rounding your shoulders, and avoid tilting your head forward.  When working at a desk or a computer, keep your desk at a height where your hands are slightly lower than your elbows. Slide your chair under your desk so you are close enough to maintain good posture.  When working at a computer, place your monitor at a height where you are looking straight ahead and you do not have to tilt your head forward or downward to look at the screen. Resting  When lying down and resting, avoid positions that are most painful for you. Try to support your neck in a neutral position. You can use a contour pillow or a small rolled-up towel. Your pillow should support your neck but not push on it. This information is not intended to replace advice given to you by your health care provider. Make sure you discuss any questions you have with your health care provider. Document Released: 11/20/2005 Document Revised: 07/27/2016 Document Reviewed: 10/27/2015 Elsevier Interactive Patient Education  2017 [employer].

## 2017-03-30 NOTE — Assessment & Plan Note (Signed)
Has been slightly worse. Suspect related to dietary changes. He will start work on diet and exercise. We'll check an A1c today. Could consider adding back glipizide or adding in something like Jardiance if A1c is uncontrolled.

## 2017-03-30 NOTE — Assessment & Plan Note (Signed)
Check direct LDL. Continue Lipitor. 

## 2017-03-30 NOTE — Assessment & Plan Note (Signed)
None at goal today. Had been previously well controlled. Suspect some of this is related to his dietary indiscretions and his weight gain. Discussed starting another medicine versus having him work on diet and exercise and altering his diet. He opted to work on diet and try to lose some weight. He'll return in 2 weeks for repeat blood pressure check. He'll start checking at home as well. He'll continue his current medications.

## 2017-03-30 NOTE — Assessment & Plan Note (Signed)
Continues to have issues with the crick in his neck. He is provided with exercises to complete for this. He has a benign exam. We'll see how he does with this and could consider imaging if continues to bother him.

## 2017-03-30 NOTE — Progress Notes (Signed)
Pre visit review using our clinic review tool, if applicable. No additional management support is needed unless otherwise documented below in the visit note. 

## 2017-03-30 NOTE — Progress Notes (Signed)
  Timothy Rumps, MD Phone: [phone]  Timothy Edwards is a 71 y.o. male who presents today for follow-up.  HYPERTENSION Disease Monitoring: Blood pressure range-126/80 though is rarely checking Chest pain- no      Dyspnea- no Medications: Compliance- taking lisinopril, HCTZ   Edema- no  DIABETES Disease Monitoring: Blood Sugar ranges-108-170, reports they've been running higher as he has not been eating well. Eating a fair amount mac & cheese and [employer]. Polyuria/phagia/dipsia- no      Visual problems- no, is due for ophthalmology visit Medications: Compliance- taking metformin Hypoglycemic symptoms- no  HYPERLIPIDEMIA Disease Monitoring: See symptoms for Hypertension Medications: Compliance- taking Lipitor Right upper quadrant pain- no  Muscle aches- no  Neck pain: notes a slight crick in his neck. Notes it clicks when he rotates it at times. He notes no radiation of the discomfort. It only bothers him in the right aspect of his neck. No numbness or weakness. He has been sitting at a computer fairly consistently playing a flight simulator.  PMH: nonsmoker.   ROS see history of present illness  Objective  Physical Exam Vitals:   03/30/17 1004  BP: (!) 150/82  Pulse: (!) 59  Temp: 97.8 F (36.6 C)    BP Readings from Last 3 Encounters:  03/30/17 (!) 150/82  12/29/16 128/82  09/28/16 132/86   Wt Readings from Last 3 Encounters:  03/30/17 251 lb (113.9 kg)  12/29/16 246 lb 6.4 oz (111.8 kg)  09/28/16 239 lb 6.4 oz (108.6 kg)    Physical Exam  Constitutional: No distress.  HENT:  Head: Normocephalic and atraumatic.  Cardiovascular: Normal rate, regular rhythm and normal heart sounds.   Pulmonary/Chest: Effort normal and breath sounds normal.  Musculoskeletal: He exhibits no edema.  No midline neck tenderness, no midline neck step-off, no muscular neck tenderness  Neurological: He is alert. Gait normal.  5/5 strength in bilateral biceps, triceps,  grip, quads, hamstrings, plantar and dorsiflexion, sensation to light touch intact in bilateral UE and LE  Skin: Skin is warm and dry. He is not diaphoretic.     Assessment/Plan: Please see individual problem list.  Hypertension None at goal today. Had been previously well controlled. Suspect some of this is related to his dietary indiscretions and his weight gain. Discussed starting another medicine versus having him work on diet and exercise and altering his diet. He opted to work on diet and try to lose some weight. He'll return in 2 weeks for repeat blood pressure check. He'll start checking at home as well. He'll continue his current medications.  Diabetes mellitus type 2 in obese Has been slightly worse. Suspect related to dietary changes. He will start work on diet and exercise. We'll check an A1c today. Could consider adding back glipizide or adding in something like Jardiance if A1c is uncontrolled.  Hyperlipidemia Check direct LDL. Continue Lipitor.  Neck stiffness Continues to have issues with the crick in his neck. He is provided with exercises to complete for this. He has a benign exam. We'll see how he does with this and could consider imaging if continues to bother him.   Orders Placed This Encounter  Procedures  . Comp Met (CMET)  . TSH  . HgB A1c  . Direct LDL    Timothy Rumps, MD [place]

## 2017-04-12 ENCOUNTER — Ambulatory Visit (INDEPENDENT_AMBULATORY_CARE_PROVIDER_SITE_OTHER): Payer: 59 | Admitting: *Deleted

## 2017-04-12 ENCOUNTER — Encounter: Payer: Self-pay | Admitting: *Deleted

## 2017-04-12 VITALS — BP 148/78 | HR 72 | Resp 14

## 2017-04-12 DIAGNOSIS — I10 Essential (primary) hypertension: Secondary | ICD-10-CM | POA: Diagnosis not present

## 2017-04-12 NOTE — Progress Notes (Signed)
Recommend medication. Needs to follow up with [facility].  Everlene OtherJayce Kilian Schwartz DO [facility] [place]

## 2017-04-12 NOTE — Progress Notes (Signed)
Patient presented for 2 week follow up on BP, patient trying to lose weight reduced sodium diet to decrease BP.  Left arm 150/84 pulse 70 , right arm 148/78 pulse 72. BP attained according to nursing standards.

## 2017-04-13 NOTE — Progress Notes (Signed)
Patient notified and voiced understanding, will follow recommendations when PCP is back in office.

## 2017-05-28 ENCOUNTER — Other Ambulatory Visit: Payer: Self-pay | Admitting: Family Medicine

## 2017-06-25 ENCOUNTER — Other Ambulatory Visit: Payer: Self-pay | Admitting: Family Medicine

## 2017-09-03 ENCOUNTER — Other Ambulatory Visit: Payer: Self-pay | Admitting: Family Medicine

## 2017-09-11 ENCOUNTER — Telehealth: Payer: Self-pay | Admitting: *Deleted

## 2017-09-11 ENCOUNTER — Encounter: Payer: Self-pay | Admitting: Family Medicine

## 2017-09-11 ENCOUNTER — Ambulatory Visit (INDEPENDENT_AMBULATORY_CARE_PROVIDER_SITE_OTHER): Payer: 59 | Admitting: Family Medicine

## 2017-09-11 VITALS — BP 148/82 | HR 66 | Temp 98.2°F | Wt 241.2 lb

## 2017-09-11 DIAGNOSIS — E1169 Type 2 diabetes mellitus with other specified complication: Secondary | ICD-10-CM

## 2017-09-11 DIAGNOSIS — I10 Essential (primary) hypertension: Secondary | ICD-10-CM | POA: Diagnosis not present

## 2017-09-11 DIAGNOSIS — E669 Obesity, unspecified: Secondary | ICD-10-CM | POA: Diagnosis not present

## 2017-09-11 DIAGNOSIS — E039 Hypothyroidism, unspecified: Secondary | ICD-10-CM | POA: Diagnosis not present

## 2017-09-11 DIAGNOSIS — Z23 Encounter for immunization: Secondary | ICD-10-CM

## 2017-09-11 DIAGNOSIS — E785 Hyperlipidemia, unspecified: Secondary | ICD-10-CM

## 2017-09-11 DIAGNOSIS — F330 Major depressive disorder, recurrent, mild: Secondary | ICD-10-CM

## 2017-09-11 LAB — LIPID PANEL
CHOL/HDL RATIO: 3
Cholesterol: 137 mg/dL (ref 0–200)
HDL: 46.4 mg/dL (ref >39.00)
LDL CALC: 71 mg/dL (ref 0–99)
NONHDL: 90.9
TRIGLYCERIDES: 98 mg/dL (ref 0.0–149.0)
VLDL: 19.6 mg/dL (ref 0.0–40.0)

## 2017-09-11 LAB — COMPREHENSIVE METABOLIC PANEL
ALBUMIN: 4.5 g/dL (ref 3.5–5.2)
ALT: 73 U/L — ABNORMAL HIGH (ref 0–53)
AST: 64 U/L — AB (ref 0–37)
Alkaline Phosphatase: 74 U/L (ref 39–117)
BUN: 19 mg/dL (ref 6–23)
CHLORIDE: 94 meq/L — AB (ref 96–112)
CO2: 28 mEq/L (ref 19–32)
CREATININE: 0.8 mg/dL (ref 0.40–1.50)
Calcium: 10 mg/dL (ref 8.4–10.5)
GFR: 100.7 mL/min (ref >60.00)
GLUCOSE: 273 mg/dL — AB (ref 70–99)
POTASSIUM: 4.3 meq/L (ref 3.5–5.1)
SODIUM: 131 meq/L — AB (ref 135–145)
TOTAL PROTEIN: 7.4 g/dL (ref 6.0–8.3)
Total Bilirubin: 0.9 mg/dL (ref 0.2–1.2)

## 2017-09-11 LAB — HEMOGLOBIN A1C: Hgb A1c MFr Bld: 10.6 % — ABNORMAL HIGH (ref 4.6–6.5)

## 2017-09-11 LAB — TSH: TSH: 2.39 u[IU]/mL (ref 0.35–4.50)

## 2017-09-11 NOTE — Telephone Encounter (Signed)
Noted. That is the maximum dose for his age. We could add wellbutrin to augment the celexa to help with his depression if he would like. We'll also have him see a therapist. Please see what he would like to do. Thanks.

## 2017-09-11 NOTE — Telephone Encounter (Signed)
Patient states he would like rx to be sent to [facility], he would like to think about the referral first.

## 2017-09-11 NOTE — Assessment & Plan Note (Signed)
Tolerating Lipitor. Check lipid panel.

## 2017-09-11 NOTE — Progress Notes (Signed)
  [name], MD Phone: [phone]  Timothy Edwards is a 71 y.o. male who presents today for f/u.  HYPERTENSION Disease Monitoring: Blood pressure range-140/77 Chest pain- no      Dyspnea- no Medications: Compliance- taking lisinopril/hctz   Edema- no  DIABETES Disease Monitoring: Blood Sugar ranges-100s Polyuria/phagia/dipsia- no      Visual problems- no, has not seen optho yet Medications: Compliance- taking metformin Hypoglycemic symptoms- no  HYPERLIPIDEMIA Disease Monitoring: See symptoms for Hypertension Medications: Compliance- taking lipitor Right upper quadrant pain- no  Muscle aches- no  Patient notes progressively for some time now he has had decreased drive and some listlessness. He just doesn't have the same interest in things as he used to. He feels like he is in a funk. He denies feeling depressed or anxious. He has Celexa on his medication list that he is unsure if he's taking it. No SI.  PMH: nonsmoker.   ROS see history of present illness  Objective  Physical Exam Vitals:   09/11/17 1010 09/11/17 1052  BP: (!) 144/92 (!) 148/82  Pulse: 66   Temp: 98.2 F (36.8 C)   SpO2: 97%     BP Readings from Last 3 Encounters:  09/11/17 (!) 148/82  04/12/17 (!) 148/78  03/30/17 (!) 150/82   Wt Readings from Last 3 Encounters:  09/11/17 241 lb 3.2 oz (109.4 kg)  03/30/17 251 lb (113.9 kg)  12/29/16 246 lb 6.4 oz (111.8 kg)    Physical Exam  Constitutional: No distress.  HENT:  Head: Normocephalic and atraumatic.  Cardiovascular: Normal rate, regular rhythm and normal heart sounds.   Pulmonary/Chest: Effort normal and breath sounds normal.  Musculoskeletal: He exhibits no edema.  Neurological: He is alert. Gait normal.  Skin: Skin is warm. He is not diaphoretic.     Assessment/Plan: Please see individual problem list.  Hypertension Adequately controlled at home for age. He'll continue to monitor and if consistently greater than 140/90  he will let us know.  Hypothyroidism Check TSH.  Diabetes mellitus type 2 in obese Well-controlled. Check A1c. Continue metformin.  Hyperlipidemia Tolerating Lipitor. Check lipid panel.  Depression Patient with depression. PHQ 9 score 11. He is unsure if he's taking Celexa. He'll contact us and let us know what dose he is on if he is taking it. If he is not taking it he will  let us know. We'll then determine what to do for him.   Orders Placed This Encounter  Procedures  . Flu vaccine HIGH DOSE PF  . Comp Met (CMET)  . TSH  . HgB A1c  . Lipid panel   [name], MD [place]

## 2017-09-11 NOTE — Assessment & Plan Note (Signed)
Well-controlled.  Check A1c.  Continue metformin. 

## 2017-09-11 NOTE — Telephone Encounter (Signed)
FYI : Pt wanted to confirm with Dr. Birdie Sons that he takes Citalopram 20 mg 1 tablet daily

## 2017-09-11 NOTE — Patient Instructions (Addendum)
Nice to see you. Please continue your current medications. We'll contact you with your lab work. Please call us to let us know what dose of Celexa you're taking or if you are not taking this. We can then give recommendations on what to do for your depression.

## 2017-09-11 NOTE — Telephone Encounter (Signed)
fyi

## 2017-09-11 NOTE — Assessment & Plan Note (Signed)
Patient with depression. PHQ 9 score 11. He is unsure if he's taking Celexa. He'll contact us and let us know what dose he is on if he is taking it. If he is not taking it he will  let us know. We'll then determine what to do for him.

## 2017-09-11 NOTE — Assessment & Plan Note (Signed)
Check TSH 

## 2017-09-11 NOTE — Assessment & Plan Note (Signed)
Adequately controlled at home for age. He'll continue to monitor and if consistently greater than 140/90 he will let us know.

## 2017-09-12 MED ORDER — BUPROPION HCL ER (XL) 150 MG PO TB24: 150.0000 mg | ORAL_TABLET | Freq: Every day | ORAL | 3 refills | Status: DC

## 2017-09-12 NOTE — Telephone Encounter (Signed)
Sent to pharmacy. Please get the patient set up for 8 week follow-up. Thanks.

## 2017-09-13 NOTE — Telephone Encounter (Signed)
Sooner would be preferable.

## 2017-09-13 NOTE — Telephone Encounter (Signed)
Patient is scheduled for follow up in January is that ok? Or sooner?

## 2017-09-13 NOTE — Telephone Encounter (Signed)
Tried calling, no vm 

## 2017-09-14 ENCOUNTER — Other Ambulatory Visit: Payer: Self-pay | Admitting: Family Medicine

## 2017-09-14 MED ORDER — GLIPIZIDE 5 MG PO TABS: 5.0000 mg | ORAL_TABLET | Freq: Every day | ORAL | 3 refills | Status: DC

## 2017-09-14 NOTE — Telephone Encounter (Signed)
Patient is scheduled for october

## 2017-09-17 ENCOUNTER — Other Ambulatory Visit: Payer: Self-pay | Admitting: Family Medicine

## 2017-10-03 ENCOUNTER — Encounter: Payer: Self-pay | Admitting: Family Medicine

## 2017-10-03 ENCOUNTER — Ambulatory Visit (INDEPENDENT_AMBULATORY_CARE_PROVIDER_SITE_OTHER): Payer: 59 | Admitting: Family Medicine

## 2017-10-03 DIAGNOSIS — E1169 Type 2 diabetes mellitus with other specified complication: Secondary | ICD-10-CM | POA: Diagnosis not present

## 2017-10-03 DIAGNOSIS — E669 Obesity, unspecified: Secondary | ICD-10-CM | POA: Diagnosis not present

## 2017-10-03 DIAGNOSIS — F330 Major depressive disorder, recurrent, mild: Secondary | ICD-10-CM | POA: Diagnosis not present

## 2017-10-03 NOTE — Assessment & Plan Note (Addendum)
Sugars have improved. I had a long discussion with him that he needs to make sure he eats a meal anytime he takes the glipizide. If he is going to skip breakfast he should take it with lunch. Advised if he starts to have more blood sugars in the 60s he needs to discontinue the glipizide. He verbalized understanding and he verbalized understanding of the reason that he needs to eat with his glipizide as his sugars may drop so low that he passes out. He'll continue to work on dietary changes and exercise as well.

## 2017-10-03 NOTE — Assessment & Plan Note (Signed)
Encouraged continued diet and exercise. 

## 2017-10-03 NOTE — Patient Instructions (Signed)
Nice to see you. You need to make sure you eat a meal when you take your glipizide. If you're not going to eat breakfast you should take it with lunch. Please continue to monitor your sugars. If you start to get more sugars less than 70 you need to discontinue the glipizide. Please continue to work on diet and exercise. Please continue Wellbutrin and Celexa. If you develop thoughts of harming your self or others please go to the emergency room.

## 2017-10-03 NOTE — Assessment & Plan Note (Signed)
This has improved. He is currently on Wellbutrin and Celexa. He feels quite a bit better. He does note he does not want to be ill in the future and would not want to put his family through that and has thought about ending things before he got to that point if he were to get sick. He has no plan or intent to harm himself currently. Discussed that if he were to start having those thoughts or develop a plan or intent he should be evaluated immediately.

## 2017-10-03 NOTE — Progress Notes (Signed)
  Timothy AlarEric Priyanka Causey, MD Phone: [phone]  Timothy Edwards is a 71 y.o. male who presents today for follow-up.  Blood sugars have been running mostly in the 80s to 110s. Rarely drops down into the 60s. That only occurs when he does not eat breakfast and then takes his glipizide. He will get shaky and jittery and sweaty when this occurs. He'll eat something and he will improve He is taking metformin. He's made quite a bit of dietary changes with eating oatmeal with blueberries. He's cut down on sugar and pie intake. Also decrease pizza intake.  Depression: Notes this is quite a bit better. He is more interested in doing things and has gotten out to do things. He takes Celexa and Wellbutrin. No SI or HI. He does note he thinks about his friends who've been sick later in life and things have gone downhill very quickly for them. This does depress him a little bit and he does not want his family to have to go through that. If that were ever to occur in the future he has had thoughts of ending things before it got to that point. He has no current plan or intent to harm himself.  PMH: nonsmoker.   ROS see history of present illness  Objective  Physical Exam Vitals:   10/03/17 1431  BP: 136/78  Pulse: 67  Temp: 98.4 F (36.9 C)  SpO2: 98%    BP Readings from Last 3 Encounters:  10/03/17 136/78  09/11/17 (!) 148/82  04/12/17 (!) 148/78   Wt Readings from Last 3 Encounters:  10/03/17 241 lb 9.6 oz (109.6 kg)  09/11/17 241 lb 3.2 oz (109.4 kg)  03/30/17 251 lb (113.9 kg)    Physical Exam  Constitutional: No distress.  Cardiovascular: Normal rate, regular rhythm and normal heart sounds.   Pulmonary/Chest: Effort normal and breath sounds normal.  Musculoskeletal: He exhibits no edema.  Neurological: He is alert. Gait normal.  Skin: Skin is warm and dry. He is not diaphoretic.  Psychiatric: Mood and affect normal.     Assessment/Plan: Please see individual problem  list.  Diabetes mellitus type 2 in obese Sugars have improved. I had a long discussion with him that he needs to make sure he eats a meal anytime he takes the glipizide. If he is going to skip breakfast he should take it with lunch. Advised if he starts to have more blood sugars in the 60s he needs to discontinue the glipizide. He verbalized understanding and he verbalized understanding of the reason that he needs to eat with his glipizide as his sugars may drop so low that he passes out. He'll continue to work on dietary changes and exercise as well.  Depression This has improved. He is currently on Wellbutrin and Celexa. He feels quite a bit better. He does note he does not want to be ill in the future and would not want to put his family through that and has thought about ending things before he got to that point if he were to get sick. He has no plan or intent to harm himself currently. Discussed that if he were to start having those thoughts or develop a plan or intent he should be evaluated immediately.  Obesity (BMI 30-39.9) Encouraged continued diet and exercise.   Timothy AlarEric Gabryela Kimbrell, MD [facility] [place]

## 2017-10-05 ENCOUNTER — Other Ambulatory Visit: Payer: 59

## 2017-10-15 ENCOUNTER — Other Ambulatory Visit: Payer: Self-pay | Admitting: Family Medicine

## 2017-11-14 ENCOUNTER — Emergency Department
Admission: EM | Admit: 2017-11-14 | Discharge: 2017-11-14 | Disposition: A | Discharge status: Home / Self Care | Payer: 59 | Attending: Student in an Organized Health Care Education/Training Program | Admitting: Student in an Organized Health Care Education/Training Program

## 2017-11-14 ENCOUNTER — Encounter: Payer: Self-pay | Admitting: Emergency Medicine

## 2017-11-14 ENCOUNTER — Encounter
Admission: EM | Disposition: A | Discharge status: Home / Self Care | Payer: Self-pay | Source: Home / Self Care | Attending: Student in an Organized Health Care Education/Training Program

## 2017-11-14 ENCOUNTER — Other Ambulatory Visit: Payer: Self-pay

## 2017-11-14 DIAGNOSIS — E119 Type 2 diabetes mellitus without complications: Secondary | ICD-10-CM | POA: Insufficient documentation

## 2017-11-14 DIAGNOSIS — E039 Hypothyroidism, unspecified: Secondary | ICD-10-CM | POA: Diagnosis not present

## 2017-11-14 DIAGNOSIS — X58XXXA Exposure to other specified factors, initial encounter: Secondary | ICD-10-CM | POA: Insufficient documentation

## 2017-11-14 DIAGNOSIS — Z7984 Long term (current) use of oral hypoglycemic drugs: Secondary | ICD-10-CM | POA: Insufficient documentation

## 2017-11-14 DIAGNOSIS — Y929 Unspecified place or not applicable: Secondary | ICD-10-CM | POA: Insufficient documentation

## 2017-11-14 DIAGNOSIS — Z79899 Other long term (current) drug therapy: Secondary | ICD-10-CM | POA: Diagnosis not present

## 2017-11-14 DIAGNOSIS — T18128A Food in esophagus causing other injury, initial encounter: Secondary | ICD-10-CM | POA: Diagnosis not present

## 2017-11-14 DIAGNOSIS — Y939 Activity, unspecified: Secondary | ICD-10-CM | POA: Diagnosis not present

## 2017-11-14 DIAGNOSIS — Y999 Unspecified external cause status: Secondary | ICD-10-CM | POA: Insufficient documentation

## 2017-11-14 DIAGNOSIS — T18108A Unspecified foreign body in esophagus causing other injury, initial encounter: Secondary | ICD-10-CM | POA: Diagnosis not present

## 2017-11-14 DIAGNOSIS — I10 Essential (primary) hypertension: Secondary | ICD-10-CM | POA: Insufficient documentation

## 2017-11-14 LAB — CBC WITH DIFFERENTIAL/PLATELET
BASOS PCT: 1 %
Basophils Absolute: 0.1 10*3/uL (ref 0–0.1)
EOS ABS: 0.2 10*3/uL (ref 0–0.7)
EOS PCT: 3 %
HCT: 42.3 % (ref 40.0–52.0)
HEMOGLOBIN: 14.5 g/dL (ref 13.0–18.0)
Lymphocytes Relative: 25 %
Lymphs Abs: 1.7 10*3/uL (ref 1.0–3.6)
MCH: 31.1 pg (ref 26.0–34.0)
MCHC: 34.3 g/dL (ref 32.0–36.0)
MCV: 90.8 fL (ref 80.0–100.0)
MONOS PCT: 9 %
Monocytes Absolute: 0.6 10*3/uL (ref 0.2–1.0)
NEUTROS PCT: 62 %
Neutro Abs: 4.4 10*3/uL (ref 1.4–6.5)
PLATELETS: 224 10*3/uL (ref 150–440)
RBC: 4.66 MIL/uL (ref 4.40–5.90)
RDW: 13.6 % (ref 11.5–14.5)
WBC: 7 10*3/uL (ref 3.8–10.6)

## 2017-11-14 LAB — BASIC METABOLIC PANEL
Anion gap: 12 (ref 5–15)
BUN: 12 mg/dL (ref 6–20)
CALCIUM: 9.4 mg/dL (ref 8.9–10.3)
CO2: 24 mmol/L (ref 22–32)
CREATININE: 0.74 mg/dL (ref 0.61–1.24)
Chloride: 102 mmol/L (ref 101–111)
Glucose, Bld: 111 mg/dL — ABNORMAL HIGH (ref 65–99)
Potassium: 4 mmol/L (ref 3.5–5.1)
SODIUM: 138 mmol/L (ref 135–145)

## 2017-11-14 SURGERY — EGD (ESOPHAGOGASTRODUODENOSCOPY)
Anesthesia: General

## 2017-11-14 MED ORDER — PANTOPRAZOLE SODIUM 40 MG PO TBEC: 40.0000 mg | DELAYED_RELEASE_TABLET | Freq: Two times a day (BID) | ORAL | 0 refills | Status: DC

## 2017-11-14 MED ORDER — ONDANSETRON HCL 4 MG/2ML IJ SOLN
4.0000 mg | Freq: Once | INTRAMUSCULAR | Status: AC
  Administered 2017-11-14: 4 mg via INTRAVENOUS
  Filled 2017-11-14: qty 2

## 2017-11-14 MED ORDER — GLUCAGON HCL RDNA (DIAGNOSTIC) 1 MG IJ SOLR
1.0000 mg | Freq: Once | INTRAMUSCULAR | Status: AC
  Administered 2017-11-14: 1 mg via INTRAVENOUS

## 2017-11-14 MED ORDER — GLUCAGON HCL RDNA (DIAGNOSTIC) 1 MG IJ SOLR
INTRAMUSCULAR | Status: AC
  Administered 2017-11-14: 1 mg via INTRAVENOUS
  Filled 2017-11-14: qty 1

## 2017-11-14 MED ORDER — LORAZEPAM 2 MG/ML IJ SOLN
0.5000 mg | Freq: Once | INTRAMUSCULAR | Status: DC
  Filled 2017-11-14: qty 1

## 2017-11-14 MED ORDER — FENTANYL CITRATE (PF) 100 MCG/2ML IJ SOLN: 50.0000 ug | INTRAMUSCULAR | Status: DC | PRN

## 2017-11-14 NOTE — Discharge Instructions (Signed)
Follow-up with gastroenterology.  Start taking Protonix.  For the next 24 hours keep a clear liquid diet that means clear liquids and nothing solid to eat.  You can drink smoothies or applesauce consistency.  Return for worsening pain or questions.

## 2017-11-14 NOTE — ED Notes (Signed)
Pt able to keep sips of water down.  Pt states that he does not feel the food bolus anymore.  Pt states that he does not want to do the EGD at this time.  Pt refusing to stay in hospital.

## 2017-11-14 NOTE — ED Triage Notes (Signed)
Pt was eating pot roast and now feels like meat is stuck. No bones, not able to swallow fluids but able to swallow some saliva.

## 2017-11-14 NOTE — ED Provider Notes (Signed)
[facility] Emergency Department Provider Note    First MD Initiated Contact with Patient 11/14/17 2147     (approximate)  I have reviewed the triage vital signs and the nursing notes.   HISTORY  Chief Complaint Foreign Body    HPI Charistopher Rumble is a 71 y.o. male history of acid reflux but no other chronic comorbidities presents with chief complaint of feeling that food got stuck in his throat tonight while he was eating pot roast.  States that he has trouble chewing of food because he has had a lot of teeth pulled recently.  States he was swelling of big piece of pot roast and felt that it got lodged in his throat.  This occurred roughly 2 hours prior to arrival.  He has tried drinking Coke to see if that would help past but now is only having burning sensation in his throat.  No previous endoscopy.  Not take any blood thinners.  No shortness of breath.  Unable to drink water and is having to cough up his saliva.  Past Medical History:  Diagnosis Date  . Depression   . Hyperlipidemia   . Hypertension   . Unspecified hypothyroidism   . Vitamin D deficiency    Family History  Problem Relation Age of Onset  . Obesity Father    No past surgical history on file. Patient Active Problem List   Diagnosis Date Noted  . Neck stiffness 12/29/2016  . Left lateral knee pain 05/11/2016  . Depression 04/27/2015  . Arthritis 01/26/2015  . Elevated LFTs 07/27/2014  . Memory loss 05/05/2013  . Diabetes mellitus type 2 in obese ([place]) 03/18/2013  . Vitamin D deficiency   . Hypertension 10/11/2011  . Hyperlipidemia 10/11/2011  . Hypothyroidism 10/11/2011  . Generalized anxiety disorder 10/11/2011  . Allergic rhinitis 10/11/2011  . Obesity (BMI 30-39.9) 10/11/2011      Prior to Admission medications   Medication Sig Start Date End Date Taking? Authorizing Provider  atorvastatin (LIPITOR) 20 MG tablet TAKE 1 TABLET (20 MG TOTAL) BY MOUTH DAILY.  09/17/17   [name], MD  Blood Glucose Monitoring Suppl ([place]) W/DEVICE KIT To use to check blood sugars once daily 02/01/15   Jackolyn Confer, MD  buPROPion (WELLBUTRIN XL) 150 MG 24 hr tablet Take 1 tablet (150 mg total) by mouth daily. 09/12/17   [name], MD  cholecalciferol (VITAMIN D) 1000 UNITS tablet Take 1,000 Units by mouth daily.      [provider]  citalopram (CELEXA) 20 MG tablet TAKE 1 TABLET (20 MG TOTAL) BY MOUTH DAILY. 06/25/17   [name], MD  clobetasol ointment (TEMOVATE) 0.05 % Apply topically 2 (two) times daily. 02/09/17   [name], MD  glipiZIDE (GLUCOTROL) 5 MG tablet Take 1 tablet (5 mg total) by mouth daily before breakfast. 09/14/17   [name], MD  glucose blood test strip To use to check blood sugar once daily 02/01/15   Jackolyn Confer, MD  levothyroxine (SYNTHROID, LEVOTHROID) 50 MCG tablet TAKE 1 TABLET (50 MCG TOTAL) BY MOUTH DAILY. 06/25/17   [name], MD  lisinopril-hydrochlorothiazide (PRINZIDE,ZESTORETIC) 10-12.5 MG tablet TAKE 1 TABLET BY MOUTH DAILY. 06/25/17   [name], MD  meloxicam (MOBIC) 15 MG tablet TAKE 1 TABLET (15 MG TOTAL) BY MOUTH DAILY AS NEEDED FOR PAIN. 10/15/17   [name], MD  metFORMIN (GLUCOPHAGE) 500 MG tablet TAKE 1 TABLET BY  MOUTH 2 TIMES DAILY WITH A MEAL. 09/03/17   [name], MD  ONE TOUCH LANCETS MISC To use to check blood sugar once daily 02/01/15   Jackolyn Confer, MD  pantoprazole (PROTONIX) 40 MG tablet Take 1 tablet (40 mg total) by mouth 2 (two) times daily for 14 days. 11/14/17 11/28/17  Merlyn Lot, MD    Allergies Patient has no known allergies.    Social History Social History   Tobacco Use  . Smoking status: Never Smoker  . Smokeless tobacco: Never Used  Substance Use Topics  . Alcohol use: Not on file  . Drug use: Not on file    Review of Systems Patient denies headaches, rhinorrhea,  blurry vision, numbness, shortness of breath, chest pain, edema, cough, abdominal pain, nausea, vomiting, diarrhea, dysuria, fevers, rashes or hallucinations unless otherwise stated above in HPI. ____________________________________________   PHYSICAL EXAM:  VITAL SIGNS: Vitals:   11/14/17 2136  BP: (!) 187/82  Pulse: 81  Resp: 18  Temp: 98.7 F (37.1 C)  SpO2: 96%    Constitutional: Alert and oriented. Upright and protecting his airway Eyes: Conjunctivae are normal.  Head: Atraumatic. Nose: No congestion/rhinnorhea. Mouth/Throat: Mucous membranes are moist.   Neck: No stridor. Painless ROM.  Cardiovascular: Normal rate, regular rhythm. Grossly normal heart sounds.  Good peripheral circulation. Respiratory: Normal respiratory effort.  No retractions. Lungs CTAB. Gastrointestinal: Soft and nontender. No distention. No abdominal bruits. No CVA tenderness. Musculoskeletal: No lower extremity tenderness nor edema.  No joint effusions. Neurologic:  Normal speech and language. No gross focal neurologic deficits are appreciated. No facial droop Skin:  Skin is warm, dry and intact. No rash noted. Psychiatric: Mood and affect are normal. Speech and behavior are normal.  ____________________________________________   LABS (all labs ordered are listed, but only abnormal results are displayed)  Results for orders placed or performed during the hospital encounter of 11/14/17 (from the past 24 hour(s))  CBC with Differential/Platelet     Status: None   Collection Time: 11/14/17 10:02 PM  Result Value Ref Range   WBC 7.0 3.8 - 10.6 K/uL   RBC 4.66 4.40 - 5.90 MIL/uL   Hemoglobin 14.5 13.0 - 18.0 g/dL   HCT 42.3 40.0 - 52.0 %   MCV 90.8 80.0 - 100.0 fL   MCH 31.1 26.0 - 34.0 pg   MCHC 34.3 32.0 - 36.0 g/dL   RDW 13.6 11.5 - 14.5 %   Platelets 224 150 - 440 K/uL   Neutrophils Relative % 62 %   Neutro Abs 4.4 1.4 - 6.5 K/uL   Lymphocytes Relative 25 %   Lymphs Abs 1.7 1.0 - 3.6 K/uL    Monocytes Relative 9 %   Monocytes Absolute 0.6 0.2 - 1.0 K/uL   Eosinophils Relative 3 %   Eosinophils Absolute 0.2 0 - 0.7 K/uL   Basophils Relative 1 %   Basophils Absolute 0.1 0 - 0.1 K/uL  Basic metabolic panel     Status: Abnormal   Collection Time: 11/14/17 10:02 PM  Result Value Ref Range   Sodium 138 135 - 145 mmol/L   Potassium 4.0 3.5 - 5.1 mmol/L   Chloride 102 101 - 111 mmol/L   CO2 24 22 - 32 mmol/L   Glucose, Bld 111 (H) 65 - 99 mg/dL   BUN 12 6 - 20 mg/dL   Creatinine, Ser 0.74 0.61 - 1.24 mg/dL   Calcium 9.4 8.9 - 10.3 mg/dL   GFR calc non Af Amer >60 >  60 mL/min   GFR calc Af Amer >60 >60 mL/min   Anion gap 12 5 - 15   ____________________________________________  EKG My review and personal interpretation at Time: 22:01   Indication: chest pain  Rate: 75  Rhythm: sinus Axis: normal  Other: no stemi, normal intervals ____________________________________________  RADIOLOGY  I personally reviewed all radiographic images ordered to evaluate for the above acute complaints and reviewed radiology reports and findings.  These findings were personally discussed with the patient.  Please see medical record for radiology report.  ____________________________________________   PROCEDURES  Procedure(s) performed:  Procedures    Critical Care performed: no ____________________________________________   INITIAL IMPRESSION / ASSESSMENT AND PLAN / ED COURSE  Pertinent labs & imaging results that were available during my care of the patient were reviewed by me and considered in my medical decision making (see chart for details).  DDX: Bolus impaction, esophagitis, esophageal spasm  Jamez Ambrocio is a 71 y.o. who presents to the ED with symptoms as described above consistent with food bolus impaction.  Will trial glucagon.  Abdominal exam otherwise soft and benign  Clinical Course as of Nov 14 2299  Wed Nov 14, 2017  2216 No improvement with glucagon  or Ativan.  Will speak with GI.  [PR]  2238 Patient reassessed after second dose of Ativan and patient now tolerating oral hydration.  States his symptoms have resolved.  We will re-paged Dr. Blenda Bridegroom of GI.  [PR]    Clinical Course User Index [PR] Merlyn Lot, MD   Patient reassessed and in no acute distress.  Patient able to tolerate 2 cups of water with no discomfort.  Food bolus has passed.  Spoke with Dr. Lenon Ahmadi is agreed to perform endoscopy in the morning if the patient agrees to stay or can see him in clinic for scheduled outpatient endoscopy.  Patient will follow with GI for outpatient endoscopy.  Will start on Protonix.  Discussed strict return precautions.   ____________________________________________   FINAL CLINICAL IMPRESSION(S) / ED DIAGNOSES  Final diagnoses:  Impacted esophageal foreign body, initial encounter      NEW MEDICATIONS STARTED DURING THIS VISIT:  This SmartLink is deprecated. Use AVSMEDLIST instead to display the medication list for a patient.   Note:  This document was prepared using Dragon voice recognition software and may include unintentional dictation errors.    Merlyn Lot, MD 11/14/17 2300

## 2017-11-14 NOTE — ED Notes (Signed)
Attempted big swallow of warm soda in triage without success.

## 2017-11-15 ENCOUNTER — Telehealth: Payer: Self-pay

## 2017-11-15 NOTE — Telephone Encounter (Signed)
-----   Message from Candelero AbajoPanya Carter, [place] sent at 11/15/2017  3:14 PM EST ----- Regarding: EGD   ----- Message ----- From: Pasty Spillersahiliani, Varnita B, MD Sent: 11/15/2017  10:21 AM To: Ethlyn GalleryPanya Carter, CMA  Panya, this patient was in the ER last night with a food impaction that passed after ativan. He went home because he did not want to stay inpatient. Can you call him and set him up for an EGD outpatient next available with anyone. If he insists on not having an EGD have him follow up with me in clinic next available please. Thank You.

## 2017-12-21 ENCOUNTER — Ambulatory Visit: Payer: 59 | Admitting: Family Medicine

## 2017-12-24 ENCOUNTER — Other Ambulatory Visit: Payer: Self-pay | Admitting: Family Medicine

## 2018-01-04 ENCOUNTER — Ambulatory Visit: Payer: 59 | Admitting: Family Medicine

## 2018-01-07 ENCOUNTER — Encounter: Payer: Self-pay | Admitting: Family Medicine

## 2018-01-07 ENCOUNTER — Other Ambulatory Visit: Payer: Self-pay

## 2018-01-07 ENCOUNTER — Ambulatory Visit (INDEPENDENT_AMBULATORY_CARE_PROVIDER_SITE_OTHER): Payer: 59 | Admitting: Family Medicine

## 2018-01-07 VITALS — BP 132/82 | HR 68 | Temp 98.3°F | Wt 244.6 lb

## 2018-01-07 DIAGNOSIS — E669 Obesity, unspecified: Secondary | ICD-10-CM

## 2018-01-07 DIAGNOSIS — L309 Dermatitis, unspecified: Secondary | ICD-10-CM | POA: Insufficient documentation

## 2018-01-07 DIAGNOSIS — I10 Essential (primary) hypertension: Secondary | ICD-10-CM | POA: Diagnosis not present

## 2018-01-07 DIAGNOSIS — F330 Major depressive disorder, recurrent, mild: Secondary | ICD-10-CM

## 2018-01-07 DIAGNOSIS — E1169 Type 2 diabetes mellitus with other specified complication: Secondary | ICD-10-CM | POA: Diagnosis not present

## 2018-01-07 DIAGNOSIS — T18108A Unspecified foreign body in esophagus causing other injury, initial encounter: Secondary | ICD-10-CM | POA: Insufficient documentation

## 2018-01-07 LAB — POCT GLYCOSYLATED HEMOGLOBIN (HGB A1C): HEMOGLOBIN A1C: 6.1

## 2018-01-07 MED ORDER — TRIAMCINOLONE ACETONIDE 0.1 % EX CREA: 1.0000 "application " | TOPICAL_CREAM | Freq: Two times a day (BID) | CUTANEOUS | 0 refills | Status: DC

## 2018-01-07 MED ORDER — BUPROPION HCL ER (XL) 300 MG PO TB24: 300.0000 mg | ORAL_TABLET | Freq: Every day | ORAL | 3 refills | Status: DC

## 2018-01-07 NOTE — Assessment & Plan Note (Signed)
Contact or mechanical dermatitis on his bilateral ankles.  No signs of infection.  We will treat with topical triamcinolone.  If not improving he will let us know.

## 2018-01-07 NOTE — Assessment & Plan Note (Addendum)
Improved CBGs.  A1c is much improved.  Discussed potentially discontinuing glipizide though the patient is hesitant as this has worked well for him.  He will monitor sugars and if they start to drop low he will discontinue the glipizide and let us know.  Ophthalmology referral placed.

## 2018-01-07 NOTE — Assessment & Plan Note (Signed)
Slight worsening.  We will increase his Wellbutrin.  He will follow-up in 3 months.  Given return precautions.

## 2018-01-07 NOTE — Progress Notes (Signed)
Timothy Alar, MD Phone: [phone]  Timothy Edwards is a 72 y.o. male who presents today for follow-up.  Hypertension: Patient notes typically blood pressure is around 130 systolically.  Taking lisinopril and HCTZ.  No chest pain, shortness of breath, or edema.  Diabetes: Blood sugars are quite a bit improved with dietary changes.  Notes he has had some into the 80s.  Rare hypoglycemic symptoms with sweating.  He will eat some lifesavers and feel better.  He is taking glipizide and metformin.  No polyuria or polydipsia.  Patient notes he got new shoes for Christmas and over the last month or so he has had some irritation over his superior dorsal ankles from where the shoe was rubbing.  Notes it itches.  He does pick at it.  No signs of infection.  Patient was evaluated in the emergency department for food getting stuck in his esophagus.  He had been eating pot roast and swallowed a big chunk of it and it got stuck.  He was not able to swallow anything else after that stuck.  No trouble breathing.  He was given medication to help him pass this and eventually he did.  They recommended following up with GI for an EGD.  He has declined that.  He notes no trouble swallowing since then.  Patient does note some issues with depression.  Notes this might be slightly worse now.  He wants to know about going up on the Wellbutrin.  He continues on the Celexa.  He notes no SI.  Social History   Tobacco Use  Smoking Status Never Smoker  Smokeless Tobacco Never Used     ROS see history of present illness  Objective  Physical Exam Vitals:   01/07/18 1001 01/07/18 1027  BP: (!) 158/80 132/82  Pulse: 68   Temp: 98.3 F (36.8 C)   SpO2: 96%     BP Readings from Last 3 Encounters:  01/07/18 132/82  11/14/17 (!) 152/82  10/03/17 136/78   Wt Readings from Last 3 Encounters:  01/07/18 244 lb 9.6 oz (110.9 kg)  11/14/17 236 lb (107 kg)  10/03/17 241 lb 9.6 oz (109.6 kg)     Physical Exam  Constitutional: No distress.  Cardiovascular: Normal rate, regular rhythm and normal heart sounds.  Pulmonary/Chest: Effort normal and breath sounds normal.  Musculoskeletal: He exhibits no edema.       Legs: Neurological: He is alert. Gait normal.  Skin: Skin is warm and dry. He is not diaphoretic.  No signs of infection on either lesion at his ankles  Diabetic Foot Exam - Simple   Simple Foot Form Diabetic Foot exam was performed with the following findings:  Yes 01/07/2018 10:33 AM  Visual Inspection See comments:  Yes Sensation Testing Intact to touch and monofilament testing bilaterally:  Yes Pulse Check Posterior Tibialis and Dorsalis pulse intact bilaterally:  Yes Comments Small bruise on left great toenail that patient notes is related to dropping a wrench on his toenail.  See exam above, no other deformities, ulcerations, or skin     Assessment/Plan: Please see individual problem list.  Hypertension Improved on recheck.  Well controlled at home.  Continue current regimen.  Diabetes mellitus type 2 in obese Improved CBGs.  A1c is much improved.  Discussed potentially discontinuing glipizide though the patient is hesitant as this has worked well for him.  He will monitor sugars and if they start to drop low he will discontinue the glipizide and let us know.  Ophthalmology referral placed.  Dermatitis Contact or mechanical dermatitis on his bilateral ankles.  No signs of infection.  We will treat with topical triamcinolone.  If not improving he will let us know.  Depression Slight worsening.  We will increase his Wellbutrin.  He will follow-up in 3 months.  Given return precautions.  Impacted esophageal foreign body No recurrence.  Discussed seeing GI to rule out other causes with EGD though he declines this.  If he changes his mind he will let us know.   Orders Placed This Encounter  Procedures  . Ambulatory referral to Ophthalmology    Referral  Priority:   Routine    Referral Type:   Consultation    Referral Reason:   Specialty Services Required    Requested Specialty:   Ophthalmology    Number of Visits Requested:   1  . POCT HgB A1C    Meds ordered this encounter  Medications  . triamcinolone cream (KENALOG) 0.1 %    Sig: Apply 1 application topically 2 (two) times daily.    Dispense:  30 g    Refill:  0  . buPROPion (WELLBUTRIN XL) 300 MG 24 hr tablet    Sig: Take 1 tablet (300 mg total) by mouth daily.    Dispense:  30 tablet    Refill:  3     Timothy AlarEric Xitlalli Newhard, MD [facility] [place]

## 2018-01-07 NOTE — Assessment & Plan Note (Signed)
No recurrence.  Discussed seeing GI to rule out other causes with EGD though he declines this.  If he changes his mind he will let us know.

## 2018-01-07 NOTE — Assessment & Plan Note (Signed)
Improved on recheck.  Well controlled at home.  Continue current regimen.

## 2018-01-07 NOTE — Patient Instructions (Addendum)
Nice to see you. Please use the triamcinolone on the rash on your ankles. If you change your mind about seeing GI please let us know. Please continue to monitor your sugars.  If you start to go a little more frequently please let us know.

## 2018-02-18 ENCOUNTER — Other Ambulatory Visit: Payer: Self-pay | Admitting: Family Medicine

## 2018-02-25 ENCOUNTER — Other Ambulatory Visit: Payer: Self-pay | Admitting: Family Medicine

## 2018-03-06 ENCOUNTER — Other Ambulatory Visit: Payer: Self-pay | Admitting: Family Medicine

## 2018-03-21 ENCOUNTER — Other Ambulatory Visit: Payer: Self-pay | Admitting: Family Medicine

## 2018-03-31 ENCOUNTER — Encounter: Payer: Self-pay | Admitting: Emergency Medicine

## 2018-03-31 ENCOUNTER — Ambulatory Visit
Admission: EM | Admit: 2018-03-31 | Discharge: 2018-03-31 | Disposition: A | Discharge status: Home / Self Care | Payer: 59 | Attending: Family Medicine | Admitting: Family Medicine

## 2018-03-31 ENCOUNTER — Other Ambulatory Visit: Payer: Self-pay

## 2018-03-31 DIAGNOSIS — K0889 Other specified disorders of teeth and supporting structures: Secondary | ICD-10-CM

## 2018-03-31 DIAGNOSIS — K029 Dental caries, unspecified: Secondary | ICD-10-CM

## 2018-03-31 HISTORY — DX: Type 2 diabetes mellitus without complications: E11.9

## 2018-03-31 MED ORDER — HYDROCODONE-ACETAMINOPHEN 5-325 MG PO TABS: 1.0000 | ORAL_TABLET | Freq: Four times a day (QID) | ORAL | 0 refills | Status: DC | PRN

## 2018-03-31 MED ORDER — AMOXICILLIN-POT CLAVULANATE 875-125 MG PO TABS: 1.0000 | ORAL_TABLET | Freq: Two times a day (BID) | ORAL | 0 refills | Status: DC

## 2018-03-31 NOTE — ED Provider Notes (Signed)
[facility]    CSN: [id number] Arrival date & time: 03/31/18  1223  History   Chief Complaint Chief Complaint  Patient presents with  . Dental Pain    APPT   HPI  72 year old male presents with dental pain.  Patient reports a one-week history of dental pain.  Patient has both right upper and right lower dental pain.  He has dental caries and is concerned that he may have an abscess.  He has an upcoming appointment with his dentist/oral surgeon on May 1 but felt as if he was worsening and decided to come in for evaluation.  No fever.  No other reported symptoms.  Areas are very tender.  No other complaints or concerns at this time.  Past Medical History:  Diagnosis Date  . Depression   . Diabetes mellitus without complication ([place])   . Hyperlipidemia   . Hypertension   . Unspecified hypothyroidism   . Vitamin D deficiency     Patient Active Problem List   Diagnosis Date Noted  . Dermatitis 01/07/2018  . Impacted esophageal foreign body 01/07/2018  . Neck stiffness 12/29/2016  . Left lateral knee pain 05/11/2016  . Depression 04/27/2015  . Arthritis 01/26/2015  . Elevated LFTs 07/27/2014  . Memory loss 05/05/2013  . Diabetes mellitus type 2 in obese ([place]) 03/18/2013  . Vitamin D deficiency   . Hypertension 10/11/2011  . Hyperlipidemia 10/11/2011  . Hypothyroidism 10/11/2011  . Generalized anxiety disorder 10/11/2011  . Allergic rhinitis 10/11/2011  . Obesity (BMI 30-39.9) 10/11/2011    Past Surgical History:  Procedure Laterality Date  . FOOT SURGERY         Home Medications    Prior to Admission medications   Medication Sig Start Date End Date Taking? Authorizing Provider  atorvastatin (LIPITOR) 20 MG tablet TAKE 1 TABLET (20 MG TOTAL) BY MOUTH DAILY. 03/25/18  Yes [name], MD  buPROPion (WELLBUTRIN XL) 300 MG 24 hr tablet Take 1 tablet (300 mg total) by mouth daily. 01/07/18  Yes [name], MD  cholecalciferol (VITAMIN D)  1000 UNITS tablet Take 1,000 Units by mouth daily.     Yes [provider]  citalopram (CELEXA) 20 MG tablet TAKE 1 TABLET (20 MG TOTAL) BY MOUTH DAILY. 12/24/17  Yes [name], MD  clobetasol ointment (TEMOVATE) 0.05 % Apply topically 2 (two) times daily. 02/09/17  Yes [name], MD  glipiZIDE (GLUCOTROL) 5 MG tablet Take 1 tablet (5 mg total) by mouth daily before breakfast. 09/14/17  Yes [name], MD  GLIPIZIDE XL 5 MG 24 hr tablet TAKE 1 TABLET (5 MG TOTAL) BY MOUTH DAILY WITH BREAKFAST. 02/25/18  Yes [name], MD  levothyroxine (SYNTHROID, LEVOTHROID) 50 MCG tablet TAKE 1 TABLET (50 MCG TOTAL) BY MOUTH DAILY. 12/24/17  Yes [name], MD  lisinopril-hydrochlorothiazide (PRINZIDE,ZESTORETIC) 10-12.5 MG tablet TAKE 1 TABLET BY MOUTH DAILY. 12/24/17  Yes [name], MD  meloxicam (MOBIC) 15 MG tablet TAKE 1 TABLET (15 MG TOTAL) BY MOUTH DAILY AS NEEDED FOR PAIN. 02/18/18  Yes [name], MD  metFORMIN (GLUCOPHAGE) 500 MG tablet TAKE 1 TABLET BY MOUTH 2 TIMES DAILY WITH A MEAL. 03/06/18  Yes [name], MD  pantoprazole (PROTONIX) 40 MG tablet Take 1 tablet (40 mg total) by mouth 2 (two) times daily for 14 days. 11/14/17 03/31/18 Yes Merlyn Lot, MD  triamcinolone cream (KENALOG) 0.1 % Apply 1 application topically 2 (two) times daily. 01/07/18  Yes [name], MD  amoxicillin-clavulanate (AUGMENTIN) 875-125 MG tablet Take 1 tablet by mouth every 12 (twelve) hours. 03/31/18   Coral Spikes, DO  Blood Glucose Monitoring Suppl ([place]) W/DEVICE KIT To use to check blood sugars once daily 02/01/15   Jackolyn Confer, MD  glucose blood test strip To use to check blood sugar once daily 02/01/15   Jackolyn Confer, MD  HYDROcodone-acetaminophen (NORCO/VICODIN) 5-325 MG tablet Take 1 tablet by mouth every 6 (six) hours as needed. 03/31/18   Coral Spikes, DO  ONE TOUCH LANCETS MISC To use to check blood sugar  once daily 02/01/15   Jackolyn Confer, MD    Family History Family History  Problem Relation Age of Onset  . Obesity Father   . Aneurysm Father   . Hypertension Father   . Gallbladder disease Mother     Social History Social History   Tobacco Use  . Smoking status: Never Smoker  . Smokeless tobacco: Never Used  Substance Use Topics  . Alcohol use: Yes    Alcohol/week: 1.8 oz    Types: 3 Glasses of wine per week  . Drug use: Never     Allergies   Patient has no known allergies.   Review of Systems Review of Systems  Constitutional: Negative.   HENT: Positive for dental problem.    Physical Exam Triage Vital Signs ED Triage Vitals  Enc Vitals Group     BP 03/31/18 1231 (!) 156/93     Pulse Rate 03/31/18 1231 68     Resp 03/31/18 1231 16     Temp 03/31/18 1231 98.2 F (36.8 C)     Temp Source 03/31/18 1231 Oral     SpO2 03/31/18 1231 99 %     Weight 03/31/18 1230 244 lb (110.7 kg)     Height 03/31/18 1230 5' 11"  (1.803 m)     Head Circumference --      Peak Flow --      Pain Score 03/31/18 1230 7     Pain Loc --      Pain Edu? --      Excl. in [place]? --    Updated Vital Signs BP (!) 156/93 (BP Location: Left Arm)   Pulse 68   Temp 98.2 F (36.8 C) (Oral)   Resp 16   Ht 5' 11"  (1.803 m)   Wt 244 lb (110.7 kg)   SpO2 99%   BMI 34.03 kg/m   Physical Exam  Constitutional: He is oriented to person, place, and time. He appears well-developed. No distress.  HENT:  Mouth/Throat: Oropharynx is clear and moist.  Patient with multiple dental caries.  No discrete abscess noted.  Eyes: Conjunctivae are normal. Right eye exhibits no discharge. Left eye exhibits no discharge.  Cardiovascular: Normal rate and regular rhythm.  Pulmonary/Chest: Effort normal and breath sounds normal.  Neurological: He is alert and oriented to person, place, and time.  Psychiatric: He has a normal mood and affect. His behavior is normal.  Nursing note and vitals reviewed.  UC  Treatments / Results  Labs (all labs ordered are listed, but only abnormal results are displayed) Labs Reviewed - No data to display  EKG None Radiology No results found.  Procedures Procedures (including critical care time)  Medications Ordered in UC Medications - No data to display   Initial Impression / Assessment and Plan / UC Course  I have reviewed the triage vital signs and the  nursing notes.  Pertinent labs & imaging results that were available during my care of the patient were reviewed by me and considered in my medical decision making (see chart for details).    72 year old male presents with dental pain.  Has multiple caries.  Has an upcoming appointment.  Treating pain with Vicodin.  Antibiotic as prescribed.  Final Clinical Impressions(s) / UC Diagnoses   Final diagnoses:  Pain, dental    ED Discharge Orders        Ordered    amoxicillin-clavulanate (AUGMENTIN) 875-125 MG tablet  Every 12 hours     03/31/18 1250    HYDROcodone-acetaminophen (NORCO/VICODIN) 5-325 MG tablet  Every 6 hours PRN     03/31/18 1250     Controlled Substance Prescriptions [place] Controlled Substance Registry consulted? Yes, I have consulted the [place] Controlled Substances Registry for this patient, and feel the risk/benefit ratio today is favorable for proceeding with this prescription for a controlled substance.   Coral Spikes, [place] 03/31/18 1319

## 2018-03-31 NOTE — ED Triage Notes (Signed)
Patient in today c/o dental pain x 1 week. Patient has appointment with dentist on May 1, but pain has gotten worse and didn't think he could wait until then.

## 2018-03-31 NOTE — Discharge Instructions (Signed)
Medications as prescribed.  Be sure to see your dentist/oral surgeon.  Take care  Dr. Adriana Simas

## 2018-04-15 ENCOUNTER — Ambulatory Visit: Payer: 59 | Admitting: Family Medicine

## 2018-05-27 ENCOUNTER — Other Ambulatory Visit: Payer: Self-pay | Admitting: Family Medicine

## 2018-06-18 ENCOUNTER — Telehealth: Payer: Self-pay | Admitting: Family Medicine

## 2018-06-18 NOTE — Telephone Encounter (Unsigned)
Copied from CRM [id number]. Topic: Quick Communication - See Telephone Encounter >> Jun 18, 2018  5:20 PM Raquel SarnaHayes, Teresa G wrote: Pt is needing to know if he needs to [facility]  Pt's last appt was cancelled - per provider. Pt was unsure if he needed to continue with appts. Please call pt back to discuss.

## 2018-06-19 NOTE — Telephone Encounter (Signed)
Patient notified and scheduled 

## 2018-06-19 NOTE — Telephone Encounter (Signed)
Please advise 

## 2018-06-19 NOTE — Telephone Encounter (Signed)
He should have his appointment rescheduled. Thanks.

## 2018-06-24 ENCOUNTER — Other Ambulatory Visit: Payer: Self-pay | Admitting: Family Medicine

## 2018-06-25 NOTE — Telephone Encounter (Signed)
Last OV 01/07/18 last filled 02/18/18 30 3rf

## 2018-07-01 ENCOUNTER — Other Ambulatory Visit: Payer: Self-pay | Admitting: Family Medicine

## 2018-07-10 ENCOUNTER — Other Ambulatory Visit: Payer: Self-pay

## 2018-07-10 ENCOUNTER — Encounter: Payer: Self-pay | Admitting: Family Medicine

## 2018-07-10 ENCOUNTER — Ambulatory Visit (INDEPENDENT_AMBULATORY_CARE_PROVIDER_SITE_OTHER): Payer: 59 | Admitting: Family Medicine

## 2018-07-10 VITALS — BP 142/86 | HR 70 | Temp 98.4°F | Wt 249.8 lb

## 2018-07-10 DIAGNOSIS — E669 Obesity, unspecified: Secondary | ICD-10-CM | POA: Diagnosis not present

## 2018-07-10 DIAGNOSIS — F330 Major depressive disorder, recurrent, mild: Secondary | ICD-10-CM

## 2018-07-10 DIAGNOSIS — E785 Hyperlipidemia, unspecified: Secondary | ICD-10-CM | POA: Diagnosis not present

## 2018-07-10 DIAGNOSIS — I10 Essential (primary) hypertension: Secondary | ICD-10-CM

## 2018-07-10 DIAGNOSIS — Z1159 Encounter for screening for other viral diseases: Secondary | ICD-10-CM | POA: Diagnosis not present

## 2018-07-10 DIAGNOSIS — E1169 Type 2 diabetes mellitus with other specified complication: Secondary | ICD-10-CM | POA: Diagnosis not present

## 2018-07-10 DIAGNOSIS — E039 Hypothyroidism, unspecified: Secondary | ICD-10-CM | POA: Diagnosis not present

## 2018-07-10 DIAGNOSIS — L309 Dermatitis, unspecified: Secondary | ICD-10-CM | POA: Diagnosis not present

## 2018-07-10 LAB — COMPREHENSIVE METABOLIC PANEL
ALBUMIN: 4.1 g/dL (ref 3.5–5.2)
ALK PHOS: 51 U/L (ref 39–117)
ALT: 31 U/L (ref 0–53)
AST: 30 U/L (ref 0–37)
BILIRUBIN TOTAL: 0.7 mg/dL (ref 0.2–1.2)
BUN: 16 mg/dL (ref 6–23)
CO2: 31 mEq/L (ref 19–32)
Calcium: 9.6 mg/dL (ref 8.4–10.5)
Chloride: 102 mEq/L (ref 96–112)
Creatinine, Ser: 0.88 mg/dL (ref 0.40–1.50)
GFR: 90.01 mL/min (ref >60.00)
GLUCOSE: 108 mg/dL — AB (ref 70–99)
POTASSIUM: 4.3 meq/L (ref 3.5–5.1)
Sodium: 140 mEq/L (ref 135–145)
TOTAL PROTEIN: 6.6 g/dL (ref 6.0–8.3)

## 2018-07-10 LAB — TSH: TSH: 2.11 u[IU]/mL (ref 0.35–4.50)

## 2018-07-10 LAB — LDL CHOLESTEROL, DIRECT: LDL DIRECT: 87 mg/dL

## 2018-07-10 LAB — HEMOGLOBIN A1C: Hgb A1c MFr Bld: 6.6 % — ABNORMAL HIGH (ref 4.6–6.5)

## 2018-07-10 MED ORDER — CLOBETASOL PROPIONATE 0.05 % EX OINT: TOPICAL_OINTMENT | Freq: Two times a day (BID) | CUTANEOUS | 0 refills | Status: DC

## 2018-07-10 NOTE — Progress Notes (Signed)
  [name], MD Phone: [phone]  Timothy Edwards is a 72 y.o. male who presents today for f/u.  CC: DM, HTN, depression, dermatitis  DIABETES Disease Monitoring: Blood Sugar ranges-137 highest fasting Polyuria/phagia/dipsia- no      Optho- patient to schedule an appointment Medications: Compliance- taking glipizide, metformin Hypoglycemic symptoms- occasional shakes if he does not eat breakfast, eats and resolves  HYPERTENSION  Disease Monitoring  Home BP Monitoring better than here  Chest pain- no    Dyspnea- no Medications  Compliance-  Taking lisinopril/hctz.   Edema- no  Depression: Patient notes this is stable.  Has not worsened.  No SI.  Continues on Celexa and Wellbutrin.  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  He was working with gasoline recently noted several days later his fingers and hand started to peel and crack a little bit.  In the past he is used clobetasol for this.     Social History   Tobacco Use  Smoking Status Never Smoker  Smokeless Tobacco Never Used     ROS see history of present illness  Objective  Physical Exam Vitals:   07/10/18 0907 07/10/18 0928  BP: (!) 142/66 (!) 142/86  Pulse: 70   Temp: 98.4 F (36.9 C)   SpO2: 95%     BP Readings from Last 3 Encounters:  07/10/18 (!) 142/86  03/31/18 (!) 156/93  01/07/18 132/82   Wt Readings from Last 3 Encounters:  07/10/18 249 lb 12.8 oz (113.3 kg)  03/31/18 244 lb (110.7 kg)  01/07/18 244 lb 9.6 oz (110.9 kg)    Physical Exam  Constitutional: No distress.  Cardiovascular: Normal rate, regular rhythm and normal heart sounds.  Pulmonary/Chest: Effort normal and breath sounds normal.  Musculoskeletal: He exhibits no edema.  Neurological: He is alert.  Skin: Skin is warm and dry. He is not diaphoretic.  Slight peeling and cracking of the skin on his bilateral hands and  fingers, no drainage, no erythema     Assessment/Plan: Please see individual problem list.  Hypertension Seems to be well-controlled at home.  We will continue to monitor.  Continue current regimen.  Check lab work.  Hypothyroidism Check TSH.  Diabetes mellitus type 2 in obese Check A1c.  Continue current regimen.  Dermatitis Suspect dermatitis and skin irritation related to the chemicals he was working with.  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  We will refill his clobetasol.  If not improving he will let us know.  Discussed signs of infection and return precautions.  Hyperlipidemia Check LDL.  Depression Stable.  Continue current medication.   Health Maintenance: Patient given information on Cologuard for colon cancer screening.  Hepatitis C screening ordered.  Orders Placed This Encounter  Procedures  . Comp Met (CMET)  . Direct LDL  . HgB A1c  . TSH  . Hepatitis C Antibody    Meds ordered this encounter  Medications  . clobetasol ointment (TEMOVATE) 0.05 %    Sig: Apply topically 2 (two) times daily.    Dispense:  30 g    Refill:  0     [name], MD [place]

## 2018-07-10 NOTE — Assessment & Plan Note (Signed)
Suspect dermatitis and skin irritation related to the chemicals he was working with.  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  We will refill his clobetasol.  If not improving he will let us know.  Discussed signs of infection and return precautions.

## 2018-07-10 NOTE — Assessment & Plan Note (Signed)
Check A1c.  Continue current regimen. 

## 2018-07-10 NOTE — Assessment & Plan Note (Signed)
Seems to be well-controlled at home.  We will continue to monitor.  Continue current regimen.  Check lab work.

## 2018-07-10 NOTE — Patient Instructions (Signed)
Nice to see you. We will get lab work today and contact you with the results. Please let us know if you would like to proceed with Cologuard. If your hands do not improve please let us know.  If you develop redness, drainage, or pain please seek medical attention.

## 2018-07-10 NOTE — Assessment & Plan Note (Signed)
Stable.  Continue current medication

## 2018-07-10 NOTE — Assessment & Plan Note (Signed)
Check LDL. 

## 2018-07-10 NOTE — Assessment & Plan Note (Signed)
Check TSH 

## 2018-07-11 ENCOUNTER — Telehealth: Payer: Self-pay | Admitting: Family Medicine

## 2018-07-11 LAB — HEPATITIS C ANTIBODY
HEP C AB: NONREACTIVE
SIGNAL TO CUT-OFF: 0.03 (ref <1.00)

## 2018-07-11 MED ORDER — CLOBETASOL PROPIONATE 0.05 % EX OINT: TOPICAL_OINTMENT | Freq: Two times a day (BID) | CUTANEOUS | 0 refills | Status: DC

## 2018-07-11 NOTE — Telephone Encounter (Signed)
Sent to [facility] 

## 2018-07-11 NOTE — Telephone Encounter (Signed)
Copied from CRM [id number]. Topic: Quick Communication - Rx Refill/Question >> Jul 11, 2018 12:41 PM Zada GirtLander, Lumin L wrote: Medication: clobetasol ointment (TEMOVATE) 0.05 % (was sent to wrong pharmacy)  Has the patient contacted their pharmacy? Yes.   (Agent: If no, request that the patient contact the pharmacy for the refill.) (Agent: If yes, when and what did the pharmacy advise?)  Preferred Pharmacy (with phone number or street name): [facility] [place] [address] [place] [id number] Phone: [phone] Fax: [phone]   Agent: Please be advised that RX refills may take up to 3 business days. We ask that you follow-up with your pharmacy.

## 2018-07-15 ENCOUNTER — Emergency Department: Payer: 59 | Admitting: Anesthesiology

## 2018-07-15 ENCOUNTER — Encounter
Admission: EM | Disposition: A | Discharge status: Home / Self Care | Payer: Self-pay | Source: Home / Self Care | Attending: Emergency Medicine

## 2018-07-15 ENCOUNTER — Other Ambulatory Visit: Payer: Self-pay

## 2018-07-15 ENCOUNTER — Emergency Department
Admission: EM | Admit: 2018-07-15 | Discharge: 2018-07-15 | Disposition: A | Discharge status: Home / Self Care | Payer: 59 | Attending: Emergency Medicine | Admitting: Emergency Medicine

## 2018-07-15 DIAGNOSIS — X58XXXA Exposure to other specified factors, initial encounter: Secondary | ICD-10-CM | POA: Diagnosis not present

## 2018-07-15 DIAGNOSIS — F411 Generalized anxiety disorder: Secondary | ICD-10-CM | POA: Diagnosis not present

## 2018-07-15 DIAGNOSIS — Z7984 Long term (current) use of oral hypoglycemic drugs: Secondary | ICD-10-CM | POA: Diagnosis not present

## 2018-07-15 DIAGNOSIS — T182XXA Foreign body in stomach, initial encounter: Secondary | ICD-10-CM | POA: Diagnosis not present

## 2018-07-15 DIAGNOSIS — K449 Diaphragmatic hernia without obstruction or gangrene: Secondary | ICD-10-CM | POA: Diagnosis not present

## 2018-07-15 DIAGNOSIS — Z79899 Other long term (current) drug therapy: Secondary | ICD-10-CM | POA: Insufficient documentation

## 2018-07-15 DIAGNOSIS — K2211 Ulcer of esophagus with bleeding: Secondary | ICD-10-CM | POA: Diagnosis not present

## 2018-07-15 DIAGNOSIS — E785 Hyperlipidemia, unspecified: Secondary | ICD-10-CM | POA: Diagnosis not present

## 2018-07-15 DIAGNOSIS — K210 Gastro-esophageal reflux disease with esophagitis: Secondary | ICD-10-CM | POA: Diagnosis not present

## 2018-07-15 DIAGNOSIS — Z6834 Body mass index (BMI) 34.0-34.9, adult: Secondary | ICD-10-CM | POA: Insufficient documentation

## 2018-07-15 DIAGNOSIS — F329 Major depressive disorder, single episode, unspecified: Secondary | ICD-10-CM | POA: Diagnosis not present

## 2018-07-15 DIAGNOSIS — E669 Obesity, unspecified: Secondary | ICD-10-CM | POA: Diagnosis not present

## 2018-07-15 DIAGNOSIS — E119 Type 2 diabetes mellitus without complications: Secondary | ICD-10-CM | POA: Insufficient documentation

## 2018-07-15 DIAGNOSIS — T183XXA Foreign body in small intestine, initial encounter: Secondary | ICD-10-CM | POA: Diagnosis not present

## 2018-07-15 DIAGNOSIS — K222 Esophageal obstruction: Secondary | ICD-10-CM | POA: Diagnosis not present

## 2018-07-15 DIAGNOSIS — I10 Essential (primary) hypertension: Secondary | ICD-10-CM | POA: Diagnosis not present

## 2018-07-15 DIAGNOSIS — E559 Vitamin D deficiency, unspecified: Secondary | ICD-10-CM | POA: Diagnosis not present

## 2018-07-15 DIAGNOSIS — T18128A Food in esophagus causing other injury, initial encounter: Secondary | ICD-10-CM | POA: Diagnosis not present

## 2018-07-15 DIAGNOSIS — K2210 Ulcer of esophagus without bleeding: Secondary | ICD-10-CM | POA: Diagnosis not present

## 2018-07-15 DIAGNOSIS — E8881 Metabolic syndrome: Secondary | ICD-10-CM | POA: Insufficient documentation

## 2018-07-15 DIAGNOSIS — Z79890 Hormone replacement therapy (postmenopausal): Secondary | ICD-10-CM | POA: Diagnosis not present

## 2018-07-15 DIAGNOSIS — E039 Hypothyroidism, unspecified: Secondary | ICD-10-CM | POA: Diagnosis not present

## 2018-07-15 HISTORY — PX: ESOPHAGOGASTRODUODENOSCOPY (EGD) WITH PROPOFOL: SHX5813

## 2018-07-15 LAB — CBC
HCT: 44 % (ref 40.0–52.0)
Hemoglobin: 15.6 g/dL (ref 13.0–18.0)
MCH: 32.6 pg (ref 26.0–34.0)
MCHC: 35.5 g/dL (ref 32.0–36.0)
MCV: 91.6 fL (ref 80.0–100.0)
PLATELETS: 258 10*3/uL (ref 150–440)
RBC: 4.8 MIL/uL (ref 4.40–5.90)
RDW: 13.2 % (ref 11.5–14.5)
WBC: 8.8 10*3/uL (ref 3.8–10.6)

## 2018-07-15 LAB — BASIC METABOLIC PANEL
Anion gap: 10 (ref 5–15)
BUN: 16 mg/dL (ref 8–23)
CHLORIDE: 104 mmol/L (ref 98–111)
CO2: 26 mmol/L (ref 22–32)
CREATININE: 0.77 mg/dL (ref 0.61–1.24)
Calcium: 9.7 mg/dL (ref 8.9–10.3)
GFR calc Af Amer: >60 mL/min (ref >60)
GLUCOSE: 127 mg/dL — AB (ref 70–99)
POTASSIUM: 4 mmol/L (ref 3.5–5.1)
Sodium: 140 mmol/L (ref 135–145)

## 2018-07-15 LAB — GLUCOSE, CAPILLARY
GLUCOSE-CAPILLARY: 140 mg/dL — AB (ref 70–99)
Glucose-Capillary: 135 mg/dL — ABNORMAL HIGH (ref 70–99)

## 2018-07-15 SURGERY — ESOPHAGOGASTRODUODENOSCOPY (EGD) WITH PROPOFOL
Anesthesia: General

## 2018-07-15 MED ORDER — PROPOFOL 10 MG/ML IV BOLUS
INTRAVENOUS | Status: DC | PRN
  Administered 2018-07-15: 200 mg via INTRAVENOUS

## 2018-07-15 MED ORDER — PROPOFOL 500 MG/50ML IV EMUL
INTRAVENOUS | Status: AC
  Filled 2018-07-15: qty 50

## 2018-07-15 MED ORDER — GLUCAGON HCL RDNA (DIAGNOSTIC) 1 MG IJ SOLR
1.0000 mg | Freq: Once | INTRAMUSCULAR | Status: AC
  Administered 2018-07-15: 1 mg via INTRAVENOUS
  Filled 2018-07-15: qty 1

## 2018-07-15 MED ORDER — SUCCINYLCHOLINE CHLORIDE 20 MG/ML IJ SOLN
INTRAMUSCULAR | Status: DC | PRN
  Administered 2018-07-15: 100 mg via INTRAVENOUS

## 2018-07-15 MED ORDER — OXYCODONE HCL 5 MG PO TABS: 5.0000 mg | ORAL_TABLET | Freq: Once | ORAL | Status: DC | PRN

## 2018-07-15 MED ORDER — SODIUM CHLORIDE 0.9 % IV SOLN
INTRAVENOUS | Status: DC | PRN
  Administered 2018-07-15: 08:00:00 via INTRAVENOUS

## 2018-07-15 MED ORDER — FENTANYL CITRATE (PF) 100 MCG/2ML IJ SOLN
INTRAMUSCULAR | Status: AC
  Filled 2018-07-15: qty 2

## 2018-07-15 MED ORDER — FENTANYL CITRATE (PF) 100 MCG/2ML IJ SOLN
INTRAMUSCULAR | Status: DC | PRN
  Administered 2018-07-15: 50 ug via INTRAVENOUS

## 2018-07-15 MED ORDER — FENTANYL CITRATE (PF) 100 MCG/2ML IJ SOLN: 25.0000 ug | INTRAMUSCULAR | Status: DC | PRN

## 2018-07-15 MED ORDER — SUCCINYLCHOLINE CHLORIDE 20 MG/ML IJ SOLN
INTRAMUSCULAR | Status: AC
  Filled 2018-07-15: qty 1

## 2018-07-15 MED ORDER — ONDANSETRON HCL 4 MG/2ML IJ SOLN
INTRAMUSCULAR | Status: DC | PRN
  Administered 2018-07-15: 4 mg via INTRAVENOUS

## 2018-07-15 MED ORDER — OMEPRAZOLE 40 MG PO CPDR: 40.0000 mg | DELAYED_RELEASE_CAPSULE | Freq: Two times a day (BID) | ORAL | 0 refills | Status: DC

## 2018-07-15 MED ORDER — OXYCODONE HCL 5 MG/5ML PO SOLN: 5.0000 mg | Freq: Once | ORAL | Status: DC | PRN

## 2018-07-15 MED ORDER — ONDANSETRON HCL 4 MG/2ML IJ SOLN
INTRAMUSCULAR | Status: AC
  Filled 2018-07-15: qty 2

## 2018-07-15 MED ORDER — PHENYLEPHRINE HCL 10 MG/ML IJ SOLN
INTRAMUSCULAR | Status: AC
  Filled 2018-07-15: qty 1

## 2018-07-15 NOTE — Anesthesia Preprocedure Evaluation (Addendum)
Anesthesia Evaluation  Patient identified by MRN, date of birth, ID band Patient awake    Reviewed: Allergy & Precautions, H&P , NPO status , Patient's Chart, lab work & pertinent test results  Airway Mallampati: III  TM Distance: <3 FB Neck ROM: limited    Dental  (+) Chipped, Missing, Poor Dentition   Pulmonary neg pulmonary ROS, neg shortness of breath,           Cardiovascular Exercise Tolerance: Good hypertension, (-) angina(-) Past MI and (-) DOE      Neuro/Psych PSYCHIATRIC DISORDERS Anxiety Depression negative neurological ROS     GI/Hepatic negative GI ROS, Neg liver ROS,   Endo/Other  diabetes, Type 2Hypothyroidism   Renal/GU      Musculoskeletal  (+) Arthritis ,   Abdominal   Peds  Hematology negative hematology ROS (+)   Anesthesia Other Findings Food bolus  Past Medical History: No date: Depression No date: Diabetes mellitus without complication (HCC) No date: Hyperlipidemia No date: Hypertension No date: Unspecified hypothyroidism No date: Vitamin D deficiency  Past Surgical History: No date: FOOT SURGERY  BMI    Body Mass Index:  34.73 kg/m      Reproductive/Obstetrics negative OB ROS                             Anesthesia Physical Anesthesia Plan  ASA: IV and emergent  Anesthesia Plan: General ETT, Rapid Sequence and Cricoid Pressure   Post-op Pain Management:    Induction: Intravenous  PONV Risk Score and Plan: Ondansetron, Dexamethasone, Midazolam and Treatment may vary due to age or medical condition  Airway Management Planned: Oral ETT  Additional Equipment:   Intra-op Plan:   Post-operative Plan: Extubation in OR  Informed Consent: I have reviewed the patients History and Physical, chart, labs and discussed the procedure including the risks, benefits and alternatives for the proposed anesthesia with the patient or authorized representative  who has indicated his/her understanding and acceptance.   Dental Advisory Given  Plan Discussed with: Anesthesiologist, CRNA and Surgeon  Anesthesia Plan Comments: (Patient consented for risks of anesthesia including but not limited to:  - adverse reactions to medications - damage to teeth, lips or other oral mucosa - sore throat or hoarseness - Damage to heart, brain, lungs or loss of life  Patient voiced understanding.)       Anesthesia Quick Evaluation

## 2018-07-15 NOTE — Anesthesia Procedure Notes (Signed)
Procedure Name: Intubation Date/Time: 07/15/2018 8:25 AM Performed by: Omer JackWeatherly, Detrell Umscheid, CRNA Pre-anesthesia Checklist: Patient identified, Patient being monitored, Timeout performed, Emergency Drugs available and Suction available Patient Re-evaluated:Patient Re-evaluated prior to induction Oxygen Delivery Method: Circle system utilized Preoxygenation: Pre-oxygenation with 100% oxygen Induction Type: IV induction Ventilation: Mask ventilation without difficulty Laryngoscope Size: 4 and McGraph Grade View: Grade I Tube type: Oral Tube size: 7.0 mm Number of attempts: 1 Airway Equipment and Method: Stylet Placement Confirmation: ETT inserted through vocal cords under direct vision,  positive ETCO2 and breath sounds checked- equal and bilateral Secured at: 21 cm Tube secured with: Tape Dental Injury: Teeth and Oropharynx as per pre-operative assessment

## 2018-07-15 NOTE — Anesthesia Post-op Follow-up Note (Signed)
Anesthesia QCDR form completed.        

## 2018-07-15 NOTE — ED Notes (Signed)
Report given to penny RN in endo.

## 2018-07-15 NOTE — ED Triage Notes (Signed)
Patient to ED with feeling he has meat stuck in his throat. Patient is not drooling, able to speak in complete sentences and has no difficulty breathingl Patient has had this problem in the past. Edentulous and states it is hard to chew his food until he gets his new dentures.

## 2018-07-15 NOTE — Transfer of Care (Signed)
Immediate Anesthesia Transfer of Care Note  Patient: Timothy Edwards  Procedure(s) Performed: ESOPHAGOGASTRODUODENOSCOPY (EGD) WITH PROPOFOL (N/A )  Patient Location: PACU  Anesthesia Type:General  Level of Consciousness: awake, alert  and oriented  Airway & Oxygen Therapy: Patient Spontanous Breathing and Patient connected to face mask oxygen  Post-op Assessment: Report given to RN and Post -op Vital signs reviewed and stable  Post vital signs: Reviewed and stable  Last Vitals:  Vitals Value Taken Time  BP 137/90 07/15/2018  8:50 AM  Temp    Pulse 80 07/15/2018  8:53 AM  Resp 15 07/15/2018  8:53 AM  SpO2 100 % 07/15/2018  8:53 AM  Vitals shown include unvalidated device data.  Last Pain:  Vitals:   07/15/18 0749  TempSrc:   PainSc: 0-No pain         Complications: No apparent anesthesia complications

## 2018-07-15 NOTE — Consult Note (Signed)
Cephas Darby, MD [address]  [place], [place] [address]  Main: [phone]  Fax: [phone] Pager: [phone]   Consultation  Referring Provider:     No ref. provider found Primary Care Physician:  [name], MD Primary Gastroenterologist:    None     Reason for Consultation:     Food impaction  Date of Admission:  07/15/2018 Date of Consultation:  07/15/2018         HPI:   Jett Fukuda is a 72 y.o. male with metabolic syndrome had soup with meat balls around 2am, felt stuck, came to ER around 4AM. Pt has poor upper dentition, upper teeth are missing and reports he has difficulty chewing. Had similar episode about an year ago. He reports having acid reflux and was took off PPI several years ago. He was taking nexium before.    NSAIDs: none  Antiplts/Anticoagulants/Anti thrombotics: none  GI Procedures: EGD before  Past Medical History:  Diagnosis Date  . Depression   . Diabetes mellitus without complication ([place])   . Hyperlipidemia   . Hypertension   . Unspecified hypothyroidism   . Vitamin D deficiency     Past Surgical History:  Procedure Laterality Date  . FOOT SURGERY      Prior to Admission medications   Medication Sig Start Date End Date Taking? Authorizing Provider  atorvastatin (LIPITOR) 20 MG tablet TAKE 1 TABLET (20 MG TOTAL) BY MOUTH DAILY. 03/25/18   [name], MD  Blood Glucose Monitoring Suppl ([place]) W/DEVICE KIT To use to check blood sugars once daily 02/01/15   Jackolyn Confer, MD  buPROPion (WELLBUTRIN XL) 300 MG 24 hr tablet TAKE 1 TABLET (300 MG TOTAL) BY MOUTH DAILY. 05/27/18   [name], MD  cholecalciferol (VITAMIN D) 1000 UNITS tablet Take 1,000 Units by mouth daily.      [provider]  citalopram (CELEXA) 20 MG tablet TAKE 1 TABLET (20 MG TOTAL) BY MOUTH DAILY. 07/01/18   [name], MD  clobetasol ointment (TEMOVATE) 0.05 % Apply topically 2  (two) times daily. 07/11/18   [name], MD  glipiZIDE (GLUCOTROL) 5 MG tablet Take 1 tablet (5 mg total) by mouth daily before breakfast. Patient not taking: Reported on 07/15/2018 09/14/17   [name], MD  GLIPIZIDE XL 5 MG 24 hr tablet TAKE 1 TABLET (5 MG TOTAL) BY MOUTH DAILY WITH BREAKFAST. Patient taking differently: Take 5 mg by mouth daily with breakfast.  02/25/18   [name], MD  glucose blood test strip To use to check blood sugar once daily 02/01/15   Jackolyn Confer, MD  levothyroxine (SYNTHROID, LEVOTHROID) 50 MCG tablet TAKE 1 TABLET (50 MCG TOTAL) BY MOUTH DAILY. 07/01/18   [name], MD  lisinopril-hydrochlorothiazide (PRINZIDE,ZESTORETIC) 10-12.5 MG tablet TAKE 1 TABLET BY MOUTH DAILY. 07/01/18   [name], MD  meloxicam (MOBIC) 15 MG tablet TAKE 1 TABLET BY MOUTH DAILY AS NEEDED FOR PAIN 06/25/18   [name], MD  metFORMIN (GLUCOPHAGE) 500 MG tablet TAKE 1 TABLET BY MOUTH 2 TIMES DAILY WITH A MEAL. Patient taking differently: Take 500 mg by mouth 2 (two) times daily with a meal. TAKE 1 TABLET BY MOUTH 2 TIMES DAILY WITH A MEAL. 03/06/18   [name], MD  ONE TOUCH LANCETS MISC To use to check blood sugar once daily 02/01/15   Jackolyn Confer, MD  pantoprazole (PROTONIX) 40 MG tablet Take 1 tablet (40 mg total) by mouth 2 (two) times daily for 14 days. 11/14/17 03/31/18  Robinson, Patrick, MD  triamcinolone cream (KENALOG) 0.1 % Apply 1 application topically 2 (two) times daily. Patient not taking: Reported on 07/10/2018 01/07/18   Sonnenberg, Eric G, MD    Family History  Problem Relation Age of Onset  . Obesity Father   . Aneurysm Father   . Hypertension Father   . Gallbladder disease Mother      Social History   Tobacco Use  . Smoking status: Never Smoker  . Smokeless tobacco: Never Used  Substance Use Topics  . Alcohol use: Yes    Alcohol/week: 3.0 standard drinks    Types: 3 Glasses of wine per week  . Drug  use: Never    Allergies as of 07/15/2018  . (No Known Allergies)    Review of Systems:    All systems reviewed and negative except where noted in HPI.   Physical Exam:  Vital signs in last 24 hours: Temp:  [98.6 F (37 C)] 98.6 F (37 C) (08/12 0426) Pulse Rate:  [69-76] 69 (08/12 0749) Resp:  [18] 18 (08/12 0437) BP: (180-193)/(94-104) 187/95 (08/12 0749) SpO2:  [98 %] 98 % (08/12 0749) Weight:  [112.9 kg] 112.9 kg (08/12 0427)   General:   Pleasant, cooperative in NAD Head:  Normocephalic and atraumatic. Eyes:   No icterus.   Conjunctiva pink. PERRLA. Ears:  Normal auditory acuity. Neck:  Supple; no masses or thyroidomegaly Lungs: Respirations even and unlabored. Lungs clear to auscultation bilaterally.   No wheezes, crackles, or rhonchi.  Heart:  Regular rate and rhythm;  Without murmur, clicks, rubs or gallops Abdomen:  Soft, nondistended, nontender. Normal bowel sounds. No appreciable masses or hepatomegaly.  No rebound or guarding.  Rectal:  Not performed. Msk:  Symmetrical without gross deformities.  Strength normal  Extremities:  Without edema, cyanosis or clubbing. Neurologic:  Alert and oriented x3;  grossly normal neurologically. Skin:  Intact without significant lesions or rashes. Cervical Nodes:  No significant cervical adenopathy. Psych:  Alert and cooperative. Normal affect.  LAB RESULTS: CBC Latest Ref Rng & Units 07/15/2018 11/14/2017  WBC 3.8 - 10.6 K/uL 8.8 7.0  Hemoglobin 13.0 - 18.0 g/dL 15.6 14.5  Hematocrit 40.0 - 52.0 % 44.0 42.3  Platelets 150 - 440 K/uL 258 224    BMET BMP Latest Ref Rng & Units 07/15/2018 07/10/2018 11/14/2017  Glucose 70 - 99 mg/dL 127(H) 108(H) 111(H)  BUN 8 - 23 mg/dL 16 16 12  Creatinine 0.61 - 1.24 mg/dL 0.77 0.88 0.74  Sodium 135 - 145 mmol/L 140 140 138  Potassium 3.5 - 5.1 mmol/L 4.0 4.3 4.0  Chloride 98 - 111 mmol/L 104 102 102  CO2 22 - 32 mmol/L 26 31 24  Calcium 8.9 - 10.3 mg/dL 9.7 9.6 9.4    LFT Hepatic  Function Latest Ref Rng & Units 07/10/2018 09/11/2017 03/30/2017  Total Protein 6.0 - 8.3 g/dL 6.6 7.4 6.8  Albumin 3.5 - 5.2 g/dL 4.1 4.5 4.2  AST 0 - 37 U/L 30 64(H) 27  ALT 0 - 53 U/L 31 73(H) 29  Alk Phosphatase 39 - 117 U/L 51 74 61  Total Bilirubin 0.2 - 1.2 mg/dL 0.7 0.9 0.6     STUDIES: No results found.    Impression / Plan:   Zaidan Roger Mamone is a 71 y.o. male with metabolic syndrome, chronic GERD, presented to ER with food   impaction after eating meat balls   - Urgent EGD  I have discussed alternative options, risks & benefits,  which include, but are not limited to, bleeding, infection, perforation,respiratory complication & drug reaction.  The patient agrees with this plan & written consent will be obtained.    Thank you for involving me in the care of this patient.      LOS: 0 days   Rohini Vanga, MD  07/15/2018, 8:18 AM   Note: This dictation was prepared with Dragon dictation along with smaller phrase technology. Any transcriptional errors that result from this process are unintentional. 

## 2018-07-15 NOTE — Addendum Note (Signed)
Addendum  created 07/15/18 0943 by Piscitello, Cleda MccreedyJoseph K, MD   Sign clinical note

## 2018-07-15 NOTE — ED Notes (Signed)
Pt states he was eating some meatballs for dinner last night when he felt like he got some meat stuck in his throat. Pt states he is waiting his new dentures to be made because he doesn't have many teeth left so he couldn't chew the meat properly. This has happened before in the past. Pt states no pain just discomfort in throat.

## 2018-07-15 NOTE — ED Provider Notes (Signed)
[facility] Emergency Department Provider Note   ____________________________________________   First MD Initiated Contact with Patient 07/15/18 0505     (approximate)  I have reviewed the triage vital signs and the nursing notes.   HISTORY  Chief Complaint Foreign Body (throat)    HPI Timothy Edwards is a 72 y.o. male who comes into the hospital today stating that there is some food stuck in his throat.  He reports that a similar episode occurred last December.  He was given a shot of medicine which helped to loosen his esophagus and then he was able to swallow.  He reports that he does not have many teeth and when he eats he eats too fast.  He reports that he was eating meatballs and one feels like it stuck in his throat.  The patient is able to swallow his saliva but he states that he cannot swallow anything else.  He states his blood pressure is elevated but he has not taken his blood pressure medicine since Saturday morning.  He is here today for evaluation and treatment of his symptoms.   Past Medical History:  Diagnosis Date  . Depression   . Diabetes mellitus without complication ([place])   . Hyperlipidemia   . Hypertension   . Unspecified hypothyroidism   . Vitamin D deficiency     Patient Active Problem List   Diagnosis Date Noted  . Dermatitis 01/07/2018  . Impacted esophageal foreign body 01/07/2018  . Neck stiffness 12/29/2016  . Left lateral knee pain 05/11/2016  . Depression 04/27/2015  . Arthritis 01/26/2015  . Elevated LFTs 07/27/2014  . Memory loss 05/05/2013  . Diabetes mellitus type 2 in obese ([place]) 03/18/2013  . Vitamin D deficiency   . Hypertension 10/11/2011  . Hyperlipidemia 10/11/2011  . Hypothyroidism 10/11/2011  . Generalized anxiety disorder 10/11/2011  . Allergic rhinitis 10/11/2011  . Obesity (BMI 30-39.9) 10/11/2011    Past Surgical History:  Procedure Laterality Date  . FOOT SURGERY      Prior to  Admission medications   Medication Sig Start Date End Date Taking? Authorizing Provider  atorvastatin (LIPITOR) 20 MG tablet TAKE 1 TABLET (20 MG TOTAL) BY MOUTH DAILY. 03/25/18   [name], MD  Blood Glucose Monitoring Suppl (Coco) W/DEVICE KIT To use to check blood sugars once daily 02/01/15   Jackolyn Confer, MD  buPROPion (WELLBUTRIN XL) 300 MG 24 hr tablet TAKE 1 TABLET (300 MG TOTAL) BY MOUTH DAILY. 05/27/18   [name], MD  cholecalciferol (VITAMIN D) 1000 UNITS tablet Take 1,000 Units by mouth daily.      [provider]  citalopram (CELEXA) 20 MG tablet TAKE 1 TABLET (20 MG TOTAL) BY MOUTH DAILY. 07/01/18   [name], MD  clobetasol ointment (TEMOVATE) 0.05 % Apply topically 2 (two) times daily. 07/11/18   [name], MD  glipiZIDE (GLUCOTROL) 5 MG tablet Take 1 tablet (5 mg total) by mouth daily before breakfast. Patient not taking: Reported on 07/15/2018 09/14/17   [name], MD  GLIPIZIDE XL 5 MG 24 hr tablet TAKE 1 TABLET (5 MG TOTAL) BY MOUTH DAILY WITH BREAKFAST. Patient taking differently: Take 5 mg by mouth daily with breakfast.  02/25/18   [name], MD  glucose blood test strip To use to check blood sugar once daily 02/01/15   Jackolyn Confer, MD  levothyroxine (SYNTHROID, LEVOTHROID) 50 MCG tablet TAKE 1 TABLET (50 MCG TOTAL)  BY MOUTH DAILY. 07/01/18   [name], MD  lisinopril-hydrochlorothiazide (PRINZIDE,ZESTORETIC) 10-12.5 MG tablet TAKE 1 TABLET BY MOUTH DAILY. 07/01/18   [name], MD  meloxicam (MOBIC) 15 MG tablet TAKE 1 TABLET BY MOUTH DAILY AS NEEDED FOR PAIN 06/25/18   [name], MD  metFORMIN (GLUCOPHAGE) 500 MG tablet TAKE 1 TABLET BY MOUTH 2 TIMES DAILY WITH A MEAL. Patient taking differently: Take 500 mg by mouth 2 (two) times daily with a meal. TAKE 1 TABLET BY MOUTH 2 TIMES DAILY WITH A MEAL. 03/06/18   [name], MD  ONE TOUCH LANCETS MISC To use to check  blood sugar once daily 02/01/15   Jackolyn Confer, MD  pantoprazole (PROTONIX) 40 MG tablet Take 1 tablet (40 mg total) by mouth 2 (two) times daily for 14 days. 11/14/17 03/31/18  Merlyn Lot, MD  triamcinolone cream (KENALOG) 0.1 % Apply 1 application topically 2 (two) times daily. Patient not taking: Reported on 07/10/2018 01/07/18   [name], MD    Allergies Patient has no known allergies.  Family History  Problem Relation Age of Onset  . Obesity Father   . Aneurysm Father   . Hypertension Father   . Gallbladder disease Mother     Social History Social History   Tobacco Use  . Smoking status: Never Smoker  . Smokeless tobacco: Never Used  Substance Use Topics  . Alcohol use: Yes    Alcohol/week: 3.0 standard drinks    Types: 3 Glasses of wine per week  . Drug use: Never    Review of Systems  Constitutional: No fever/chills Eyes: No visual changes. ENT: Esophageal foreign body Cardiovascular: Denies chest pain. Respiratory: Denies shortness of breath. Gastrointestinal: No abdominal pain.  No nausea, no vomiting.  No diarrhea.  No constipation. Genitourinary: Negative for dysuria. Musculoskeletal: Negative for back pain. Skin: Negative for rash. Neurological: Negative for headaches, focal weakness or numbness.   ____________________________________________   PHYSICAL EXAM:  VITAL SIGNS: ED Triage Vitals  Enc Vitals Group     BP 07/15/18 0426 (!) 180/94     Pulse Rate 07/15/18 0426 73     Resp 07/15/18 0426 18     Temp 07/15/18 0426 98.6 F (37 C)     Temp Source 07/15/18 0426 Oral     SpO2 07/15/18 0426 98 %     Weight 07/15/18 0427 249 lb (112.9 kg)     Height 07/15/18 0427 5' 11"  (1.803 m)     Head Circumference --      Peak Flow --      Pain Score 07/15/18 0427 0     Pain Loc --      Pain Edu? --      Excl. in [place]? --     Constitutional: Alert and oriented. Well appearing and in mild distress. Eyes: Conjunctivae are normal. PERRL.  EOMI. Head: Atraumatic. Nose: No congestion/rhinnorhea. Mouth/Throat: Mucous membranes are moist.  Oropharynx non-erythematous. Cardiovascular: Normal rate, regular rhythm. Grossly normal heart sounds.  Good peripheral circulation. Respiratory: Normal respiratory effort.  No retractions. Lungs CTAB. Gastrointestinal: Soft and nontender. No distention.  Positive bowel sounds Musculoskeletal: No lower extremity tenderness nor edema.   Neurologic:  Normal speech and language.  Skin:  Skin is warm, dry and intact.  Psychiatric: Mood and affect are normal.   ____________________________________________   LABS (all labs ordered are listed, but only abnormal results are displayed)  Labs Reviewed  BASIC METABOLIC PANEL - Abnormal; Notable  for the following components:      Result Value   Glucose, Bld 127 (*)    All other components within normal limits  CBC   ____________________________________________  EKG  none ____________________________________________  RADIOLOGY  ED MD interpretation:  none  Official radiology report(s): No results found.  ____________________________________________   PROCEDURES  Procedure(s) performed: None  Procedures  Critical Care performed: No  ____________________________________________   INITIAL IMPRESSION / ASSESSMENT AND PLAN / ED COURSE  As part of my medical decision making, I reviewed the following data within the electronic MEDICAL RECORD NUMBER Notes from prior ED visits and [place] Controlled Substance Database   This is a 72 year old male who comes into the hospital today stating that there is some food stuck in his throat.  The patient was eating some meatballs and states that it did not go all the way down.  He has had a similar episode a few months ago.  I did give the patient a dose of glucagon 1 mg IV to see if that would help resolve his symptoms and it did not.  I also had the patient attempt to drink some Coca-Cola and he  vomited back up.  We did check some blood work to include a CBC and a BMP which were unremarkable.  I contacted Dr. Alice Reichert the gastroenterologist on-call.  He reports that they will take the patient to the endoscopy suite this morning.  The patient has no complaints at this time and will await gastroenterology intervention.      ____________________________________________   FINAL CLINICAL IMPRESSION(S) / ED DIAGNOSES  Final diagnoses:  Esophageal obstruction due to food impaction     ED Discharge Orders    None       Note:  This document was prepared using Dragon voice recognition software and may include unintentional dictation errors.    Loney Hering, MD 07/15/18 [phone]

## 2018-07-15 NOTE — Anesthesia Postprocedure Evaluation (Addendum)
Anesthesia Post Note  Patient: Oren BracketCharles Roger Harpenau  Procedure(s) Performed: ESOPHAGOGASTRODUODENOSCOPY (EGD) WITH PROPOFOL (N/A )  Patient location during evaluation: PACU Anesthesia Type: General Level of consciousness: awake and alert Pain management: pain level controlled Vital Signs Assessment: post-procedure vital signs reviewed and stable Respiratory status: spontaneous breathing, nonlabored ventilation, respiratory function stable and patient connected to nasal cannula oxygen Cardiovascular status: blood pressure returned to baseline and stable Postop Assessment: no apparent nausea or vomiting Anesthetic complications: no     Last Vitals:  Vitals:   07/15/18 0905 07/15/18 0915  BP: (!) 149/76 (!) 142/76  Pulse: 77 75  Resp: 18 16  Temp:  36.9 C  SpO2: 95% 98%    Last Pain:  Vitals:   07/15/18 0915  TempSrc:   PainSc: 0-No pain                 Cleda MccreedyJoseph K Byrant Valent

## 2018-07-15 NOTE — ED Notes (Addendum)
Gave pt Coke to drink, pt vomited beverage back up. Pt stated that he could still feel the food in his throat. This RN made Dr. Zenda AlpersWebster aware.

## 2018-07-15 NOTE — Op Note (Signed)
[facility] Patient Name: Timothy Edwards Procedure Date: 07/15/2018 7:57 AM MRN: [id number] Account #: [id number] Date of Birth: 1946-02-11 Admit Type: Outpatient Age: 72 Room: [facility] ENDO ROOM 2 Gender: Male Note Status: Finalized Procedure:            Upper GI endoscopy Indications:          Foreign body in the esophagus Providers:            Toney Reil MD, MD Referring MD:         Yehuda Mao. Birdie Sons (Referring MD) Medicines:            General Anesthesia Complications:        No immediate complications. Estimated blood loss:                        Minimal. Procedure:            Pre-Anesthesia Assessment:                       - Prior to the procedure, a History and Physical was                        performed, and patient medications and allergies were                        reviewed. The patient is competent. The risks and                        benefits of the procedure and the sedation options and                        risks were discussed with the patient. All questions                        were answered and informed consent was obtained.                        Patient identification and proposed procedure were                        verified by the physician, the nurse, the                        anesthesiologist, the anesthetist and the technician in                        the pre-procedure area in the procedure room in the                        endoscopy suite. Mental Status Examination: alert and                        oriented. Airway Examination: normal oropharyngeal                        airway and neck mobility. Respiratory Examination:                        clear to auscultation. CV Examination: normal.  Prophylactic Antibiotics: The patient does not require                        prophylactic antibiotics. Prior Anticoagulants: The                        patient has taken aspirin, last dose was 2  days prior                        to procedure. ASA Grade Assessment: III - A patient                        with severe systemic disease. After reviewing the risks                        and benefits, the patient was deemed in satisfactory                        condition to undergo the procedure. The anesthesia plan                        was to use general anesthesia. Immediately prior to                        administration of medications, the patient was                        re-assessed for adequacy to receive sedatives. The                        heart rate, respiratory rate, oxygen saturations, blood                        pressure, adequacy of pulmonary ventilation, and                        response to care were monitored throughout the                        procedure. The physical status of the patient was                        re-assessed after the procedure.                       After obtaining informed consent, the endoscope was                        passed under direct vision. Throughout the procedure,                        the patient's blood pressure, pulse, and oxygen                        saturations were monitored continuously. The Endoscope                        was introduced through the mouth, and advanced to the  second part of duodenum. The upper GI endoscopy was                        accomplished without difficulty. The patient tolerated                        the procedure fairly well. Findings:      LA Grade C (one or more mucosal breaks continuous between tops of 2 or       more mucosal folds, less than 75% circumference) esophagitis with       bleeding was found at the gastroesophageal junction.      Food bolus spontaneously passed from the esophagus      A 1 cm hiatal hernia was present.      Diffuse mildly erythematous mucosa without bleeding was found in the       gastric body.      A non-obstructing Schatzki ring was  found at the gastroesophageal       junction.      Localized mild mucosal changes characterized by nodularity were found at       the gastroesophageal junction. Biopsies were taken with a cold forceps       for histology.      Food (residue) was found in the duodenal bulb.      The second portion of the duodenum was normal. Impression:           - LA Grade C erosive esophagitis.                       - 1 cm hiatal hernia.                       - Erythematous mucosa in the gastric body.                       - Non-obstructing Schatzki ring.                       - Nodular mucosa in the esophagus. Biopsied.                       - Retained food in the duodenum. Recommendation:       - Await pathology results.                       - Repeat upper endoscopy in 1 month to check healing,                        to evaluate the response to therapy and for                        surveillance.                       - Use Prilosec (omeprazole) 40 mg PO BID for 3 months.                       - Chopped diet and diabetic (ADA) diet.                       - Continue present medications. Procedure Code(s):    --- Professional ---  [id number], Esophagogastroduodenoscopy, flexible, transoral;                        with biopsy, single or multiple Diagnosis Code(s):    --- Professional ---                       K20.8, Other esophagitis                       K44.9, Diaphragmatic hernia without obstruction or                        gangrene                       K31.89, Other diseases of stomach and duodenum                       K22.8, Other specified diseases of esophagus                       K22.2, Esophageal obstruction                       T18.108A, Unspecified foreign body in esophagus causing                        other injury, initial encounter CPT copyright 2017 American Medical Association. All rights reserved. The codes documented in this report are preliminary and upon  coder review may  be revised to meet current compliance requirements. Dr. Libby Mawonini Vanga Toney Reilohini Reddy Vanga MD, MD 07/15/2018 8:43:23 AM This report has been signed electronically. Number of Addenda: 0 Note Initiated On: 07/15/2018 7:57 AM      [facility]

## 2018-07-16 ENCOUNTER — Encounter: Payer: Self-pay | Admitting: Gastroenterology

## 2018-07-16 LAB — SURGICAL PATHOLOGY

## 2018-07-24 ENCOUNTER — Encounter: Payer: Self-pay | Admitting: Gastroenterology

## 2018-07-29 ENCOUNTER — Other Ambulatory Visit: Payer: Self-pay

## 2018-07-29 DIAGNOSIS — K222 Esophageal obstruction: Secondary | ICD-10-CM

## 2018-08-06 ENCOUNTER — Telehealth: Payer: Self-pay | Admitting: Gastroenterology

## 2018-08-06 NOTE — Telephone Encounter (Signed)
Returned pt call and VM has not not yet been set up will call again tomorrow

## 2018-08-06 NOTE — Telephone Encounter (Signed)
Patient called and left vm on 8.30.19 @2 :44PM requesting a call from the nurse to discuss procedure scheduled for 9.12.19. Please return call and document.

## 2018-08-08 ENCOUNTER — Telehealth: Payer: Self-pay | Admitting: Gastroenterology

## 2018-08-08 NOTE — Telephone Encounter (Signed)
Pt is calling to r/s his Procedure for 08/15/18 he is not sure when to r/s to yet he will call us back

## 2018-08-08 NOTE — Telephone Encounter (Signed)
Patients colonoscopy has been canceled per his request.

## 2018-08-15 ENCOUNTER — Encounter: Admission: RE | Payer: Self-pay | Source: Ambulatory Visit

## 2018-08-15 ENCOUNTER — Ambulatory Visit: Admission: RE | Admit: 2018-08-15 | Payer: 59 | Source: Ambulatory Visit | Admitting: Gastroenterology

## 2018-08-15 SURGERY — ESOPHAGOGASTRODUODENOSCOPY (EGD) WITH PROPOFOL
Anesthesia: General

## 2018-08-28 ENCOUNTER — Other Ambulatory Visit: Payer: Self-pay | Admitting: Family Medicine

## 2018-09-23 ENCOUNTER — Other Ambulatory Visit: Payer: Self-pay | Admitting: Family Medicine

## 2018-09-30 ENCOUNTER — Other Ambulatory Visit: Payer: Self-pay | Admitting: Family Medicine

## 2018-10-28 ENCOUNTER — Other Ambulatory Visit: Payer: Self-pay | Admitting: Family Medicine

## 2018-10-28 NOTE — Telephone Encounter (Signed)
Copied from CRM [id number]. Topic: Quick Communication - Rx Refill/Question >> Oct 28, 2018  3:42 PM Zada GirtLander, [place] wrote: Medication: omeprazole (PRILOSEC) 40 MG capsule (was prescribed by other provider not PCP) Has the patient contacted their pharmacy? Yes.   (Agent: If no, request that the patient contact the pharmacy for the refill.) (Agent: If yes, when and what did the pharmacy advise?)  Preferred Pharmacy (with phone number or street name): [facility] [place] [address] [place] [address] Phone: [phone] Fax: [phone]  Agent: Please be advised that RX refills may take up to 3 business days. We ask that you follow-up with your pharmacy.

## 2018-10-30 ENCOUNTER — Other Ambulatory Visit: Payer: Self-pay | Admitting: Family Medicine

## 2018-10-30 NOTE — Telephone Encounter (Signed)
Last OV 07/10/2018   Last refilled 06/25/2018 disp 30 with 3 refills   Sent to PCP for approval

## 2018-11-02 ENCOUNTER — Other Ambulatory Visit: Payer: Self-pay | Admitting: Gastroenterology

## 2018-11-05 MED ORDER — OMEPRAZOLE 40 MG PO CPDR: 40.0000 mg | DELAYED_RELEASE_CAPSULE | Freq: Two times a day (BID) | ORAL | 0 refills | Status: DC

## 2018-11-05 NOTE — Telephone Encounter (Signed)
Pt requesting refill. Please ok.

## 2018-11-11 ENCOUNTER — Ambulatory Visit (INDEPENDENT_AMBULATORY_CARE_PROVIDER_SITE_OTHER): Payer: 59 | Admitting: Family Medicine

## 2018-11-11 ENCOUNTER — Encounter: Payer: Self-pay | Admitting: Family Medicine

## 2018-11-11 VITALS — BP 150/80 | HR 72 | Temp 97.7°F | Ht 71.0 in | Wt 249.4 lb

## 2018-11-11 DIAGNOSIS — E669 Obesity, unspecified: Secondary | ICD-10-CM

## 2018-11-11 DIAGNOSIS — Z125 Encounter for screening for malignant neoplasm of prostate: Secondary | ICD-10-CM | POA: Diagnosis not present

## 2018-11-11 DIAGNOSIS — E1169 Type 2 diabetes mellitus with other specified complication: Secondary | ICD-10-CM

## 2018-11-11 DIAGNOSIS — R350 Frequency of micturition: Secondary | ICD-10-CM | POA: Diagnosis not present

## 2018-11-11 DIAGNOSIS — F330 Major depressive disorder, recurrent, mild: Secondary | ICD-10-CM | POA: Diagnosis not present

## 2018-11-11 DIAGNOSIS — I10 Essential (primary) hypertension: Secondary | ICD-10-CM

## 2018-11-11 DIAGNOSIS — N400 Enlarged prostate without lower urinary tract symptoms: Secondary | ICD-10-CM | POA: Insufficient documentation

## 2018-11-11 LAB — POCT URINALYSIS DIPSTICK
BILIRUBIN UA: NEGATIVE
Blood, UA: NEGATIVE
Glucose, UA: NEGATIVE
Ketones, UA: NEGATIVE
Leukocytes, UA: NEGATIVE
Nitrite, UA: NEGATIVE
Protein, UA: NEGATIVE
Spec Grav, UA: 1.015 (ref 1.010–1.025)
Urobilinogen, UA: 0.2 E.U./dL
pH, UA: 7 (ref 5.0–8.0)

## 2018-11-11 LAB — PSA: PSA: 0.07 ng/mL — ABNORMAL LOW (ref 0.10–4.00)

## 2018-11-11 LAB — BASIC METABOLIC PANEL
BUN: 18 mg/dL (ref 6–23)
CALCIUM: 9.6 mg/dL (ref 8.4–10.5)
CO2: 26 meq/L (ref 19–32)
Chloride: 101 mEq/L (ref 96–112)
Creatinine, Ser: 0.77 mg/dL (ref 0.40–1.50)
GFR: 104.9 mL/min (ref >60.00)
Glucose, Bld: 122 mg/dL — ABNORMAL HIGH (ref 70–99)
Potassium: 4.2 mEq/L (ref 3.5–5.1)
Sodium: 136 mEq/L (ref 135–145)

## 2018-11-11 LAB — HEMOGLOBIN A1C: Hgb A1c MFr Bld: 6.5 % (ref 4.6–6.5)

## 2018-11-11 NOTE — Assessment & Plan Note (Signed)
I suspect this is related to BPH.  He declined rectal exam today.  We will check a urinalysis and PSA and then consider treatment with Flomax.

## 2018-11-11 NOTE — Assessment & Plan Note (Signed)
Well-controlled at home.  He will continue to monitor.  If it trends up over 140/90 he will let us know.  Continue current regimen.  Check BMP.

## 2018-11-11 NOTE — Patient Instructions (Signed)
Nice to see you. Please try to see your eye doctor for your yearly exam. Please monitor your blood pressure.  If it trends up over 140/90 at home please let us know. We will check labs today and contact you with the results.  We will then consider whether or not to treat you with Flomax for your likely prostate issue. If you develop thoughts of harming yourself please seek medical attention in the emergency department.

## 2018-11-11 NOTE — Progress Notes (Signed)
  Marikay AlarEric Annalee Meyerhoff, MD Phone: [phone]  Nelda MarseilleCharles Roger Andrey CampanileWilson is a 72 y.o. male who presents today for follow-up.  CC: Diabetes, hypertension, depression/anxiety, urinary frequency  Diabetes: Typically low 100s fasting.  Up to 130s in the afternoon.  Taking metformin and glipizide.  Some polyuria though no polydipsia.  No hypoglycemia.  He has not seen ophthalmology.  Hypertension: Typically 130s over 80s.  Taking lisinopril/HCTZ.  No chest pain, shortness of breath, or edema.  Depression/anxiety: No anxiety.  He notes some depression as his dog recently passed away.  He notes overall he is doing well though.  He is taking Celexa and Wellbutrin.  He denies SI.  He does note a history of hospitalization for his depression when he was young with him receiving ECT.  Urinary frequency: Patient notes he is peeing more frequently than typical.  He feels like he is not emptying his bladder as he should at times and then he will dribble.  No straining.  Notes his stream is not as free-flowing.  Nocturia 2 times per night though he does drink tea in the evening.  No urgency or dysuria.  Social History   Tobacco Use  Smoking Status Never Smoker  Smokeless Tobacco Never Used     ROS see history of present illness  Objective  Physical Exam Vitals:   11/11/18 0905 11/11/18 0932  BP: (!) 160/80 (!) 150/80  Pulse: 72   Temp: 97.7 F (36.5 C)   SpO2: 97%     BP Readings from Last 3 Encounters:  11/11/18 (!) 150/80  07/15/18 (!) 142/76  07/10/18 (!) 142/86   Wt Readings from Last 3 Encounters:  11/11/18 249 lb 6.4 oz (113.1 kg)  07/15/18 249 lb (112.9 kg)  07/10/18 249 lb 12.8 oz (113.3 kg)    Physical Exam  Constitutional: No distress.  Cardiovascular: Normal rate, regular rhythm and normal heart sounds.  Pulmonary/Chest: Effort normal and breath sounds normal.  Genitourinary:  Genitourinary Comments: Patient declined rectal exam  Musculoskeletal: He exhibits no edema.    Neurological: He is alert.  Skin: Skin is warm and dry. He is not diaphoretic.     Assessment/Plan: Please see individual problem list.  Diabetes mellitus type 2 in obese Seems to be well controlled at home.  Continue current regimen.  Hypertension Well-controlled at home.  He will continue to monitor.  If it trends up over 140/90 he will let us know.  Continue current regimen.  Check BMP.  Depression Seems to be well controlled currently.  He will continue his current regimen.  Discussed return precautions.  Urinary frequency I suspect this is related to BPH.  He declined rectal exam today.  We will check a urinalysis and PSA and then consider treatment with Flomax. IPSS score 8-0  Health Maintenance: Encouraged to see ophthalmology.  Orders Placed This Encounter  Procedures  . PSA  . HgB A1c  . Basic Metabolic Panel (BMET)  . POCT Urinalysis Dipstick    No orders of the defined types were placed in this encounter.    Marikay AlarEric Taziah Difatta, MD [facility] [place];

## 2018-11-11 NOTE — Assessment & Plan Note (Signed)
Seems to be well controlled currently.  He will continue his current regimen.  Discussed return precautions.

## 2018-11-11 NOTE — Assessment & Plan Note (Signed)
Seems to be well controlled at home.  Continue current regimen.

## 2018-11-14 ENCOUNTER — Other Ambulatory Visit: Payer: Self-pay | Admitting: Family Medicine

## 2018-11-14 MED ORDER — TAMSULOSIN HCL 0.4 MG PO CAPS: 0.4000 mg | ORAL_CAPSULE | Freq: Every day | ORAL | 3 refills | Status: DC

## 2018-12-31 ENCOUNTER — Other Ambulatory Visit: Payer: Self-pay | Admitting: Family Medicine

## 2019-02-03 ENCOUNTER — Other Ambulatory Visit: Payer: Self-pay | Admitting: Gastroenterology

## 2019-02-03 ENCOUNTER — Other Ambulatory Visit: Payer: Self-pay

## 2019-02-03 ENCOUNTER — Other Ambulatory Visit: Payer: Self-pay | Admitting: Family Medicine

## 2019-02-03 MED ORDER — OMEPRAZOLE 40 MG PO CPDR: 40.0000 mg | DELAYED_RELEASE_CAPSULE | Freq: Two times a day (BID) | ORAL | 0 refills | Status: DC

## 2019-02-24 ENCOUNTER — Other Ambulatory Visit: Payer: Self-pay | Admitting: Family Medicine

## 2019-03-06 ENCOUNTER — Other Ambulatory Visit: Payer: Self-pay | Admitting: Family Medicine

## 2019-03-17 ENCOUNTER — Telehealth: Payer: Self-pay

## 2019-03-17 NOTE — Telephone Encounter (Signed)
Copied from CRM [id number]. Topic: Appointment Scheduling - Scheduling Inquiry for Clinic >> Mar 14, 2019 11:38 AM Lynne Logan D wrote: Reason for CRM: Pt returned Timothy Edwards's vm regarding OV next week. Please advise.

## 2019-03-19 ENCOUNTER — Encounter: Payer: Self-pay | Admitting: Family Medicine

## 2019-03-19 ENCOUNTER — Ambulatory Visit (INDEPENDENT_AMBULATORY_CARE_PROVIDER_SITE_OTHER): Payer: No Typology Code available for payment source | Admitting: Family Medicine

## 2019-03-19 ENCOUNTER — Other Ambulatory Visit: Payer: Self-pay

## 2019-03-19 DIAGNOSIS — E039 Hypothyroidism, unspecified: Secondary | ICD-10-CM | POA: Diagnosis not present

## 2019-03-19 DIAGNOSIS — N400 Enlarged prostate without lower urinary tract symptoms: Secondary | ICD-10-CM

## 2019-03-19 DIAGNOSIS — J309 Allergic rhinitis, unspecified: Secondary | ICD-10-CM

## 2019-03-19 DIAGNOSIS — I10 Essential (primary) hypertension: Secondary | ICD-10-CM

## 2019-03-19 DIAGNOSIS — E785 Hyperlipidemia, unspecified: Secondary | ICD-10-CM

## 2019-03-19 DIAGNOSIS — E669 Obesity, unspecified: Secondary | ICD-10-CM

## 2019-03-19 DIAGNOSIS — E1169 Type 2 diabetes mellitus with other specified complication: Secondary | ICD-10-CM

## 2019-03-19 MED ORDER — FLUTICASONE PROPIONATE 50 MCG/ACT NA SUSP: 2.0000 | Freq: Every day | NASAL | 6 refills | Status: DC

## 2019-03-19 NOTE — Progress Notes (Signed)
Virtual Visit via telephone Note  This visit type was conducted due to national recommendations for restrictions regarding the COVID-19 pandemic (e.g. social distancing).  This format is felt to be most appropriate for this patient at this time.  All issues noted in this document were discussed and addressed.  No physical exam was performed (except for noted visual exam findings with Video Visits).   I connected with Timothy Edwards on 03/23/19 at 11:00 AM EDT by telephone and verified that I am speaking with the correct person using two identifiers. Location patient: home Location provider: work  Persons participating in the virtual visit: patient, provider  I discussed the limitations, risks, security and privacy concerns of performing an evaluation and management service by telephone and the availability of in person appointments. I also discussed with the patient that there may be a patient responsible charge related to this service. The patient expressed understanding and agreed to proceed.  Interactive audio and video telecommunications were attempted between this provider and patient, however failed, due to patient having technical difficulties OR patient did not have access to video capability.  We continued and completed visit with audio only.  Reason for visit: follow-up  HPI: Diabetes: Typically in the low 100s though did go up to 152 after he had a glass of wine.  He takes metformin and glipizide.  He does note some polyuria though that is improved.  This only occurs if he increases his fluid intake at night.  He does note some hypoglycemia if he misses his breakfast and takes his glipizide.  He is due to see ophthalmology.  Hypertension: Typically 120s over 80.  Taking lisinopril and HCTZ.  No chest pain or shortness of breath.  He does note a sock indentation at times though no significant edema.  No orthopnea or PND.  BPH: Flomax has helped significantly.  He has quite a bit better  flow.  Allergic rhinitis: Patient notes he typically has cough when the pollen comes out.  He has had some cough this year.  Mild sinus congestion and lots of sneezing.  Minimal postnasal drip.  Blows mucus out of his nose at times.  No fevers.  No travel.  No coronavirus exposure.  He does not take any medication for this.   ROS: See pertinent positives and negatives per HPI.  Past Medical History:  Diagnosis Date  . Depression   . Diabetes mellitus without complication ([place])   . Hyperlipidemia   . Hypertension   . Unspecified hypothyroidism   . Vitamin D deficiency     Past Surgical History:  Procedure Laterality Date  . ESOPHAGOGASTRODUODENOSCOPY (EGD) WITH PROPOFOL N/A 07/15/2018   Procedure: ESOPHAGOGASTRODUODENOSCOPY (EGD) WITH PROPOFOL;  Surgeon: Lin Landsman, MD;  Location: [place];  Service: Gastroenterology;  Laterality: N/A;  . FOOT SURGERY      Family History  Problem Relation Age of Onset  . Obesity Father   . Aneurysm Father   . Hypertension Father   . Gallbladder disease Mother     SOCIAL HX: Non-smoker.   Current Outpatient Medications:  .  atorvastatin (LIPITOR) 20 MG tablet, TAKE 1 TABLET BY MOUTH DAILY., Disp: 90 tablet, Rfl: 1 .  Blood Glucose Monitoring Suppl (ONE TOUCH BASIC SYSTEM) W/DEVICE KIT, To use to check blood sugars once daily, Disp: 1 each, Rfl: 0 .  buPROPion (WELLBUTRIN XL) 300 MG 24 hr tablet, TAKE 1 TABLET (300 MG TOTAL) BY MOUTH DAILY., Disp: 30 tablet, Rfl: 3 .  cholecalciferol (VITAMIN D)  1000 UNITS tablet, Take 1,000 Units by mouth daily.  , Disp: , Rfl:  .  citalopram (CELEXA) 20 MG tablet, TAKE 1 TABLET (20 MG TOTAL) BY MOUTH DAILY., Disp: 90 tablet, Rfl: 1 .  clobetasol ointment (TEMOVATE) 0.05 %, Apply topically 2 (two) times daily., Disp: 30 g, Rfl: 0 .  GLIPIZIDE XL 5 MG 24 hr tablet, TAKE 1 TABLET (5 MG TOTAL) BY MOUTH DAILY WITH BREAKFAST., Disp: 90 tablet, Rfl: 1 .  glucose blood test strip, To use to check blood  sugar once daily, Disp: 100 each, Rfl: 12 .  levothyroxine (SYNTHROID, LEVOTHROID) 50 MCG tablet, TAKE 1 TABLET (50 MCG TOTAL) BY MOUTH DAILY., Disp: 90 tablet, Rfl: 1 .  lisinopril-hydrochlorothiazide (PRINZIDE,ZESTORETIC) 10-12.5 MG tablet, TAKE 1 TABLET BY MOUTH DAILY., Disp: 90 tablet, Rfl: 1 .  meloxicam (MOBIC) 15 MG tablet, TAKE 1 TABLET BY MOUTH DAILY AS NEEDED FOR PAIN, Disp: 30 tablet, Rfl: 3 .  metFORMIN (GLUCOPHAGE) 500 MG tablet, TAKE 1 TABLET BY MOUTH 2 TIMES DAILY WITH A MEAL., Disp: 180 tablet, Rfl: 1 .  omeprazole (PRILOSEC) 40 MG capsule, Take 1 capsule (40 mg total) by mouth 2 (two) times daily before a meal., Disp: 180 capsule, Rfl: 0 .  ONE TOUCH LANCETS MISC, To use to check blood sugar once daily, Disp: 100 each, Rfl: 3 .  tamsulosin (FLOMAX) 0.4 MG CAPS capsule, TAKE 1 CAPSULE (0.4 MG TOTAL) BY MOUTH DAILY., Disp: 30 capsule, Rfl: 3 .  fluticasone (FLONASE) 50 MCG/ACT nasal spray, Place 2 sprays into both nostrils daily., Disp: 16 g, Rfl: 6  EXAM: This was a telehealth telephone visit and thus a physical exam was not completed.  ASSESSMENT AND PLAN:  Discussed the following assessment and plan:  Essential hypertension - Plan: Comp Met (CMET), CBC  Allergic rhinitis, unspecified seasonality, unspecified trigger  Diabetes mellitus type 2 in obese ([place]) - Plan: Hemoglobin A1c  Hypothyroidism, unspecified type - Plan: TSH  Hyperlipidemia, unspecified hyperlipidemia type - Plan: Lipid panel  Benign prostatic hyperplasia without lower urinary tract symptoms  Hypertension Well-controlled.  Continue current regimen.  He will come in for lab work.  Allergic rhinitis Symptoms are consistent with allergic rhinitis.  He will start on Flonase.  Diabetes mellitus type 2 in obese Seems to be adequately controlled.  He will continue his current regimen.  He will have an A1c checked.  Advised that if he does not eat breakfast he should not take his glipizide in the  morning.  Hypothyroidism Due for TSH check.  Hyperlipidemia Due for lipid panel.  BPH (benign prostatic hyperplasia) Symptoms have improved.  He will continue Flomax.  Social distancing precautions and sick precautions discussed regarding coronavirus.   I discussed the assessment and treatment plan with the patient. The patient was provided an opportunity to ask questions and all were answered. The patient agreed with the plan and demonstrated an understanding of the instructions.   The patient was advised to call back or seek an in-person evaluation if the symptoms worsen or if the condition fails to improve as anticipated.  I provided 25 minutes of non-face-to-face time during this encounter.   [name], MD

## 2019-03-23 ENCOUNTER — Telehealth: Payer: Self-pay | Admitting: Family Medicine

## 2019-03-23 NOTE — Assessment & Plan Note (Signed)
Well-controlled.  Continue current regimen.  He will come in for lab work. 

## 2019-03-23 NOTE — Assessment & Plan Note (Signed)
Due for TSH check

## 2019-03-23 NOTE — Telephone Encounter (Signed)
Please contact the patient and get him set up for lab work for 2 months and follow-up with me in the office in 4 months.

## 2019-03-23 NOTE — Assessment & Plan Note (Signed)
Due for lipid panel

## 2019-03-23 NOTE — Assessment & Plan Note (Signed)
Seems to be adequately controlled.  He will continue his current regimen.  He will have an A1c checked.  Advised that if he does not eat breakfast he should not take his glipizide in the morning.

## 2019-03-23 NOTE — Assessment & Plan Note (Signed)
Symptoms have improved.  He will continue Flomax.

## 2019-03-23 NOTE — Assessment & Plan Note (Signed)
Symptoms are consistent with allergic rhinitis.  He will start on Flonase.

## 2019-03-24 NOTE — Telephone Encounter (Signed)
Called pt and left a VM to call me back to schedule for lab work and 4 month f/u appt.

## 2019-03-25 NOTE — Telephone Encounter (Signed)
Sent to [place] to schedule pt  

## 2019-03-25 NOTE — Telephone Encounter (Signed)
Pt has been scheduled. Thank you.

## 2019-03-31 ENCOUNTER — Other Ambulatory Visit: Payer: Self-pay | Admitting: Family Medicine

## 2019-03-31 MED FILL — metFORMIN HCL 500 MG TABS: 500 | 90 days supply | Qty: 180 | Fill #0

## 2019-03-31 MED FILL — LEVOTHYROXINE 50 MCG TABLET: 50 | 90 days supply | Qty: 90 | Fill #0

## 2019-03-31 MED FILL — CITALOPRAM HBR 20 MG TABLET: 20 | 90 days supply | Qty: 90 | Fill #0

## 2019-03-31 MED FILL — buPROPion HCL ER (XL) 300 M: 300 | 30 days supply | Qty: 30 | Fill #0 | Status: TO

## 2019-03-31 MED FILL — ATORVASTATIN 20 MG TABLET: 20 | 90 days supply | Qty: 90 | Fill #0

## 2019-03-31 MED FILL — MELOXICAM 15 MG TABLET: 15 | 30 days supply | Qty: 30 | Fill #0 | Status: TO

## 2019-03-31 MED FILL — LISINOPRIL-HCTZ 10-12.5 MG: 10-12.5 | 90 days supply | Qty: 90 | Fill #0

## 2019-05-12 ENCOUNTER — Other Ambulatory Visit: Payer: Self-pay | Admitting: Family Medicine

## 2019-05-28 ENCOUNTER — Other Ambulatory Visit: Payer: Self-pay

## 2019-05-28 ENCOUNTER — Other Ambulatory Visit (INDEPENDENT_AMBULATORY_CARE_PROVIDER_SITE_OTHER): Payer: No Typology Code available for payment source

## 2019-05-28 DIAGNOSIS — E1169 Type 2 diabetes mellitus with other specified complication: Secondary | ICD-10-CM

## 2019-05-28 DIAGNOSIS — E785 Hyperlipidemia, unspecified: Secondary | ICD-10-CM

## 2019-05-28 DIAGNOSIS — E669 Obesity, unspecified: Secondary | ICD-10-CM | POA: Diagnosis not present

## 2019-05-28 DIAGNOSIS — E039 Hypothyroidism, unspecified: Secondary | ICD-10-CM | POA: Diagnosis not present

## 2019-05-28 DIAGNOSIS — I10 Essential (primary) hypertension: Secondary | ICD-10-CM

## 2019-05-28 LAB — LIPID PANEL
Cholesterol: 171 mg/dL (ref 0–200)
HDL: 59.1 mg/dL (ref >39.00)
LDL Cholesterol: 90 mg/dL (ref 0–99)
NonHDL: 111.99
Total CHOL/HDL Ratio: 3
Triglycerides: 111 mg/dL (ref 0.0–149.0)
VLDL: 22.2 mg/dL (ref 0.0–40.0)

## 2019-05-28 LAB — COMPREHENSIVE METABOLIC PANEL
ALT: 32 U/L (ref 0–53)
AST: 27 U/L (ref 0–37)
Albumin: 4.4 g/dL (ref 3.5–5.2)
Alkaline Phosphatase: 71 U/L (ref 39–117)
BUN: 17 mg/dL (ref 6–23)
CO2: 31 mEq/L (ref 19–32)
Calcium: 9.8 mg/dL (ref 8.4–10.5)
Chloride: 97 mEq/L (ref 96–112)
Creatinine, Ser: 0.79 mg/dL (ref 0.40–1.50)
GFR: 95.68 mL/min (ref >60.00)
Glucose, Bld: 178 mg/dL — ABNORMAL HIGH (ref 70–99)
Potassium: 4.3 mEq/L (ref 3.5–5.1)
Sodium: 136 mEq/L (ref 135–145)
Total Bilirubin: 0.6 mg/dL (ref 0.2–1.2)
Total Protein: 6.7 g/dL (ref 6.0–8.3)

## 2019-05-28 LAB — TSH: TSH: 2.06 u[IU]/mL (ref 0.35–4.50)

## 2019-05-28 LAB — CBC
HCT: 41.4 % (ref 39.0–52.0)
Hemoglobin: 13.9 g/dL (ref 13.0–17.0)
MCHC: 33.7 g/dL (ref 30.0–36.0)
MCV: 89.3 fl (ref 78.0–100.0)
Platelets: 235 10*3/uL (ref 150.0–400.0)
RBC: 4.64 Mil/uL (ref 4.22–5.81)
RDW: 13.1 % (ref 11.5–15.5)
WBC: 7.1 10*3/uL (ref 4.0–10.5)

## 2019-05-28 LAB — HEMOGLOBIN A1C: Hgb A1c MFr Bld: 8 % — ABNORMAL HIGH (ref 4.6–6.5)

## 2019-06-03 ENCOUNTER — Other Ambulatory Visit: Payer: Self-pay | Admitting: Family Medicine

## 2019-07-08 ENCOUNTER — Other Ambulatory Visit: Payer: Self-pay | Admitting: Family Medicine

## 2019-07-10 ENCOUNTER — Other Ambulatory Visit: Payer: Self-pay | Admitting: Family Medicine

## 2019-07-29 ENCOUNTER — Ambulatory Visit (INDEPENDENT_AMBULATORY_CARE_PROVIDER_SITE_OTHER): Payer: No Typology Code available for payment source | Admitting: Family Medicine

## 2019-07-29 ENCOUNTER — Encounter: Payer: Self-pay | Admitting: Family Medicine

## 2019-07-29 ENCOUNTER — Other Ambulatory Visit: Payer: Self-pay

## 2019-07-29 VITALS — Ht 71.0 in | Wt 233.0 lb

## 2019-07-29 DIAGNOSIS — E1169 Type 2 diabetes mellitus with other specified complication: Secondary | ICD-10-CM | POA: Diagnosis not present

## 2019-07-29 DIAGNOSIS — F330 Major depressive disorder, recurrent, mild: Secondary | ICD-10-CM

## 2019-07-29 DIAGNOSIS — N401 Enlarged prostate with lower urinary tract symptoms: Secondary | ICD-10-CM | POA: Diagnosis not present

## 2019-07-29 DIAGNOSIS — E669 Obesity, unspecified: Secondary | ICD-10-CM | POA: Diagnosis not present

## 2019-07-29 DIAGNOSIS — R351 Nocturia: Secondary | ICD-10-CM

## 2019-07-29 DIAGNOSIS — Z9189 Other specified personal risk factors, not elsewhere classified: Secondary | ICD-10-CM

## 2019-07-29 MED ORDER — EMPAGLIFLOZIN 10 MG PO TABS: 10.0000 mg | ORAL_TABLET | Freq: Every day | ORAL | 3 refills | Status: DC

## 2019-07-29 NOTE — Progress Notes (Signed)
 Virtual Visit via telephone Note  This visit type was conducted due to national recommendations for restrictions regarding the COVID-19 pandemic (e.g. social distancing).  This format is felt to be most appropriate for this patient at this time.  All issues noted in this document were discussed and addressed.  No physical exam was performed (except for noted visual exam findings with Video Visits).   I connected with Timothy Edwards today at 10:30 AM EDT by telephone and verified that I am speaking with the correct person using two identifiers. Location patient: home Location provider: work Persons participating in the virtual visit: patient, provider  I discussed the limitations, risks, security and privacy concerns of performing an evaluation and management service by telephone and the availability of in person appointments. I also discussed with the patient that there may be a patient responsible charge related to this service. The patient expressed understanding and agreed to proceed.  Interactive audio and video telecommunications were attempted between this provider and patient, however failed, due to patient having technical difficulties OR patient did not have access to video capability.  We continued and completed visit with audio only.  Reason for visit: follow-up  HPI: DIABETES Disease Monitoring: Blood Sugar ranges-333 recently Polyuria/phagia/dipsia- notes nocturia and polyuria      Optho- due Medications: Compliance- taking glipizide, only takes metformin once daily Hypoglycemic symptoms- no  Depression: Patient notes he is unsure if he feels depressed though he does note a lack of motivation.  He has been laying in bed a lot and does note some tiredness even when he gets up.  Does not sleep well.  Gets 1 to 1-1/2 hours of sleep at a time.  He does note snoring though no apnea.  He does not always wake well rested.  He notes no hypersomnia.  No SI.  He does continue on Celexa  and Wellbutrin.  He did see psychiatry in the 1970s and underwent therapy and electroshock therapy.  He wonders if there is something that can help give him pep.  PHQ 9 scoring is listed below.  The patient did have a hard time giving specific answers to a lot of the questions though did note he had no thoughts of suicide.  Depression screen PHQ 2/9 07/29/2019 07/29/2019 09/11/2017 03/30/2017 06/27/2016  Decreased Interest 3 0 2 0 0  Down, Depressed, Hopeless 3 0 1 0 0  PHQ - 2 Score 6 0 3 0 0  Altered sleeping 2 - 3 1 -  Tired, decreased energy 2 - 2 0 -  Change in appetite 0 - 2 1 -  Feeling bad or failure about yourself  1 - 1 1 -  Trouble concentrating 3 - 0 0 -  Moving slowly or fidgety/restless 0 - 0 0 -  Suicidal thoughts 0 - 0 0 -  PHQ-9 Score 14 - 11 3 -  Difficult doing work/chores - - Somewhat difficult - -    BPH: He does note nocturia and some daytime frequency though no dysuria, straining, poor flow, or issues emptying his bladder.  He is on Flomax.    ROS: See pertinent positives and negatives per HPI.  Past Medical History:  Diagnosis Date  . Depression   . Diabetes mellitus without complication ([facility])   . Hyperlipidemia   . Hypertension   . Unspecified hypothyroidism   . Vitamin D deficiency     Past Surgical History:  Procedure Laterality Date  . ESOPHAGOGASTRODUODENOSCOPY (EGD) WITH PROPOFOL N/A 07/15/2018     Procedure: ESOPHAGOGASTRODUODENOSCOPY (EGD) WITH PROPOFOL;  Surgeon: Lin Landsman, MD;  Location: [facility] ENDOSCOPY;  Service: Gastroenterology;  Laterality: N/A;  . FOOT SURGERY      Family History  Problem Relation Age of Onset  . Obesity Father   . Aneurysm Father   . Hypertension Father   . Gallbladder disease Mother     SOCIAL HX: Non-smoker.   Current Outpatient Medications:  .  atorvastatin (LIPITOR) 20 MG tablet, TAKE 1 TABLET BY MOUTH DAILY., Disp: 90 tablet, Rfl: 1 .  Blood Glucose Monitoring Suppl (ONE TOUCH BASIC SYSTEM) W/DEVICE  KIT, To use to check blood sugars once daily, Disp: 1 each, Rfl: 0 .  buPROPion (WELLBUTRIN XL) 300 MG 24 hr tablet, TAKE 1 TABLET (300 MG TOTAL) BY MOUTH DAILY., Disp: 30 tablet, Rfl: 0 .  cholecalciferol (VITAMIN D) 1000 UNITS tablet, Take 1,000 Units by mouth daily.  , Disp: , Rfl:  .  citalopram (CELEXA) 20 MG tablet, TAKE 1 TABLET (20 MG TOTAL) BY MOUTH DAILY., Disp: 90 tablet, Rfl: 0 .  clobetasol ointment (TEMOVATE) 0.05 %, Apply topically 2 (two) times daily., Disp: 30 g, Rfl: 0 .  fluticasone (FLONASE) 50 MCG/ACT nasal spray, Place 2 sprays into both nostrils daily., Disp: 16 g, Rfl: 6 .  GLIPIZIDE XL 5 MG 24 hr tablet, TAKE 1 TABLET (5 MG TOTAL) BY MOUTH DAILY WITH BREAKFAST., Disp: 90 tablet, Rfl: 1 .  glucose blood test strip, To use to check blood sugar once daily, Disp: 100 each, Rfl: 12 .  levothyroxine (SYNTHROID) 50 MCG tablet, TAKE 1 TABLET (50 MCG TOTAL) BY MOUTH DAILY., Disp: 90 tablet, Rfl: 0 .  lisinopril-hydrochlorothiazide (ZESTORETIC) 10-12.5 MG tablet, TAKE 1 TABLET BY MOUTH DAILY., Disp: 90 tablet, Rfl: 0 .  meloxicam (MOBIC) 15 MG tablet, TAKE 1 TABLET BY MOUTH DAILY AS NEEDED FOR PAIN, Disp: 30 tablet, Rfl: 1 .  metFORMIN (GLUCOPHAGE) 500 MG tablet, TAKE 1 TABLET BY MOUTH 2 TIMES DAILY WITH A MEAL., Disp: 180 tablet, Rfl: 1 .  omeprazole (PRILOSEC) 40 MG capsule, TAKE 1 CAPSULE BY MOUTH 2 TIMES DAILY BEFORE A MEAL., Disp: 180 capsule, Rfl: 0 .  ONE TOUCH LANCETS MISC, To use to check blood sugar once daily, Disp: 100 each, Rfl: 3 .  tamsulosin (FLOMAX) 0.4 MG CAPS capsule, TAKE 1 CAPSULE (0.4 MG TOTAL) BY MOUTH DAILY., Disp: 30 capsule, Rfl: 3 .  empagliflozin (JARDIANCE) 10 MG TABS tablet, Take 10 mg by mouth daily before breakfast., Disp: 30 tablet, Rfl: 3  EXAM: This is a telehealth telephone visit notes no physical exam was completed.  ASSESSMENT AND PLAN:  Discussed the following assessment and plan:  Diabetes mellitus type 2 in obese Uncontrolled.  He does  have polyuria.  I do think his uncontrolled diabetes is contributing to his nocturia and polyuria.  Discussed continuing glipizide and metformin once daily.  We will add Jardiance.  He reports having issues remembering to take doses of his metformin in the evening and with the Metformin extended release recall we cannot change this.  Depression I think this is contributing to his lack of motivation and likely his sleep issues.  We will discuss transitioning him from Celexa to an alternative SSRI with our clinical pharmacist and then contact the patient once we hear back.  We will additionally obtain lab work as outlined below just to ensure that there is no other underlying cause for his symptoms.  BPH (benign prostatic hyperplasia) We will continue Flomax.  Could consider  increasing the dose if his urinary frequency does not improve with improved glucose control.    I discussed the assessment and treatment plan with the patient. The patient was provided an opportunity to ask questions and all were answered. The patient agreed with the plan and demonstrated an understanding of the instructions.   The patient was advised to call back or seek an in-person evaluation if the symptoms worsen or if the condition fails to improve as anticipated.  I provided 27 minutes of non-face-to-face time during this encounter.   [name], MD

## 2019-07-29 NOTE — Assessment & Plan Note (Signed)
We will continue Flomax.  Could consider increasing the dose if his urinary frequency does not improve with improved glucose control.

## 2019-07-29 NOTE — Assessment & Plan Note (Signed)
I think this is contributing to his lack of motivation and likely his sleep issues.  We will discuss transitioning him from Celexa to an alternative SSRI with our clinical pharmacist and then contact the patient once we hear back.  We will additionally obtain lab work as outlined below just to ensure that there is no other underlying cause for his symptoms.

## 2019-07-29 NOTE — Assessment & Plan Note (Signed)
Uncontrolled.  He does have polyuria.  I do think his uncontrolled diabetes is contributing to his nocturia and polyuria.  Discussed continuing glipizide and metformin once daily.  We will add Jardiance.  He reports having issues remembering to take doses of his metformin in the evening and with the Metformin extended release recall we cannot change this.

## 2019-08-08 ENCOUNTER — Other Ambulatory Visit: Payer: Self-pay | Admitting: Family Medicine

## 2019-08-12 ENCOUNTER — Other Ambulatory Visit: Payer: Self-pay | Admitting: Family Medicine

## 2019-08-15 ENCOUNTER — Other Ambulatory Visit: Payer: No Typology Code available for payment source

## 2019-08-25 ENCOUNTER — Encounter: Payer: Self-pay | Admitting: Family Medicine

## 2019-08-25 ENCOUNTER — Ambulatory Visit (INDEPENDENT_AMBULATORY_CARE_PROVIDER_SITE_OTHER): Payer: No Typology Code available for payment source | Admitting: Family Medicine

## 2019-08-25 ENCOUNTER — Other Ambulatory Visit: Payer: Self-pay

## 2019-08-25 VITALS — BP 138/72 | HR 73 | Temp 98.0°F | Resp 18 | Ht 71.0 in | Wt 230.4 lb

## 2019-08-25 DIAGNOSIS — Z9189 Other specified personal risk factors, not elsewhere classified: Secondary | ICD-10-CM

## 2019-08-25 DIAGNOSIS — E669 Obesity, unspecified: Secondary | ICD-10-CM | POA: Diagnosis not present

## 2019-08-25 DIAGNOSIS — E1169 Type 2 diabetes mellitus with other specified complication: Secondary | ICD-10-CM | POA: Diagnosis not present

## 2019-08-25 DIAGNOSIS — F330 Major depressive disorder, recurrent, mild: Secondary | ICD-10-CM | POA: Diagnosis not present

## 2019-08-25 NOTE — Progress Notes (Signed)
Subjective:    Patient ID: Timothy Edwards, male    DOB: [birth year], 73 y.o.   MRN: [id number]  HPI   Patient presents to clinic due to elevated blood sugar reading and wondering if his diabetes medications need to be adjusted.  Patient states over the weekend he checked his blood sugar on meter and it read high.  Unsure of how high his blood sugar meter goes up to, but we can assume that the reading was at least over 400.  Patient states he has really fallen off the wagon in regards to eating, states he loves sweets and eat lots of ice cream, cakes and cookies and has not been monitoring his carbohydrate or sugar intake at all.  States he does want to get his blood sugar back under control.  Currently he is on glipizide 5 mg/day, Jardiance 10 mg/day and metformin 500 mg twice daily.  Jardiance was added onto regimen in June 2020  Currently patient is on Celexa for control of depression.  Overall feels this medication is helpful.  Denies SI or HI.  Does have days where he feels down but attributes that to being alone at the house with the dogs.  Enjoys when his family is home.  Attributes himself letting his blood sugars get out of control to not being ligated and having times of feeling down.  Patient Active Problem List   Diagnosis Date Noted  . BPH (benign prostatic hyperplasia) 11/11/2018  . Dermatitis 01/07/2018  . Impacted esophageal foreign body 01/07/2018  . Neck stiffness 12/29/2016  . Left lateral knee pain 05/11/2016  . Depression 04/27/2015  . Arthritis 01/26/2015  . Elevated LFTs 07/27/2014  . Memory loss 05/05/2013  . Diabetes mellitus type 2 in obese ([place]) 03/18/2013  . Vitamin D deficiency   . Hypertension 10/11/2011  . Hyperlipidemia 10/11/2011  . Hypothyroidism 10/11/2011  . Generalized anxiety disorder 10/11/2011  . Allergic rhinitis 10/11/2011  . Obesity (BMI 30-39.9) 10/11/2011   Social History   Tobacco Use  . Smoking status: Never Smoker  .  Smokeless tobacco: Never Used  Substance Use Topics  . Alcohol use: Yes    Alcohol/week: 3.0 standard drinks    Types: 3 Glasses of wine per week   Review of Systems  Constitutional: Negative for chills, and fever. +fatigue HENT: Negative for congestion, ear pain, sinus pain and sore throat.   Eyes: Negative.   Respiratory: Negative for cough, shortness of breath and wheezing.   Cardiovascular: Negative for chest pain, palpitations and leg swelling.  Gastrointestinal: Negative for abdominal pain, diarrhea, nausea and vomiting.  Genitourinary: Negative for dysuria, frequency and urgency.  Musculoskeletal: Negative for arthralgias and myalgias.  Skin: Negative for color change, pallor and rash.  Neurological: Negative for syncope, light-headedness and headaches.  Psychiatric/Behavioral: The patient is not nervous/anxious.       Objective:   Physical Exam Vitals signs and nursing note reviewed.  Constitutional:      General: He is not in acute distress.    Appearance: He is not ill-appearing, toxic-appearing or diaphoretic.  HENT:     Head: Normocephalic and atraumatic.  Eyes:     General: No scleral icterus.    Extraocular Movements: Extraocular movements intact.     Pupils: Pupils are equal, round, and reactive to light.  Cardiovascular:     Rate and Rhythm: Normal rate and regular rhythm.  Skin:    General: Skin is warm and dry.     Coloration: Skin is not  jaundiced or pale.  Neurological:     Mental Status: He is alert and oriented to person, place, and time.     Gait: Gait normal.  Psychiatric:        Behavior: Behavior normal.        Thought Content: Thought content normal.     Comments: Appears in good spirits.  States he realizes he is let himself go regards to eating and his blood sugar.  Wants to get back on track.  Expresses motivation to get his blood sugar back to good level.     Today's Vitals   08/25/19 1411  BP: 138/72  Pulse: 73  Resp: 18  Temp: 98  F (36.7 C)  TempSrc: Oral  SpO2: 95%  Weight: 230 lb 6.4 oz (104.5 kg)  Height: _0  (1.803 m)   Body mass index is 32.13 kg/m.     Assessment & Plan:   We will get new A1c to see where patient's level is at.  I will reach out to our pharmacist and do referral to chronic care management for assistance in getting this patient very good diabetes control.  Different ideas include stopping glipizide and Jardiance, maximizing metformin and adding on a GLP-1.    1. Diabetes mellitus type 2 in obese ([place])  - Hemoglobin A1c - Comp Met (CMET) - Ambulatory referral to Chronic Care Management Services  2. Mild episode of recurrent major depressive disorder ([facility])  - CBC - Comp Met (CMET) - TSH - B12  3. Lack of motivation  - CBC - Comp Met (CMET) - TSH - B12   CCM referral placed.  Patient aware we will contact him with lab results and instructions on plans moving forward in regards to any medication changes.  Encourage patient to continue to work on monitoring sugar intake, patient given handout outlining overall healthy diet.  Discussed eating foods and serving size portions; reading labels and looking for added sugars and total carbohydrates.  Patient again expresses motivation to get his blood sugar back on track.  He will keep appointment as planned in October 2020 with PCP.

## 2019-08-25 NOTE — Patient Instructions (Signed)
DASH Eating Plan DASH stands for "Dietary Approaches to Stop Hypertension." The DASH eating plan is a healthy eating plan that has been shown to reduce high blood pressure (hypertension) & is a good approach for diabetes management. It may also reduce your risk for type 2 diabetes, heart disease, and stroke. The DASH eating plan may also help with weight loss. What are tips for following this plan?  General guidelines  Avoid eating more than 2,300 mg (milligrams) of salt (sodium) a day. If you have hypertension, you may need to reduce your sodium intake to 1,500 mg a day.  Limit alcohol intake to no more than 1 drink a day for nonpregnant women and 2 drinks a day for men. One drink equals 12 oz of beer, 5 oz of wine, or 1 oz of hard liquor.  Work with your health care provider to maintain a healthy body weight or to lose weight. Ask what an ideal weight is for you.  Get at least 30 minutes of exercise that causes your heart to beat faster (aerobic exercise) most days of the week. Activities may include walking, swimming, or biking.  Work with your health care provider or diet and nutrition specialist (dietitian) to adjust your eating plan to your individual calorie needs. Reading food labels   Check food labels for the amount of sodium per serving. Choose foods with less than 5 percent of the Daily Value of sodium. Generally, foods with less than 300 mg of sodium per serving fit into this eating plan.  To find whole grains, look for the word "whole" as the first word in the ingredient list. Shopping  Buy products labeled as "low-sodium" or "no salt added."  Buy fresh foods. Avoid canned foods and premade or frozen meals. Cooking  Avoid adding salt when cooking. Use salt-free seasonings or herbs instead of table salt or sea salt. Check with your health care provider or pharmacist before using salt substitutes.  Do not fry foods. Cook foods using healthy methods such as baking, boiling,  grilling, and broiling instead.  Cook with heart-healthy oils, such as olive, canola, soybean, or sunflower oil. Meal planning  Eat a balanced diet that includes: ? 5 or more servings of fruits and vegetables each day. At each meal, try to fill half of your plate with fruits and vegetables. ? Up to 6-8 servings of whole grains each day. ? Less than 6 oz of lean meat, poultry, or fish each day. A 3-oz serving of meat is about the same size as a deck of cards. One egg equals 1 oz. ? 2 servings of low-fat dairy each day. ? A serving of nuts, seeds, or beans 5 times each week. ? Heart-healthy fats. Healthy fats called Omega-3 fatty acids are found in foods such as flaxseeds and coldwater fish, like sardines, salmon, and mackerel.  Limit how much you eat of the following: ? Canned or prepackaged foods. ? Food that is high in trans fat, such as fried foods. ? Food that is high in saturated fat, such as fatty meat. ? Sweets, desserts, sugary drinks, and other foods with added sugar. ? Full-fat dairy products.  Do not salt foods before eating.  Try to eat at least 2 vegetarian meals each week.  Eat more home-cooked food and less restaurant, buffet, and fast food.  When eating at a restaurant, ask that your food be prepared with less salt or no salt, if possible. What foods are recommended? The items listed may not be  a complete list. Talk with your dietitian about what dietary choices are best for you. Grains Whole-grain or whole-wheat bread. Whole-grain or whole-wheat pasta. Brown rice. [place]. Bulgur. Whole-grain and low-sodium cereals. Pita bread. Low-fat, low-sodium crackers. Whole-wheat flour tortillas. Vegetables Fresh or frozen vegetables (raw, steamed, roasted, or grilled). Low-sodium or reduced-sodium tomato and vegetable juice. Low-sodium or reduced-sodium tomato sauce and tomato paste. Low-sodium or reduced-sodium canned vegetables. Fruits All fresh, dried, or frozen  fruit. Canned fruit in natural juice (without added sugar). Meat and other protein foods Skinless chicken or [place]. Ground chicken or [place]. Pork with fat trimmed off. Fish and seafood. Egg whites. Dried beans, peas, or lentils. Unsalted nuts, nut butters, and seeds. Unsalted canned beans. Lean cuts of beef with fat trimmed off. Low-sodium, lean deli meat. Dairy Low-fat (1%) or fat-free (skim) milk. Fat-free, low-fat, or reduced-fat cheeses. Nonfat, low-sodium ricotta or cottage cheese. Low-fat or nonfat yogurt. Low-fat, low-sodium cheese. Fats and oils Soft margarine without trans fats. Vegetable oil. Low-fat, reduced-fat, or light mayonnaise and salad dressings (reduced-sodium). Canola, safflower, olive, soybean, and sunflower oils. Avocado. Seasoning and other foods Herbs. Spices. Seasoning mixes without salt. Unsalted popcorn and pretzels. Fat-free sweets. What foods are not recommended? The items listed may not be a complete list. Talk with your dietitian about what dietary choices are best for you. Grains Baked goods made with fat, such as croissants, muffins, or some breads. Dry pasta or rice meal packs. Vegetables Creamed or fried vegetables. Vegetables in a cheese sauce. Regular canned vegetables (not low-sodium or reduced-sodium). Regular canned tomato sauce and paste (not low-sodium or reduced-sodium). Regular tomato and vegetable juice (not low-sodium or reduced-sodium). Angie Fava. Olives. Fruits Canned fruit in a light or heavy syrup. Fried fruit. Fruit in cream or butter sauce. Meat and other protein foods Fatty cuts of meat. Ribs. Fried meat. Berniece Salines. Sausage. Bologna and other processed lunch meats. Salami. Fatback. Hotdogs. Bratwurst. Salted nuts and seeds. Canned beans with added salt. Canned or smoked fish. Whole eggs or egg yolks. Chicken or [place] with skin. Dairy Whole or 2% milk, cream, and half-and-half. Whole or full-fat cream cheese. Whole-fat or sweetened yogurt. Full-fat  cheese. Nondairy creamers. Whipped toppings. Processed cheese and cheese spreads. Fats and oils Butter. Stick margarine. Lard. Shortening. Ghee. Bacon fat. Tropical oils, such as coconut, palm kernel, or palm oil. Seasoning and other foods Salted popcorn and pretzels. Onion salt, garlic salt, seasoned salt, table salt, and sea salt. Worcestershire sauce. Tartar sauce. Barbecue sauce. Teriyaki sauce. Soy sauce, including reduced-sodium. Steak sauce. Canned and packaged gravies. Fish sauce. Oyster sauce. Cocktail sauce. Horseradish that you find on the shelf. Ketchup. Mustard. Meat flavorings and tenderizers. Bouillon cubes. Hot sauce and Tabasco sauce. Premade or packaged marinades. Premade or packaged taco seasonings. Relishes. Regular salad dressings. Where to find more information:  National Heart, Lung, and [place]: https://[place]-eaton.com/  American Heart Association: www.heart.org Summary  The DASH eating plan is a healthy eating plan that has been shown to reduce high blood pressure (hypertension). It may also reduce your risk for type 2 diabetes, heart disease, and stroke.  With the DASH eating plan, you should limit salt (sodium) intake to 2,300 mg a day. If you have hypertension, you may need to reduce your sodium intake to 1,500 mg a day.  When on the DASH eating plan, aim to eat more fresh fruits and vegetables, whole grains, lean proteins, low-fat dairy, and heart-healthy fats.  Work with your health care provider or diet and nutrition specialist (  dietitian) to adjust your eating plan to your individual calorie needs. This information is not intended to replace advice given to you by your health care provider. Make sure you discuss any questions you have with your health care provider. Document Released: 11/09/2011 Document Revised: 11/02/2017 Document Reviewed: 11/13/2016 Elsevier Patient Education  2020 [employer].

## 2019-08-26 ENCOUNTER — Telehealth: Payer: Self-pay | Admitting: *Deleted

## 2019-08-26 LAB — CBC
HCT: 41.5 % (ref 39.0–52.0)
Hemoglobin: 14.1 g/dL (ref 13.0–17.0)
MCHC: 34 g/dL (ref 30.0–36.0)
MCV: 88.9 fl (ref 78.0–100.0)
Platelets: 231 10*3/uL (ref 150.0–400.0)
RBC: 4.67 Mil/uL (ref 4.22–5.81)
RDW: 12.9 % (ref 11.5–15.5)
WBC: 5.3 10*3/uL (ref 4.0–10.5)

## 2019-08-26 LAB — COMPREHENSIVE METABOLIC PANEL
ALT: 34 U/L (ref 0–53)
AST: 29 U/L (ref 0–37)
Albumin: 4 g/dL (ref 3.5–5.2)
Alkaline Phosphatase: 73 U/L (ref 39–117)
BUN: 12 mg/dL (ref 6–23)
CO2: 26 mEq/L (ref 19–32)
Calcium: 9.7 mg/dL (ref 8.4–10.5)
Chloride: 98 mEq/L (ref 96–112)
Creatinine, Ser: 0.85 mg/dL (ref 0.40–1.50)
GFR: 87.87 mL/min (ref >60.00)
Glucose, Bld: 237 mg/dL — ABNORMAL HIGH (ref 70–99)
Potassium: 4 mEq/L (ref 3.5–5.1)
Sodium: 134 mEq/L — ABNORMAL LOW (ref 135–145)
Total Bilirubin: 0.7 mg/dL (ref 0.2–1.2)
Total Protein: 6.1 g/dL (ref 6.0–8.3)

## 2019-08-26 LAB — HEMOGLOBIN A1C: Hgb A1c MFr Bld: 12.8 % — ABNORMAL HIGH (ref 4.6–6.5)

## 2019-08-26 LAB — VITAMIN B12: Vitamin B-12: 469 pg/mL (ref 211–911)

## 2019-08-26 LAB — TSH: TSH: 1.77 u[IU]/mL (ref 0.35–4.50)

## 2019-08-26 NOTE — Telephone Encounter (Signed)
Please place future orders for lab appt.  

## 2019-08-26 NOTE — Telephone Encounter (Signed)
Patient had his lab work drawn yesterday during a visit with Lauren.  He does not need a lab appointment for later this week.  I will forward to Gae Bon to cancel.

## 2019-08-28 MED ORDER — METFORMIN HCL 500 MG PO TABS: ORAL_TABLET | ORAL | 1 refills | Status: DC

## 2019-08-28 MED ORDER — OZEMPIC (0.25 OR 0.5 MG/DOSE) 2 MG/1.5ML ~~LOC~~ SOPN: PEN_INJECTOR | SUBCUTANEOUS | 1 refills | Status: AC

## 2019-08-28 NOTE — Addendum Note (Signed)
Addended by: Philis Nettle on: 08/28/2019 01:09 PM   Modules accepted: Orders

## 2019-08-29 ENCOUNTER — Encounter: Payer: Self-pay | Admitting: Family Medicine

## 2019-08-29 ENCOUNTER — Other Ambulatory Visit: Payer: No Typology Code available for payment source

## 2019-09-02 MED ORDER — ESCITALOPRAM OXALATE 10 MG PO TABS: 10.0000 mg | ORAL_TABLET | Freq: Every day | ORAL | 1 refills | Status: DC

## 2019-09-04 ENCOUNTER — Other Ambulatory Visit: Payer: Self-pay | Admitting: Family Medicine

## 2019-09-15 ENCOUNTER — Ambulatory Visit: Payer: No Typology Code available for payment source | Admitting: Pharmacist

## 2019-09-15 ENCOUNTER — Encounter: Payer: Self-pay | Admitting: Pharmacist

## 2019-09-15 DIAGNOSIS — E669 Obesity, unspecified: Secondary | ICD-10-CM

## 2019-09-15 DIAGNOSIS — E1169 Type 2 diabetes mellitus with other specified complication: Secondary | ICD-10-CM

## 2019-09-15 NOTE — Chronic Care Management (AMB) (Signed)
Chronic Care Management   Note  09/15/2019 Name: Timothy Edwards MRN: [id number] DOB: 1946-06-17   Subjective:  Timothy Edwards is a 73 y.o. year old male who is a primary care patient of [name], [name], MD. The CCM team was consulted for assistance with chronic disease management and care coordination needs.    Contacted patient for medication management review.   Review of patient status, including review of consultants reports, laboratory and other test data, was performed as part of comprehensive evaluation and provision of chronic care management services.   Objective:  Lab Results  Component Value Date   CREATININE 0.85 08/25/2019   CREATININE 0.79 05/28/2019   CREATININE 0.77 11/11/2018    Lab Results  Component Value Date   HGBA1C 12.8 (H) 08/25/2019       Component Value Date/Time   CHOL 171 05/28/2019 0858   TRIG 111.0 05/28/2019 0858   HDL 59.10 05/28/2019 0858   CHOLHDL 3 05/28/2019 0858   VLDL 22.2 05/28/2019 0858   LDLCALC 90 05/28/2019 0858   LDLDIRECT 87.0 07/10/2018 0926    Clinical ASCVD: No  The 10-year ASCVD risk score Mikey Bussing DC Jr., et al., 2013) is: 48.2%   Values used to calculate the score:     Age: 19 years     Sex: Male     Is Non-Hispanic African American: No     Diabetic: Yes     Tobacco smoker: No     Systolic Blood Pressure: 017 mmHg     Is BP treated: Yes     HDL Cholesterol: 59.1 mg/dL     Total Cholesterol: 171 mg/dL    BP Readings from Last 3 Encounters:  08/25/19 138/72  11/11/18 (!) 150/80  07/15/18 (!) 142/76    No Known Allergies  Medications Reviewed Today    Reviewed by De Hollingshead, Artemus (Pharmacist) on 09/15/19 at 1250  Med List Status: <None>  Medication Order Taking? Sig Documenting Provider Last Dose Status Informant  atorvastatin (LIPITOR) 20 MG tablet [id number] Yes TAKE 1 TABLET BY MOUTH DAILY. [name], MD Taking Active   Blood Glucose Monitoring Suppl ([place])  W/DEVICE KIT [id number] Yes To use to check blood sugars once daily Jackolyn Confer, MD Taking Active   buPROPion (WELLBUTRIN XL) 300 MG 24 hr tablet [id number] Yes TAKE 1 TABLET (300 MG TOTAL) BY MOUTH DAILY. [name], MD Taking Active   cholecalciferol (VITAMIN D) 1000 UNITS tablet [id number] Yes Take 1,000 Units by mouth daily.   [provider] Taking Active   clobetasol ointment (TEMOVATE) 0.05 % [id number] Yes Apply topically 2 (two) times daily. [name], MD Taking Active   escitalopram (LEXAPRO) 10 MG tablet [id number] Yes Take 1 tablet (10 mg total) by mouth daily. [name], MD Taking Active   fluticasone [facility]) 50 MCG/ACT nasal spray [id number] No Place 2 sprays into both nostrils daily.  Patient not taking: Reported on 09/15/2019   [name], MD Not Taking Active   glucose blood test strip [id number] Yes To use to check blood sugar once daily Jackolyn Confer, MD Taking Active   levothyroxine (SYNTHROID) 50 MCG tablet [id number] Yes TAKE 1 TABLET (50 MCG TOTAL) BY MOUTH DAILY. [name], MD Taking Active   lisinopril-hydrochlorothiazide (ZESTORETIC) 10-12.5 MG tablet [id number] Yes TAKE 1 TABLET BY MOUTH DAILY. [name], MD Taking Active   meloxicam [facility]) 15 MG tablet [id number] Yes TAKE 1 TABLET BY MOUTH  DAILY AS NEEDED FOR PAIN [name], MD Taking Active   metFORMIN (GLUCOPHAGE) 500 MG tablet [id number] Yes Take 2 tablets in AM and 1 in PM for 7 days, then increase to 2 tablets in AM and 2 tablets in PM Guse, Jacquelynn Cree, FNP Taking Active            Med Note Mayo Ao Sep 15, 2019 12:42 PM) Taking 500 mg BID  omeprazole (PRILOSEC) 40 MG capsule [id number] Yes TAKE 1 CAPSULE BY MOUTH 2 TIMES DAILY BEFORE A MEAL. [name], MD Taking Active   ONE [facility] LANCETS [place] [id number] Yes To use to check blood sugar once daily Jackolyn Confer, MD Taking Active   Semaglutide,0.25 or 0.5MG/DOS,  (OZEMPIC, 0.25 OR 0.5 MG/DOSE,) 2 MG/1.5ML SOPN [id number] Yes Inject 0.25 mg into the skin once a week for 30 days, THEN 0.5 mg once a week. Jodelle Green, FNP Taking Active   tamsulosin (FLOMAX) 0.4 MG CAPS capsule [id number] Yes TAKE 1 CAPSULE (0.4 MG TOTAL) BY MOUTH DAILY. [name], MD Taking Active            Assessment:   Goals Addressed            This Visit's Progress     Patient Stated   . "I want to get my blood sugars under control" (pt-stated)       Current Barriers:  . Diabetes: uncontrolled, though improved per SMBG; most recent A1c 12.1%  . Current antihyperglycemic regimen: metformin 500 mg BID (written for 1000 mg BID, but patient believes he is only taking 1 tab BID); Ozempic 0.25 mg once weekly (x 3 doses) o Wife, [name], manages his medications and places in a BID pill box. He notes she is a Marine scientist.  o Notes he takes Ozempic every Sunday . Denies hypoglycemic symptoms, including dizziness, lightheadedness, shaking, sweating . Current meal patterns: o Breakfast: Breakfast packets (Just Add Egg); coffee + splenda; maybe glass of tomato juice o Lunch: Often skips o Supper: Daughter makes soup frequently (vegetable beef); sometimes frozen meal dinners o Snacks: Sometimes pack of nabs, daughter makes smoothies (fresh fruit);  o Drinks: diet sodas  . Current exercise: Helping out with building swing/fort set for his granddaughters in [place]  . Current blood glucose readings: Fasting: 100s-110s; sometimes in the evening: 110s  . Cardiovascular risk reduction: o Current hypertensive regimen: lisinopril 10/12.5 mg daily o Current hyperlipidemia regimen: atorvastatin 20 mg daily   Pharmacist Clinical Goal(s):  Marland Kitchen Over the next 90 days, patient will work with PharmD and primary care provider to address optimized glycemic management  Interventions: . Comprehensive medication review performed, medication list updated in electronic medical record . Reviewed  medications with patient. He notes that he will discuss his list with [name] to ensure it is up to date . Encouraged to increase to metformin 1000 mg BID. Providing titration instructions in MyChart message.  . Encouraged patient to continue Ozempic titration as scheduled.   Patient Self Care Activities:  . Patient will check blood glucose daily , document, and provide at future appointments . Patient will take medications as prescribed . Patient will report any questions or concerns to provider   Initial goal documentation        Plan: - Will outreach patient in ~4-5 weeks for continued medication management support  Catie Darnelle Maffucci, PharmD, [place] Pharmacist [place] [phone]

## 2019-09-15 NOTE — Patient Instructions (Signed)
Visit Information  Goals Addressed            This Visit's Progress     Patient Stated   . "I want to get my blood sugars under control" (pt-stated)       Current Barriers:  . Diabetes: uncontrolled, though improved per SMBG; most recent A1c 12.1%  . Current antihyperglycemic regimen: metformin 500 mg BID (written for 1000 mg BID, but patient believes he is only taking 1 tab BID); Ozempic 0.25 mg once weekly (x 3 doses) o Wife, [name], manages his medications and places in a BID pill box. He notes she is a Marine scientist.  o Notes he takes Ozempic every Sunday . Denies hypoglycemic symptoms, including dizziness, lightheadedness, shaking, sweating . Current meal patterns: o Breakfast: Breakfast packets (Just Add Egg); coffee + splenda; maybe glass of tomato juice o Lunch: Often skips o Supper: Daughter makes soup frequently (vegetable beef); sometimes frozen meal dinners o Snacks: Sometimes pack of nabs, daughter makes smoothies (fresh fruit);  o Drinks: diet sodas  . Current exercise: Helping out with building swing/fort set for his granddaughters in [place]  . Current blood glucose readings: Fasting: 100s-110s; sometimes in the evening: 110s  . Cardiovascular risk reduction: o Current hypertensive regimen: lisinopril 10/12.5 mg daily o Current hyperlipidemia regimen: atorvastatin 20 mg daily   Pharmacist Clinical Goal(s):  Marland Kitchen Over the next 90 days, patient will work with PharmD and primary care provider to address optimized glycemic management  Interventions: . Comprehensive medication review performed, medication list updated in electronic medical record . Reviewed medications with patient. He notes that he will discuss his list with [name] to ensure it is up to date . Encouraged to increase to metformin 1000 mg BID. Providing titration instructions in MyChart message.  . Encouraged patient to continue Ozempic titration as scheduled.   Patient Self Care Activities:  . Patient will  check blood glucose daily , document, and provide at future appointments . Patient will take medications as prescribed . Patient will report any questions or concerns to provider   Initial goal documentation        The patient verbalized understanding of instructions provided today and declined a print copy of patient instruction materials.   Plan: - Will outreach patient in ~4-5 weeks for continued medication management support  Catie Darnelle Maffucci, PharmD, [place] Pharmacist [place] [phone]

## 2019-09-16 ENCOUNTER — Encounter: Payer: Self-pay | Admitting: Family Medicine

## 2019-09-16 ENCOUNTER — Other Ambulatory Visit: Payer: Self-pay

## 2019-09-16 ENCOUNTER — Ambulatory Visit (INDEPENDENT_AMBULATORY_CARE_PROVIDER_SITE_OTHER): Payer: No Typology Code available for payment source | Admitting: Family Medicine

## 2019-09-16 VITALS — Ht 71.0 in | Wt 230.0 lb

## 2019-09-16 DIAGNOSIS — E669 Obesity, unspecified: Secondary | ICD-10-CM

## 2019-09-16 DIAGNOSIS — I10 Essential (primary) hypertension: Secondary | ICD-10-CM

## 2019-09-16 DIAGNOSIS — E1169 Type 2 diabetes mellitus with other specified complication: Secondary | ICD-10-CM | POA: Diagnosis not present

## 2019-09-16 DIAGNOSIS — E785 Hyperlipidemia, unspecified: Secondary | ICD-10-CM

## 2019-09-16 DIAGNOSIS — F330 Major depressive disorder, recurrent, mild: Secondary | ICD-10-CM

## 2019-09-16 MED ORDER — ATORVASTATIN CALCIUM 40 MG PO TABS: 40.0000 mg | ORAL_TABLET | Freq: Every day | ORAL | 1 refills | Status: DC

## 2019-09-16 NOTE — Assessment & Plan Note (Addendum)
Stable.  He will transition to Lexapro and monitor for any worsening symptoms.

## 2019-09-16 NOTE — Progress Notes (Signed)
Virtual Visit via telephone Note  This visit type was conducted due to national recommendations for restrictions regarding the COVID-19 pandemic (e.g. social distancing).  This format is felt to be most appropriate for this patient at this time.  All issues noted in this document were discussed and addressed.  No physical exam was performed (except for noted visual exam findings with Video Visits).   I connected with Timothy Edwards today at 10:30 AM EDT by telephone and verified that I am speaking with the correct person using two identifiers. Location patient: home Location provider: work  Persons participating in the virtual visit: patient, provider  I discussed the limitations, risks, security and privacy concerns of performing an evaluation and management service by telephone and the availability of in person appointments. I also discussed with the patient that there may be a patient responsible charge related to this service. The patient expressed understanding and agreed to proceed.  Interactive audio and video telecommunications were attempted between this provider and patient, however failed, due to patient having technical difficulties OR patient did not have access to video capability.  We continued and completed visit with audio only.   Reason for visit: f/u  HPI: DIABETES Disease Monitoring: Blood Sugar ranges-low 100s Polyuria/phagia/dipsia- no      Optho- due Medications: Compliance- taking ozempic and metformin Hypoglycemic symptoms- no  HYPERTENSION  Disease Monitoring  Home BP Monitoring 130/80 Chest pain- no    Dyspnea- no Medications  Compliance-  Taking lisinopril/hctz.  Edema- no  HLD: taking lipitor 20 mg daily. Last LDL above goal.  Depression: denies depression at this time. No SI.  Continues on Celexa though he will be switching to Lexapro soon.  He is taking Wellbutrin as well.      ROS: See pertinent positives and negatives per HPI.  Past Medical  History:  Diagnosis Date  . Depression   . Diabetes mellitus without complication ([place])   . Hyperlipidemia   . Hypertension   . Unspecified hypothyroidism   . Vitamin D deficiency     Past Surgical History:  Procedure Laterality Date  . ESOPHAGOGASTRODUODENOSCOPY (EGD) WITH PROPOFOL N/A 07/15/2018   Procedure: ESOPHAGOGASTRODUODENOSCOPY (EGD) WITH PROPOFOL;  Surgeon: Lin Landsman, MD;  Location: [facility];  Service: Gastroenterology;  Laterality: N/A;  . FOOT SURGERY      Family History  Problem Relation Age of Onset  . Obesity Father   . Aneurysm Father   . Hypertension Father   . Gallbladder disease Mother     SOCIAL HX: Non-smoker.   Current Outpatient Medications:  .  atorvastatin (LIPITOR) 40 MG tablet, Take 1 tablet (40 mg total) by mouth daily., Disp: 90 tablet, Rfl: 1 .  Blood Glucose Monitoring Suppl (ONE TOUCH BASIC SYSTEM) W/DEVICE KIT, To use to check blood sugars once daily, Disp: 1 each, Rfl: 0 .  buPROPion (WELLBUTRIN XL) 300 MG 24 hr tablet, TAKE 1 TABLET (300 MG TOTAL) BY MOUTH DAILY., Disp: 30 tablet, Rfl: 1 .  cholecalciferol (VITAMIN D) 1000 UNITS tablet, Take 1,000 Units by mouth daily.  , Disp: , Rfl:  .  clobetasol ointment (TEMOVATE) 0.05 %, Apply topically 2 (two) times daily., Disp: 30 g, Rfl: 0 .  escitalopram (LEXAPRO) 10 MG tablet, Take 1 tablet (10 mg total) by mouth daily., Disp: 90 tablet, Rfl: 1 .  fluticasone (FLONASE) 50 MCG/ACT nasal spray, Place 2 sprays into both nostrils daily., Disp: 16 g, Rfl: 6 .  glucose blood test strip, To use to check  blood sugar once daily, Disp: 100 each, Rfl: 12 .  levothyroxine (SYNTHROID) 50 MCG tablet, TAKE 1 TABLET (50 MCG TOTAL) BY MOUTH DAILY., Disp: 90 tablet, Rfl: 0 .  lisinopril-hydrochlorothiazide (ZESTORETIC) 10-12.5 MG tablet, TAKE 1 TABLET BY MOUTH DAILY., Disp: 90 tablet, Rfl: 0 .  meloxicam (MOBIC) 15 MG tablet, TAKE 1 TABLET BY MOUTH DAILY AS NEEDED FOR PAIN, Disp: 30 tablet, Rfl: 1 .   metFORMIN (GLUCOPHAGE) 500 MG tablet, Take 2 tablets in AM and 1 in PM for 7 days, then increase to 2 tablets in AM and 2 tablets in PM, Disp: 360 tablet, Rfl: 1 .  omeprazole (PRILOSEC) 40 MG capsule, TAKE 1 CAPSULE BY MOUTH 2 TIMES DAILY BEFORE A MEAL., Disp: 180 capsule, Rfl: 0 .  ONE TOUCH LANCETS MISC, To use to check blood sugar once daily, Disp: 100 each, Rfl: 3 .  Semaglutide,0.25 or 0.5MG/DOS, (OZEMPIC, 0.25 OR 0.5 MG/DOSE,) 2 MG/1.5ML SOPN, Inject 0.25 mg into the skin once a week for 30 days, THEN 0.5 mg once a week., Disp: 4 pen, Rfl: 1 .  tamsulosin (FLOMAX) 0.4 MG CAPS capsule, TAKE 1 CAPSULE (0.4 MG TOTAL) BY MOUTH DAILY., Disp: 30 capsule, Rfl: 3  EXAM: This was a telehealth telephone visit and thus no physical exam was completed.  ASSESSMENT AND PLAN:  Discussed the following assessment and plan:  Hypertension Reports well-controlled at home.  We will continue his current regimen.  Diabetes mellitus type 2 in obese CBGs have improved significantly.  We will continue his current regimen.  Depression Stable.  He will transition to Lexapro and monitor for any worsening symptoms.  Hyperlipidemia Increase Lipitor.  Follow-up labs in 6 weeks.    I discussed the assessment and treatment plan with the patient. The patient was provided an opportunity to ask questions and all were answered. The patient agreed with the plan and demonstrated an understanding of the instructions.   The patient was advised to call back or seek an in-person evaluation if the symptoms worsen or if the condition fails to improve as anticipated.  I provided 12 minutes of non-face-to-face time during this encounter.   [name], MD

## 2019-09-16 NOTE — Assessment & Plan Note (Signed)
CBGs have improved significantly.  We will continue his current regimen.

## 2019-09-16 NOTE — Assessment & Plan Note (Signed)
Reports well-controlled at home.  We will continue his current regimen.

## 2019-09-16 NOTE — Assessment & Plan Note (Signed)
Increase Lipitor.  Follow-up labs in 6 weeks.

## 2019-10-07 ENCOUNTER — Other Ambulatory Visit: Payer: Self-pay | Admitting: Family Medicine

## 2019-10-23 ENCOUNTER — Ambulatory Visit: Payer: Self-pay | Admitting: Pharmacist

## 2019-10-23 ENCOUNTER — Telehealth: Payer: No Typology Code available for payment source

## 2019-10-23 DIAGNOSIS — E1169 Type 2 diabetes mellitus with other specified complication: Secondary | ICD-10-CM

## 2019-10-23 NOTE — Progress Notes (Signed)
Reviewed.  Agree with assessment and plan.    Dr Amori Cooperman 

## 2019-10-23 NOTE — Chronic Care Management (AMB) (Addendum)
Chronic Care Management   Follow Up Note   10/23/2019 Name: Timothy Edwards MRN: [id number] DOB: 09/15/1946  Referred by: Timothy Haven, MD Reason for referral : Chronic Care Management (Medication Management)   Timothy Edwards is a 73 y.o. year old male who is a primary care patient of Timothy Edwards, Timothy Adam, MD. The CCM team was consulted for assistance with chronic disease management and care coordination needs.    Contacted patient for medication management review today.   Review of patient status, including review of consultants reports, relevant laboratory and other test results, and collaboration with appropriate care team members and the patient's provider was performed as part of comprehensive patient evaluation and provision of chronic care management services.    SDOH (Social Determinants of Health) screening performed today: Physical Activity. See Care Plan for related entries.   Outpatient Encounter Medications as of 10/23/2019  Medication Sig Note  . atorvastatin (LIPITOR) 40 MG tablet Take 1 tablet (40 mg total) by mouth daily.   . Blood Glucose Monitoring Suppl ([place]) W/DEVICE KIT To use to check blood sugars once daily   . buPROPion (WELLBUTRIN XL) 300 MG 24 hr tablet TAKE 1 TABLET (300 MG TOTAL) BY MOUTH DAILY.   . cholecalciferol (VITAMIN D) 1000 UNITS tablet Take 1,000 Units by mouth daily.     . clobetasol ointment (TEMOVATE) 0.05 % Apply topically 2 (two) times daily.   Marland Kitchen escitalopram (LEXAPRO) 10 MG tablet Take 1 tablet (10 mg total) by mouth daily.   . fluticasone (FLONASE) 50 MCG/ACT nasal spray Place 2 sprays into both nostrils daily.   Marland Kitchen glucose blood test strip To use to check blood sugar once daily   . levothyroxine (SYNTHROID) 50 MCG tablet TAKE 1 TABLET (50 MCG TOTAL) BY MOUTH DAILY.   Marland Kitchen lisinopril-hydrochlorothiazide (ZESTORETIC) 10-12.5 MG tablet TAKE 1 TABLET BY MOUTH DAILY.   . meloxicam (MOBIC) 15 MG tablet TAKE 1 TABLET  BY MOUTH DAILY AS NEEDED FOR PAIN   . metFORMIN (GLUCOPHAGE) 500 MG tablet Take 2 tablets in AM and 1 in PM for 7 days, then increase to 2 tablets in AM and 2 tablets in PM 09/15/2019: Taking 500 mg BID  . omeprazole (PRILOSEC) 40 MG capsule TAKE 1 CAPSULE BY MOUTH 2 TIMES DAILY BEFORE A MEAL.   Marland Kitchen ONE TOUCH LANCETS MISC To use to check blood sugar once daily   . Semaglutide,0.25 or 0.5MG/DOS, (OZEMPIC, 0.25 OR 0.5 MG/DOSE,) 2 MG/1.5ML SOPN Inject 0.25 mg into the skin once a week for 30 days, THEN 0.5 mg once a week.   . tamsulosin (FLOMAX) 0.4 MG CAPS capsule TAKE 1 CAPSULE (0.4 MG TOTAL) BY MOUTH DAILY.    No facility-administered encounter medications on file as of 10/23/2019.      Goals Addressed            This Visit's Progress     Patient Stated   . "I want to get my blood sugars under control" (pt-stated)       Current Barriers:  . Diabetes: uncontrolled, though improved per SMBG; most recent A1c 12.1%  o Notes he weights ~220 lbs   . Current antihyperglycemic regimen: metformin 1000 mg BID; Ozempic 0.5 mg weekly (~ o Wife, Timothy Edwards, manages his medications and places in a BID pill box. He notes she is a Marine scientist.  o Notes he takes Ozempic every Sunday . Denies hypoglycemic symptoms, including dizziness, lightheadedness, shaking, sweating . Current exercise: Helping out with building  swing/fort set for his granddaughters in [place]  . Current blood glucose readings:  o Random throughout the day- readings in the 120s; reports not checking after meal sugars as often because he doesn't want to feel  . Cardiovascular risk reduction: o Current hypertensive regimen: lisinopril 10/12.5 mg daily o Current hyperlipidemia regimen: atorvastatin 40 mg daily; recent increased by Dr. Caryl Edwards  Pharmacist Clinical Goal(s):  Marland Kitchen Over the next 90 days, patient will work with PharmD and primary care provider to address optimized glycemic management  Interventions: . Comprehensive medication  review performed, medication list updated in electronic medical record . Extensive discussion of goal A1c, fasting glucose, and goal 2 hour post prandial glucoses  . Congratulated patient on continued weight loss.  Marland Kitchen Extensive discussion of carbohydrate modification. . Discussed increased dose of atorvastatin and upcoming lab work. Patient denies any intolerability concerns.  . Consider increasing Ozempic to 1 mg weekly in the future, pending A1c result in January   Patient Self Care Activities:  . Patient will check blood glucose daily , document, and provide at future appointments . Patient will take medications as prescribed . Patient will report any questions or concerns to provider   Please see past updates related to this goal by clicking on the "Past Updates" button in the selected goal          Plan: - Will outreach patient in ~8 weeks, after labwork and f/u with Dr. Moshe Edwards, PharmD, CPP Clinical Pharmacist [place] [phone]

## 2019-10-23 NOTE — Patient Instructions (Signed)
Visit Information  Goals Addressed            This Visit's Progress     Patient Stated   . "I want to get my blood sugars under control" (pt-stated)       Current Barriers:  . Diabetes: uncontrolled, though improved per SMBG; most recent A1c 12.1%  o Notes he weights ~220 lbs   . Current antihyperglycemic regimen: metformin 1000 mg BID; Ozempic 0.5 mg weekly (~ o Wife, [name], manages his medications and places in a BID pill box. He notes she is a Marine scientist.  o Notes he takes Ozempic every Sunday . Denies hypoglycemic symptoms, including dizziness, lightheadedness, shaking, sweating . Current exercise: Helping out with building swing/fort set for his granddaughters in [place]  . Current blood glucose readings:  o Random throughout the day- readings in the 120s; reports not checking after meal sugars as often because he doesn't want to feel  . Cardiovascular risk reduction: o Current hypertensive regimen: lisinopril 10/12.5 mg daily o Current hyperlipidemia regimen: atorvastatin 40 mg daily; recent increased by Dr. [name]  Pharmacist Clinical Goal(s):  Marland Kitchen Over the next 90 days, patient will work with PharmD and primary care provider to address optimized glycemic management  Interventions: . Comprehensive medication review performed, medication list updated in electronic medical record . Extensive discussion of goal A1c, fasting glucose, and goal 2 hour post prandial glucoses  . Congratulated patient on continued weight loss.  Marland Kitchen Extensive discussion of carbohydrate modification. . Discussed increased dose of atorvastatin and upcoming lab work. Patient denies any intolerability concerns.  . Consider increasing Ozempic to 1 mg weekly in the future, pending A1c result in January   Patient Self Care Activities:  . Patient will check blood glucose daily , document, and provide at future appointments . Patient will take medications as prescribed . Patient will report any questions or  concerns to provider   Please see past updates related to this goal by clicking on the "Past Updates" button in the selected goal         The patient verbalized understanding of instructions provided today and declined a print copy of patient instruction materials.   Plan: - Will outreach patient in ~8 weeks, after labwork and f/u with Dr. Moshe Salisbury, PharmD, [place] Pharmacist [place] [phone]

## 2019-10-27 ENCOUNTER — Other Ambulatory Visit: Payer: Self-pay

## 2019-10-29 ENCOUNTER — Other Ambulatory Visit: Payer: Self-pay

## 2019-10-29 ENCOUNTER — Other Ambulatory Visit (INDEPENDENT_AMBULATORY_CARE_PROVIDER_SITE_OTHER): Payer: No Typology Code available for payment source

## 2019-10-29 DIAGNOSIS — E785 Hyperlipidemia, unspecified: Secondary | ICD-10-CM

## 2019-10-29 LAB — HEPATIC FUNCTION PANEL
ALT: 27 U/L (ref 0–53)
AST: 24 U/L (ref 0–37)
Albumin: 4.2 g/dL (ref 3.5–5.2)
Alkaline Phosphatase: 71 U/L (ref 39–117)
Bilirubin, Direct: 0.2 mg/dL (ref 0.0–0.3)
Total Bilirubin: 0.7 mg/dL (ref 0.2–1.2)
Total Protein: 6.7 g/dL (ref 6.0–8.3)

## 2019-10-29 LAB — LDL CHOLESTEROL, DIRECT: Direct LDL: 48 mg/dL

## 2019-11-03 ENCOUNTER — Other Ambulatory Visit: Payer: Self-pay | Admitting: Family Medicine

## 2019-11-10 ENCOUNTER — Other Ambulatory Visit: Payer: Self-pay | Admitting: Family Medicine

## 2019-11-17 ENCOUNTER — Other Ambulatory Visit: Payer: Self-pay | Admitting: Family Medicine

## 2019-11-20 ENCOUNTER — Telehealth: Payer: No Typology Code available for payment source

## 2019-11-24 ENCOUNTER — Other Ambulatory Visit: Payer: Self-pay | Admitting: Family Medicine

## 2019-12-22 ENCOUNTER — Encounter: Payer: Self-pay | Admitting: Family Medicine

## 2019-12-22 ENCOUNTER — Other Ambulatory Visit: Payer: Self-pay

## 2019-12-22 ENCOUNTER — Ambulatory Visit (INDEPENDENT_AMBULATORY_CARE_PROVIDER_SITE_OTHER): Payer: No Typology Code available for payment source | Admitting: Family Medicine

## 2019-12-22 DIAGNOSIS — E785 Hyperlipidemia, unspecified: Secondary | ICD-10-CM

## 2019-12-22 DIAGNOSIS — I10 Essential (primary) hypertension: Secondary | ICD-10-CM

## 2019-12-22 DIAGNOSIS — R351 Nocturia: Secondary | ICD-10-CM

## 2019-12-22 DIAGNOSIS — E669 Obesity, unspecified: Secondary | ICD-10-CM

## 2019-12-22 DIAGNOSIS — N401 Enlarged prostate with lower urinary tract symptoms: Secondary | ICD-10-CM | POA: Diagnosis not present

## 2019-12-22 DIAGNOSIS — E1169 Type 2 diabetes mellitus with other specified complication: Secondary | ICD-10-CM

## 2019-12-22 DIAGNOSIS — E663 Overweight: Secondary | ICD-10-CM

## 2019-12-22 MED ORDER — LISINOPRIL-HYDROCHLOROTHIAZIDE 10-12.5 MG PO TABS: 1.0000 | ORAL_TABLET | Freq: Every day | ORAL | 1 refills | Status: DC

## 2019-12-22 NOTE — Assessment & Plan Note (Signed)
Weight has trended down nicely.  I suspect the Ozempic is playing a role.  He will continue with dietary changes.

## 2019-12-22 NOTE — Progress Notes (Signed)
Virtual Visit via telephone Note  This visit type was conducted due to national recommendations for restrictions regarding the COVID-19 pandemic (e.g. social distancing).  This format is felt to be most appropriate for this patient at this time.  All issues noted in this document were discussed and addressed.  No physical exam was performed (except for noted visual exam findings with Video Visits).   I connected with Merleen Nicely today at 10:00 AM EST by telephone and verified that I am speaking with the correct person using two identifiers. Location patient: home Location provider: work  Persons participating in the virtual visit: patient, provider  I discussed the limitations, risks, security and privacy concerns of performing an evaluation and management service by telephone and the availability of in person appointments. I also discussed with the patient that there may be a patient responsible charge related to this service. The patient expressed understanding and agreed to proceed.  Interactive audio and video telecommunications were attempted between this provider and patient, however failed, due to patient having technical difficulties OR patient did not have access to video capability.  We continued and completed visit with audio only.   Reason for visit: follow-up  HPI: HYPERTENSION Disease Monitoring: Blood pressure [id number] systolic Chest pain- no      Dyspnea- no Medications: Compliance- taking lisinopril/hctz   Edema- no  DIABETES Disease Monitoring: Blood Sugar ranges-96-low 100s, 120's-130s after eating Polyuria/phagia/dipsia- some polyuria      Optho- due Medications: Compliance- taking ozempic, metformin Hypoglycemic symptoms- no  HYPERLIPIDEMIA Disease Monitoring: See symptoms for Hypertension Medications: Compliance- taking lipitor Right upper quadrant pain- no  Muscle aches- no  BPH: Strain- no Flow- good Frequency- yes Urgency- yes Emptying bladder-  yes Medication- flomax  Obesity: has lost significant weight. Appetite down with ozempic. He feels better. Able to be more active. Not as many sugary snacks.   ROS: See pertinent positives and negatives per HPI.  Past Medical History:  Diagnosis Date  . Depression   . Diabetes mellitus without complication ([place])   . Hyperlipidemia   . Hypertension   . Unspecified hypothyroidism   . Vitamin D deficiency     Past Surgical History:  Procedure Laterality Date  . ESOPHAGOGASTRODUODENOSCOPY (EGD) WITH PROPOFOL N/A 07/15/2018   Procedure: ESOPHAGOGASTRODUODENOSCOPY (EGD) WITH PROPOFOL;  Surgeon: Lin Landsman, MD;  Location: [facility];  Service: Gastroenterology;  Laterality: N/A;  . FOOT SURGERY      Family History  Problem Relation Age of Onset  . Obesity Father   . Aneurysm Father   . Hypertension Father   . Gallbladder disease Mother     SOCIAL HX: nonsmoker   Current Outpatient Medications:  .  atorvastatin (LIPITOR) 40 MG tablet, Take 1 tablet (40 mg total) by mouth daily., Disp: 90 tablet, Rfl: 1 .  Blood Glucose Monitoring Suppl (ONE TOUCH BASIC SYSTEM) W/DEVICE KIT, To use to check blood sugars once daily, Disp: 1 each, Rfl: 0 .  buPROPion (WELLBUTRIN XL) 300 MG 24 hr tablet, TAKE 1 TABLET (300 MG TOTAL) BY MOUTH DAILY., Disp: 90 tablet, Rfl: 1 .  cholecalciferol (VITAMIN D) 1000 UNITS tablet, Take 1,000 Units by mouth daily.  , Disp: , Rfl:  .  clobetasol ointment (TEMOVATE) 0.05 %, Apply topically 2 (two) times daily., Disp: 30 g, Rfl: 0 .  escitalopram (LEXAPRO) 10 MG tablet, Take 1 tablet (10 mg total) by mouth daily., Disp: 90 tablet, Rfl: 1 .  fluticasone (FLONASE) 50 MCG/ACT nasal spray, Place 2 sprays  into both nostrils daily., Disp: 16 g, Rfl: 6 .  glucose blood test strip, To use to check blood sugar once daily, Disp: 100 each, Rfl: 12 .  levothyroxine (SYNTHROID) 50 MCG tablet, TAKE 1 TABLET (50 MCG TOTAL) BY MOUTH DAILY., Disp: 90 tablet, Rfl: 0 .   lisinopril-hydrochlorothiazide (ZESTORETIC) 10-12.5 MG tablet, Take 1 tablet by mouth daily., Disp: 90 tablet, Rfl: 1 .  meloxicam (MOBIC) 15 MG tablet, TAKE 1 TABLET BY MOUTH DAILY AS NEEDED FOR PAIN, Disp: 30 tablet, Rfl: 1 .  metFORMIN (GLUCOPHAGE) 500 MG tablet, Take 2 tablets in AM and 1 in PM for 7 days, then increase to 2 tablets in AM and 2 tablets in PM, Disp: 360 tablet, Rfl: 1 .  omeprazole (PRILOSEC) 40 MG capsule, TAKE 1 CAPSULE BY MOUTH 2 TIMES DAILY BEFORE A MEAL., Disp: 180 capsule, Rfl: 0 .  ONE TOUCH LANCETS MISC, To use to check blood sugar once daily, Disp: 100 each, Rfl: 3 .  Semaglutide,0.25 or 0.5MG/DOS, (OZEMPIC, 0.25 OR 0.5 MG/DOSE,) 2 MG/1.5ML SOPN, Inject 0.25 mg into the skin once a week for 30 days, THEN 0.5 mg once a week., Disp: 4 pen, Rfl: 1 .  tamsulosin (FLOMAX) 0.4 MG CAPS capsule, TAKE 1 CAPSULE (0.4 MG TOTAL) BY MOUTH DAILY., Disp: 30 capsule, Rfl: 3  EXAM:  VITALS per patient if applicable:  GENERAL: alert, oriented, appears well and in no acute distress  HEENT: atraumatic, conjunttiva clear, no obvious abnormalities on inspection of external nose and ears  NECK: normal movements of the head and neck  LUNGS: on inspection no signs of respiratory distress, breathing rate appears normal, no obvious gross SOB, gasping or wheezing  CV: no obvious cyanosis  MS: moves all visible extremities without noticeable abnormality  PSYCH/NEURO: pleasant and cooperative, no obvious depression or anxiety, speech and thought processing grossly intact  ASSESSMENT AND PLAN:  Discussed the following assessment and plan:  Hypertension Seems to be well controlled. He will continue with his current medication.  Diabetes mellitus type 2 in obese Seems to be much better. We will have him in for an A1c. Continue current regimen.   Hyperlipidemia At goal. Continue lipitor.   BPH (benign prostatic hyperplasia) Some frequency and urgency.  He will trial a higher dose  of Flomax with 2 capsules once daily.  Discussed that if this is beneficial he can continue with that regimen.  If it is not beneficial he will go back to 1 capsule once daily.  Discussed potential for orthostasis and he will let us know if this occurs.  Overweight Weight has trended down nicely.  I suspect the Ozempic is playing a role.  He will continue with dietary changes.   Orders Placed This Encounter  Procedures  . POCT HgB A1C    Standing Status:   Future    Standing Expiration Date:   01/22/2020    Meds ordered this encounter  Medications  . lisinopril-hydrochlorothiazide (ZESTORETIC) 10-12.5 MG tablet    Sig: Take 1 tablet by mouth daily.    Dispense:  90 tablet    Refill:  1     I discussed the assessment and treatment plan with the patient. The patient was provided an opportunity to ask questions and all were answered. The patient agreed with the plan and demonstrated an understanding of the instructions.   The patient was advised to call back or seek an in-person evaluation if the symptoms worsen or if the condition fails to improve as anticipated.  I provided 21 minutes of non-face-to-face time during this encounter.   [name], MD

## 2019-12-22 NOTE — Assessment & Plan Note (Signed)
At goal  Continue lipitor

## 2019-12-22 NOTE — Assessment & Plan Note (Signed)
Seems to be much better. We will have him in for an A1c. Continue current regimen.

## 2019-12-22 NOTE — Assessment & Plan Note (Signed)
Seems to be well controlled. He will continue with his current medication.

## 2019-12-22 NOTE — Assessment & Plan Note (Signed)
Some frequency and urgency.  He will trial a higher dose of Flomax with 2 capsules once daily.  Discussed that if this is beneficial he can continue with that regimen.  If it is not beneficial he will go back to 1 capsule once daily.  Discussed potential for orthostasis and he will let us know if this occurs.

## 2019-12-29 ENCOUNTER — Telehealth: Payer: No Typology Code available for payment source

## 2019-12-29 ENCOUNTER — Ambulatory Visit: Payer: Self-pay | Admitting: Pharmacist

## 2019-12-29 NOTE — Chronic Care Management (AMB) (Signed)
  Chronic Care Management   Note  12/29/2019 Name: Timothy Edwards MRN: [id number] DOB: June 12, 1946  Timothy Edwards is a 74 y.o. year old male who is a primary care patient of Birdie Sons, Yehuda Mao, MD. The CCM team was consulted for assistance with chronic disease management and care coordination needs.    Attempted to contact patient for medication management review. Left HIPPA compliant message for him to return my call at his convenience.   Follow up plan: - Will collaborate w/ Care Guide to attempt to outreach and reschedule f/u with me  Catie Feliz Beam, PharmD, [place], CPP Clinical Pharmacist [facility] [employer] [phone]

## 2019-12-30 ENCOUNTER — Telehealth: Payer: Self-pay | Admitting: Family Medicine

## 2019-12-30 NOTE — Chronic Care Management (AMB) (Signed)
  Care Management   Note  12/30/2019 Name: Timothy Edwards MRN: [id number] DOB: 1946/07/28  Timothy Edwards is a 74 y.o. year old male who is a primary care patient of Glori Luis, MD and is actively engaged with the care management team. I reached out to Oren Bracket by phone today to assist with re-scheduling a follow up visit with the Pharmacist  Follow up plan: Telephone appointment with care management team member scheduled for: 01/15/2020  [facility] Guide, Embedded Care Coordination [facility]: [address]  [place], [place] [address] Direct Dial: [phone] [email or web address]  [email or web address]: [email or web address]

## 2019-12-31 ENCOUNTER — Ambulatory Visit: Payer: No Typology Code available for payment source

## 2020-01-01 ENCOUNTER — Ambulatory Visit: Payer: No Typology Code available for payment source

## 2020-01-05 ENCOUNTER — Other Ambulatory Visit: Payer: Self-pay | Admitting: Family Medicine

## 2020-01-15 ENCOUNTER — Ambulatory Visit: Payer: Self-pay | Admitting: Pharmacist

## 2020-01-15 ENCOUNTER — Telehealth: Payer: No Typology Code available for payment source

## 2020-01-15 ENCOUNTER — Ambulatory Visit (INDEPENDENT_AMBULATORY_CARE_PROVIDER_SITE_OTHER): Payer: No Typology Code available for payment source | Admitting: Lab

## 2020-01-15 ENCOUNTER — Other Ambulatory Visit: Payer: Self-pay

## 2020-01-15 ENCOUNTER — Other Ambulatory Visit: Payer: Self-pay | Admitting: Family Medicine

## 2020-01-15 DIAGNOSIS — E669 Obesity, unspecified: Secondary | ICD-10-CM

## 2020-01-15 DIAGNOSIS — E1169 Type 2 diabetes mellitus with other specified complication: Secondary | ICD-10-CM | POA: Diagnosis not present

## 2020-01-15 LAB — POCT GLYCOSYLATED HEMOGLOBIN (HGB A1C)
HbA1c POC (<> result, manual entry): 5.6 % (ref 4.0–5.6)
HbA1c, POC (controlled diabetic range): 5.6 % (ref 0.0–7.0)
HbA1c, POC (prediabetic range): 5.6 % — AB (ref 5.7–6.4)
Hemoglobin A1C: 5.6 % (ref 4.0–5.6)

## 2020-01-15 NOTE — Chronic Care Management (AMB) (Signed)
  Chronic Care Management   Note  01/15/2020 Name: Bryler Dibble MRN: [id number] DOB: 1946-11-01  Nelda Marseille Hoar is a 74 y.o. year old male who is a primary care patient of Birdie Sons, Yehuda Mao, MD. The CCM team was consulted for assistance with chronic disease management and care coordination needs.   Attempted to contact patient for medication management review as scheduled. Left HIPAA compliant message for him to return my call at his convenience.   Follow up plan: - Will collaborate w/ Care Guide to outreach patient to schedule f/u with me.   Catie Feliz Beam, PharmD, [place], CPP Clinical Pharmacist [facility] [employer] [phone]

## 2020-01-15 NOTE — Progress Notes (Addendum)
Pt in office today fior A1c check which was 5.6.  Ok f/u with PCP

## 2020-01-16 ENCOUNTER — Telehealth: Payer: Self-pay | Admitting: Family Medicine

## 2020-01-16 NOTE — Chronic Care Management (AMB) (Signed)
  Care Management   Note  01/16/2020 Name: Jafet Wissing MRN: [id number] DOB: 09/13/1946  Nelda Marseille Sandy is a 74 y.o. year old male who is a primary care patient of Birdie Sons, Yehuda Mao, MD and is actively engaged with the care management team. I reached out to Oren Bracket by phone today to assist with re-scheduling a follow up visit with the Pharmacist  Follow up plan: Telephone appointment with care management team member scheduled for:03/01/2020  Penne Lash, RMA Care Guide, Embedded Care Coordination [facility]  [place], [place] [address] Direct Dial: [phone] [email or web address]: [email or web address]

## 2020-01-27 ENCOUNTER — Other Ambulatory Visit: Payer: Self-pay | Admitting: Family Medicine

## 2020-02-09 ENCOUNTER — Other Ambulatory Visit: Payer: Self-pay | Admitting: Family Medicine

## 2020-03-01 ENCOUNTER — Ambulatory Visit: Payer: No Typology Code available for payment source | Admitting: Pharmacist

## 2020-03-01 DIAGNOSIS — I10 Essential (primary) hypertension: Secondary | ICD-10-CM

## 2020-03-01 DIAGNOSIS — E119 Type 2 diabetes mellitus without complications: Secondary | ICD-10-CM

## 2020-03-01 DIAGNOSIS — E1169 Type 2 diabetes mellitus with other specified complication: Secondary | ICD-10-CM

## 2020-03-01 DIAGNOSIS — E785 Hyperlipidemia, unspecified: Secondary | ICD-10-CM

## 2020-03-01 NOTE — Patient Instructions (Signed)
Visit Information  Goals Addressed            This Visit's Progress     Patient Stated   . "I want to get my blood sugars under control" (pt-stated)       CARE PLAN ENTRY (see longtitudinal plan of care for additional care plan information)  Current Barriers:  . Diabetes: CONTROLLED; last A1c 5.6% o Reports being pleased with ~60 lbs weight loss . Current antihyperglycemic regimen: metformin 500 mg BID; Ozempic 0.5 mg weekly o Wife, [name], manages his medications and places in a BID pill box. He notes she is a Engineer, civil (consulting).  o Notes he takes Ozempic every Sunday . Does note sweating, shakiness if he has sugars <70 . Current blood glucose readings:  o Reports fastings 100-120s; occasionally <100s if he hasn't eaten . Cardiovascular risk reduction: o Current hypertensive regimen: lisinopril 10/12.5 mg daily; reports home BP 120/80s generally when his wife checks o Current hyperlipidemia regimen: atorvastatin 40 mg daily; last LDL at goal <70 . Mood: escitalopram 10 mg daily, bupropion XL 300 mg QAM; notes his mood is well controlled currently  . BPH: reports slow start to stream; tamsulosin 0.4 mg, he believes he is taking this medication BID; reports waking up 3-4 hours QPM  . GERD: omeprazole 40 mg BID . Hypothyroidism: levothyroxine 50 mcg QAM  Pharmacist Clinical Goal(s):  Marland Kitchen Over the next 90 days, patient will work with PharmD and primary care provider to address optimized glycemic management  Interventions: . Comprehensive medication review performed, medication list updated in electronic medical record . Praised patient for control of A1c. Reviewed goal fasting and post prandial blood sugars.  . Discussed patient's complaints of urinary hesitancy. He believes he's taking tamsulosin BID, though [name] manages his medications. I encouraged him to discuss w/ [name] and Dr. Birdie Sons at next appt in May. Discussed possibility for referral to urology.  . Reviewed goal BP and lipids.  Patient verbalizes understanding.  . Denies any other medication questions or concerns at this time.   Patient Self Care Activities:  . Patient will check blood glucose daily , document, and provide at future appointments . Patient will take medications as prescribed . Patient will report any questions or concerns to provider   Please see past updates related to this goal by clicking on the "Past Updates" button in the selected goal         Patient verbalizes understanding of instructions provided today.   Plan:  - Patient has my contact information for future questions or concerns.   Catie Feliz Beam, PharmD, [place], CPP Clinical Pharmacist [facility] [employer] [phone]

## 2020-03-01 NOTE — Chronic Care Management (AMB) (Signed)
Chronic Care Management   Follow Up Note   03/01/2020 Name: Timothy Edwards MRN: [id number] DOB: 30-Dec-1945  Referred by: Timothy Haven, MD Reason for referral : Chronic Care Management (Medication Management)   Timothy Edwards is a 74 y.o. year old male who is a primary care patient of Timothy Edwards, Timothy Adam, MD. The CCM team was consulted for assistance with chronic disease management and care coordination needs.    Contacted patient for medication management review.  Review of patient status, including review of consultants reports, relevant laboratory and other test results, and collaboration with appropriate care team members and the patient's provider was performed as part of comprehensive patient evaluation and provision of chronic care management services.    SDOH (Social Determinants of Health) assessments performed: No See Care Plan activities for detailed interventions related to [facility])     Outpatient Encounter Medications as of 03/01/2020  Medication Sig Note  . atorvastatin (LIPITOR) 40 MG tablet Take 1 tablet (40 mg total) by mouth daily.   . Blood Glucose Monitoring Suppl ([place]) W/DEVICE KIT To use to check blood sugars once daily   . buPROPion (WELLBUTRIN XL) 300 MG 24 hr tablet TAKE 1 TABLET (300 MG TOTAL) BY MOUTH DAILY.   . cholecalciferol (VITAMIN D) 1000 UNITS tablet Take 1,000 Units by mouth daily.     Marland Kitchen escitalopram (LEXAPRO) 10 MG tablet Take 1 tablet (10 mg total) by mouth daily.   Marland Kitchen glucose blood test strip To use to check blood sugar once daily   . levothyroxine (SYNTHROID) 50 MCG tablet TAKE 1 TABLET (50 MCG TOTAL) BY MOUTH DAILY.   Marland Kitchen lisinopril-hydrochlorothiazide (ZESTORETIC) 10-12.5 MG tablet Take 1 tablet by mouth daily.   . meloxicam (MOBIC) 15 MG tablet TAKE 1 TABLET BY MOUTH DAILY AS NEEDED FOR PAIN   . metFORMIN (GLUCOPHAGE) 500 MG tablet Take 2 tablets in AM and 1 in PM for 7 days, then increase to 2 tablets in AM and 2  tablets in PM 09/15/2019: Taking 500 mg BID  . omeprazole (PRILOSEC) 40 MG capsule TAKE 1 CAPSULE BY MOUTH 2 TIMES DAILY BEFORE A MEAL.   Marland Kitchen ONE TOUCH LANCETS MISC To use to check blood sugar once daily   . OZEMPIC, 0.25 OR 0.5 MG/DOSE, 2 MG/1.5ML SOPN Inject 0.5 mg into the skin once a week.   . tamsulosin (FLOMAX) 0.4 MG CAPS capsule TAKE 1 CAPSULE (0.4 MG TOTAL) BY MOUTH DAILY. 03/01/2020: Taking 2 QAM  . clobetasol ointment (TEMOVATE) 0.05 % Apply topically 2 (two) times daily.   . fluticasone (FLONASE) 50 MCG/ACT nasal spray Place 2 sprays into both nostrils daily. (Patient not taking: Reported on 03/01/2020)    No facility-administered encounter medications on file as of 03/01/2020.     Objective:   Goals Addressed            This Visit's Progress     Patient Stated   . "I want to get my blood sugars under control" (pt-stated)       CARE PLAN ENTRY (see longtitudinal plan of care for additional care plan information)  Current Barriers:  . Diabetes: CONTROLLED; last A1c 5.6% o Reports being pleased with ~60 lbs weight loss . Current antihyperglycemic regimen: metformin 500 mg BID; Ozempic 0.5 mg weekly o Wife, [name], manages his medications and places in a BID pill box. He notes she is a Marine scientist.  o Notes he takes Ozempic every Sunday . Does note sweating, shakiness if he  has sugars <70 . Current blood glucose readings:  o Reports fastings 100-120s; occasionally <100s if he hasn't eaten . Cardiovascular risk reduction: o Current hypertensive regimen: lisinopril 10/12.5 mg daily; reports home BP 120/80s generally when his wife checks o Current hyperlipidemia regimen: atorvastatin 40 mg daily; last LDL at goal <70 . Mood: escitalopram 10 mg daily, bupropion XL 300 mg QAM; notes his mood is well controlled currently  . BPH: reports slow start to stream; tamsulosin 0.4 mg, he believes he is taking this medication BID; reports waking up 3-4 hours QPM  . GERD: omeprazole 40 mg  BID . Hypothyroidism: levothyroxine 50 mcg QAM  Pharmacist Clinical Goal(s):  Marland Kitchen Over the next 90 days, patient will work with PharmD and primary care provider to address optimized glycemic management  Interventions: . Comprehensive medication review performed, medication list updated in electronic medical record . Praised patient for control of A1c. Reviewed goal fasting and post prandial blood sugars.  . Discussed patient's complaints of urinary hesitancy. He believes he's taking tamsulosin BID, though [name] manages his medications. I encouraged him to discuss w/ [name] and Dr. Caryl Edwards at next appt in May. Discussed possibility for referral to urology.  . Reviewed goal BP and lipids. Patient verbalizes understanding.  . Denies any other medication questions or concerns at this time.   Patient Self Care Activities:  . Patient will check blood glucose daily , document, and provide at future appointments . Patient will take medications as prescribed . Patient will report any questions or concerns to provider   Please see past updates related to this goal by clicking on the "Past Updates" button in the selected goal          Plan:  - Patient has my contact information for future questions or concerns.   Catie Darnelle Maffucci, PharmD, [place], CPP Clinical Pharmacist [place] [phone]

## 2020-03-02 ENCOUNTER — Other Ambulatory Visit: Payer: Self-pay | Admitting: Family Medicine

## 2020-03-02 DIAGNOSIS — E1169 Type 2 diabetes mellitus with other specified complication: Secondary | ICD-10-CM

## 2020-03-18 ENCOUNTER — Other Ambulatory Visit: Payer: Self-pay | Admitting: Family Medicine

## 2020-03-25 ENCOUNTER — Other Ambulatory Visit: Payer: Self-pay | Admitting: Family Medicine

## 2020-04-05 ENCOUNTER — Other Ambulatory Visit: Payer: Self-pay | Admitting: Family Medicine

## 2020-04-20 ENCOUNTER — Encounter: Payer: Self-pay | Admitting: Family Medicine

## 2020-04-20 ENCOUNTER — Other Ambulatory Visit: Payer: Self-pay

## 2020-04-20 ENCOUNTER — Telehealth (INDEPENDENT_AMBULATORY_CARE_PROVIDER_SITE_OTHER): Payer: No Typology Code available for payment source | Admitting: Family Medicine

## 2020-04-20 VITALS — Ht 71.0 in | Wt 200.0 lb

## 2020-04-20 DIAGNOSIS — E669 Obesity, unspecified: Secondary | ICD-10-CM

## 2020-04-20 DIAGNOSIS — E663 Overweight: Secondary | ICD-10-CM | POA: Diagnosis not present

## 2020-04-20 DIAGNOSIS — I10 Essential (primary) hypertension: Secondary | ICD-10-CM | POA: Diagnosis not present

## 2020-04-20 DIAGNOSIS — E1169 Type 2 diabetes mellitus with other specified complication: Secondary | ICD-10-CM

## 2020-04-20 NOTE — Assessment & Plan Note (Signed)
Seems to be adequately controlled for his age.  He will continue to monitor.  He will come in for a BMP.  He will continue his current medication.

## 2020-04-20 NOTE — Assessment & Plan Note (Signed)
Seems to be very well controlled.  Continue current regimen.  He will come in for an A1c.  I encouraged him to see an ophthalmologist for yearly eye exam.  Discussed the importance of this.

## 2020-04-20 NOTE — Assessment & Plan Note (Signed)
Encouraged continued dietary changes and activity. 

## 2020-04-20 NOTE — Progress Notes (Signed)
Virtual Visit via telephone Note  This visit type was conducted due to national recommendations for restrictions regarding the COVID-19 pandemic (e.g. social distancing).  This format is felt to be most appropriate for this patient at this time.  All issues noted in this document were discussed and addressed.  No physical exam was performed (except for noted visual exam findings with Video Visits).   I connected with Timothy Edwards today at  9:30 AM EDT by telephone and verified that I am speaking with the correct person using two identifiers. Location patient: home Location provider: work  Persons participating in the virtual visit: patient, provider  I discussed the limitations, risks, security and privacy concerns of performing an evaluation and management service by telephone and the availability of in person appointments. I also discussed with the patient that there may be a patient responsible charge related to this service. The patient expressed understanding and agreed to proceed.  Interactive audio and video telecommunications were attempted between this provider and patient, however failed, due to patient having technical difficulties OR patient did not have access to video capability.  We continued and completed visit with audio only.   Reason for visit: follow-up  HPI: DIABETES Disease Monitoring: Blood Sugar ranges-90s Polyuria/phagia/dipsia- no      Optho- due, patient will call to schedule Medications: Compliance- taking metformin and ozempic Hypoglycemic symptoms- no  HYPERTENSION  Disease Monitoring  Home BP Monitoring 135/66 Chest pain- no    Dyspnea- no Medications  Compliance-  Taking lisinopril/hctz.   Edema- no  Overweight: Patient notes he is lost more weight.  He is doing more exercise now that he feels better with less weight.  His appetite is slightly decreased with the Ozempic and that has helped.  He is cut down on portions and cut down on sugars.     Patient declines COVID-19 vaccine.  Notes he does not go anywhere to be exposed anybody.  ROS: See pertinent positives and negatives per HPI.  Past Medical History:  Diagnosis Date  . Depression   . Diabetes mellitus without complication ([place])   . Hyperlipidemia   . Hypertension   . Unspecified hypothyroidism   . Vitamin D deficiency     Past Surgical History:  Procedure Laterality Date  . ESOPHAGOGASTRODUODENOSCOPY (EGD) WITH PROPOFOL N/A 07/15/2018   Procedure: ESOPHAGOGASTRODUODENOSCOPY (EGD) WITH PROPOFOL;  Surgeon: Lin Landsman, MD;  Location: [facility];  Service: Gastroenterology;  Laterality: N/A;  . FOOT SURGERY      Family History  Problem Relation Age of Onset  . Obesity Father   . Aneurysm Father   . Hypertension Father   . Gallbladder disease Mother     SOCIAL HX: Non-smoker   Current Outpatient Medications:  .  atorvastatin (LIPITOR) 40 MG tablet, TAKE 1 TABLET BY MOUTH DAILY., Disp: 90 tablet, Rfl: 1 .  Blood Glucose Monitoring Suppl (ONE TOUCH BASIC SYSTEM) W/DEVICE KIT, To use to check blood sugars once daily, Disp: 1 each, Rfl: 0 .  buPROPion (WELLBUTRIN XL) 300 MG 24 hr tablet, TAKE 1 TABLET (300 MG TOTAL) BY MOUTH DAILY., Disp: 90 tablet, Rfl: 1 .  cholecalciferol (VITAMIN D) 1000 UNITS tablet, Take 1,000 Units by mouth daily.  , Disp: , Rfl:  .  clobetasol ointment (TEMOVATE) 0.05 %, Apply topically 2 (two) times daily., Disp: 30 g, Rfl: 0 .  escitalopram (LEXAPRO) 10 MG tablet, TAKE 1 TABLET BY MOUTH DAILY., Disp: 90 tablet, Rfl: 1 .  fluticasone (FLONASE) 50 MCG/ACT nasal  spray, Place 2 sprays into both nostrils daily., Disp: 16 g, Rfl: 6 .  glucose blood test strip, To use to check blood sugar once daily, Disp: 100 each, Rfl: 12 .  levothyroxine (SYNTHROID) 50 MCG tablet, TAKE 1 TABLET (50 MCG TOTAL) BY MOUTH DAILY., Disp: 90 tablet, Rfl: 0 .  lisinopril-hydrochlorothiazide (ZESTORETIC) 10-12.5 MG tablet, Take 1 tablet by mouth daily.,  Disp: 90 tablet, Rfl: 1 .  meloxicam (MOBIC) 15 MG tablet, TAKE 1 TABLET BY MOUTH DAILY AS NEEDED FOR PAIN, Disp: 30 tablet, Rfl: 1 .  metFORMIN (GLUCOPHAGE) 500 MG tablet, TAKE 2 TABLETS BY MOUTH IN AM AND 1 IN PM FOR 7 DAYS, THEN INCREASE TO 2 TABLETS IN AM AND 2 TABLETS IN PM, Disp: 360 tablet, Rfl: 1 .  omeprazole (PRILOSEC) 40 MG capsule, TAKE 1 CAPSULE BY MOUTH 2 TIMES DAILY BEFORE A MEAL., Disp: 180 capsule, Rfl: 0 .  ONE TOUCH LANCETS MISC, To use to check blood sugar once daily, Disp: 100 each, Rfl: 3 .  OZEMPIC, 0.25 OR 0.5 MG/DOSE, 2 MG/1.5ML SOPN, Inject 0.5 mg into the skin once a week., Disp: , Rfl:  .  tamsulosin (FLOMAX) 0.4 MG CAPS capsule, TAKE 1 CAPSULE (0.4 MG TOTAL) BY MOUTH DAILY., Disp: 30 capsule, Rfl: 3  EXAM:  VITALS per patient if applicable:  GENERAL: alert, oriented, appears well and in no acute distress  HEENT: atraumatic, conjunttiva clear, no obvious abnormalities on inspection of external nose and ears  NECK: normal movements of the head and neck  LUNGS: on inspection no signs of respiratory distress, breathing rate appears normal, no obvious gross SOB, gasping or wheezing  CV: no obvious cyanosis  MS: moves all visible extremities without noticeable abnormality  PSYCH/NEURO: pleasant and cooperative, no obvious depression or anxiety, speech and thought processing grossly intact  ASSESSMENT AND PLAN:  Discussed the following assessment and plan:  Hypertension Seems to be adequately controlled for his age.  He will continue to monitor.  He will come in for a BMP.  He will continue his current medication.  Diabetes mellitus type 2 in obese Seems to be very well controlled.  Continue current regimen.  He will come in for an A1c.  I encouraged him to see an ophthalmologist for yearly eye exam.  Discussed the importance of this.  Overweight Encouraged continued dietary changes and activity.   Orders Placed This Encounter  Procedures  . Hemoglobin  A1c    Standing Status:   Future    Standing Expiration Date:   04/20/2021  . Basic Metabolic Panel (BMET)    Standing Status:   Future    Standing Expiration Date:   04/20/2021    No orders of the defined types were placed in this encounter.    I discussed the assessment and treatment plan with the patient. The patient was provided an opportunity to ask questions and all were answered. The patient agreed with the plan and demonstrated an understanding of the instructions.   The patient was advised to call back or seek an in-person evaluation if the symptoms worsen or if the condition fails to improve as anticipated.  I provided 12 minutes of non-face-to-face time during this encounter.   [name], MD

## 2020-05-17 ENCOUNTER — Other Ambulatory Visit: Payer: Self-pay | Admitting: Family Medicine

## 2020-06-01 ENCOUNTER — Other Ambulatory Visit: Payer: Self-pay | Admitting: Family Medicine

## 2020-07-07 ENCOUNTER — Other Ambulatory Visit: Payer: Self-pay | Admitting: Family Medicine

## 2020-07-19 ENCOUNTER — Other Ambulatory Visit: Payer: Self-pay | Admitting: Family Medicine

## 2020-07-29 ENCOUNTER — Other Ambulatory Visit: Payer: Self-pay | Admitting: Family Medicine

## 2020-08-02 ENCOUNTER — Other Ambulatory Visit: Payer: Self-pay | Admitting: Family Medicine

## 2020-08-27 ENCOUNTER — Other Ambulatory Visit: Payer: Self-pay | Admitting: Family Medicine

## 2020-09-27 ENCOUNTER — Other Ambulatory Visit: Payer: Self-pay | Admitting: Family Medicine

## 2020-10-25 ENCOUNTER — Other Ambulatory Visit: Payer: Self-pay | Admitting: Family Medicine

## 2020-11-01 ENCOUNTER — Other Ambulatory Visit: Payer: Self-pay | Admitting: Family Medicine

## 2020-11-01 LAB — HM DIABETES EYE EXAM

## 2020-11-17 ENCOUNTER — Other Ambulatory Visit: Payer: Self-pay | Admitting: Family Medicine

## 2020-11-22 ENCOUNTER — Other Ambulatory Visit: Payer: Self-pay | Admitting: Family Medicine

## 2020-11-24 ENCOUNTER — Other Ambulatory Visit: Payer: Self-pay | Admitting: Family Medicine

## 2020-11-29 ENCOUNTER — Other Ambulatory Visit: Payer: Self-pay | Admitting: Internal Medicine

## 2021-01-17 ENCOUNTER — Other Ambulatory Visit: Payer: Self-pay | Admitting: Family Medicine

## 2021-02-14 ENCOUNTER — Other Ambulatory Visit: Payer: Self-pay | Admitting: Family Medicine

## 2021-02-15 ENCOUNTER — Other Ambulatory Visit: Payer: Self-pay | Admitting: Family Medicine

## 2021-03-10 ENCOUNTER — Other Ambulatory Visit: Payer: Self-pay | Admitting: Family Medicine

## 2021-03-10 MED FILL — Tamsulosin HCl Cap 0.4 MG: ORAL | 30 days supply | Qty: 30 | Fill #0 | Status: AC

## 2021-03-11 ENCOUNTER — Other Ambulatory Visit: Payer: Self-pay

## 2021-03-11 MED ORDER — MELOXICAM 15 MG PO TABS
ORAL_TABLET | Freq: Every day | ORAL | 1 refills | Status: DC | PRN
  Filled 2021-03-11: qty 30, 30d supply, fill #0
  Filled 2021-04-22: qty 30, 30d supply, fill #1

## 2021-03-21 ENCOUNTER — Ambulatory Visit (INDEPENDENT_AMBULATORY_CARE_PROVIDER_SITE_OTHER): Payer: No Typology Code available for payment source | Admitting: Family Medicine

## 2021-03-21 ENCOUNTER — Other Ambulatory Visit: Payer: Self-pay

## 2021-03-21 VITALS — BP 126/70 | HR 90 | Temp 96.3°F | Resp 16 | Ht 72.0 in | Wt 234.2 lb

## 2021-03-21 DIAGNOSIS — E785 Hyperlipidemia, unspecified: Secondary | ICD-10-CM

## 2021-03-21 DIAGNOSIS — I10 Essential (primary) hypertension: Secondary | ICD-10-CM

## 2021-03-21 DIAGNOSIS — E1169 Type 2 diabetes mellitus with other specified complication: Secondary | ICD-10-CM | POA: Diagnosis not present

## 2021-03-21 DIAGNOSIS — E669 Obesity, unspecified: Secondary | ICD-10-CM

## 2021-03-21 DIAGNOSIS — F330 Major depressive disorder, recurrent, mild: Secondary | ICD-10-CM | POA: Diagnosis not present

## 2021-03-21 DIAGNOSIS — E039 Hypothyroidism, unspecified: Secondary | ICD-10-CM | POA: Diagnosis not present

## 2021-03-21 DIAGNOSIS — N401 Enlarged prostate with lower urinary tract symptoms: Secondary | ICD-10-CM | POA: Diagnosis not present

## 2021-03-21 DIAGNOSIS — R351 Nocturia: Secondary | ICD-10-CM

## 2021-03-21 LAB — COMPREHENSIVE METABOLIC PANEL
ALT: 26 U/L (ref 0–53)
AST: 22 U/L (ref 0–37)
Albumin: 4.3 g/dL (ref 3.5–5.2)
Alkaline Phosphatase: 65 U/L (ref 39–117)
BUN: 19 mg/dL (ref 6–23)
CO2: 26 mEq/L (ref 19–32)
Calcium: 9.9 mg/dL (ref 8.4–10.5)
Chloride: 99 mEq/L (ref 96–112)
Creatinine, Ser: 0.94 mg/dL (ref 0.40–1.50)
GFR: 78.71 mL/min (ref >60.00)
Glucose, Bld: 110 mg/dL — ABNORMAL HIGH (ref 70–99)
Potassium: 4.1 mEq/L (ref 3.5–5.1)
Sodium: 137 mEq/L (ref 135–145)
Total Bilirubin: 0.9 mg/dL (ref 0.2–1.2)
Total Protein: 6.7 g/dL (ref 6.0–8.3)

## 2021-03-21 LAB — LIPID PANEL
Cholesterol: 128 mg/dL (ref 0–200)
HDL: 57.4 mg/dL (ref >39.00)
LDL Cholesterol: 53 mg/dL (ref 0–99)
NonHDL: 70.44
Total CHOL/HDL Ratio: 2
Triglycerides: 88 mg/dL (ref 0.0–149.0)
VLDL: 17.6 mg/dL (ref 0.0–40.0)

## 2021-03-21 LAB — HEMOGLOBIN A1C: Hgb A1c MFr Bld: 6.3 % (ref 4.6–6.5)

## 2021-03-21 LAB — TSH: TSH: 2.17 u[IU]/mL (ref 0.35–4.50)

## 2021-03-21 MED ORDER — TAMSULOSIN HCL 0.4 MG PO CAPS
0.8000 mg | ORAL_CAPSULE | Freq: Every day | ORAL | 11 refills | Status: DC
  Filled 2021-03-21 – 2021-04-22 (×2): qty 60, 30d supply, fill #0
  Filled 2021-07-09: qty 60, 30d supply, fill #1
  Filled 2021-10-02: qty 60, 30d supply, fill #2
  Filled 2021-12-11: qty 60, 30d supply, fill #3
  Filled 2022-03-14: qty 60, 30d supply, fill #4

## 2021-03-21 NOTE — Patient Instructions (Signed)
Nice to see you. We will get lab work today. We will try increasing your Flomax to 0.8 mg once daily.  I sent a refill to your pharmacy.  If you get lightheaded with this please let us know. If your depression worsens or if you would like to increase Lexapro dose please let us know.  If you develop thoughts of harming yourself please go to the emergency room.

## 2021-03-21 NOTE — Progress Notes (Signed)
Timothy Rumps, MD Phone: [phone]  Timothy Edwards is a 75 y.o. male who presents today for f/u  HYPERTENSION  Disease Monitoring  Home BP Monitoring not checking Chest pain- no    Dyspnea- no Medications  Compliance-  Taking lisinopril/HCTZ.   Edema- no  DIABETES Disease Monitoring: Blood Sugar ranges-low 100s Polyuria/phagia/dipsia- no      Optho- UTD Medications: Compliance- taking metformin Hypoglycemic symptoms- occasionally if he eats dinner after taking his medications  HYPERLIPIDEMIA Symptoms Chest pain on exertion:  no    Medications: Compliance- taking lipitor Right upper quadrant pain- no  Muscle aches- no  BPH: Strain- no Flow- slow at times  Frequency- normal Urgency- yes Nocturia- 2-3x Emptying bladder- yes Medication- flomax one tablet daily  Depression: Patient does note some depression.  Notes it is mostly related to his age and not being able to do what he used to do in the past.  He notes no suicidal thoughts though does note he is coming to grips with being at the end of his life and is okay with this.  He has his family to live for.  He has been on Lexapro.   Social History   Tobacco Use  Smoking Status Never Smoker  Smokeless Tobacco Never Used    Current Outpatient Medications on File Prior to Visit  Medication Sig Dispense Refill  . atorvastatin (LIPITOR) 40 MG tablet TAKE 1 TABLET BY MOUTH DAILY 90 tablet 1  . Blood Glucose Monitoring Suppl (ONE TOUCH BASIC SYSTEM) W/DEVICE KIT To use to check blood sugars once daily 1 each 0  . buPROPion (WELLBUTRIN XL) 300 MG 24 hr tablet TAKE 1 TABLET BY MOUTH DAILY. 90 tablet 1  . cholecalciferol (VITAMIN D) 1000 UNITS tablet Take 1,000 Units by mouth daily.    . clobetasol ointment (TEMOVATE) 0.05 % Apply topically 2 (two) times daily. 30 g 0  . escitalopram (LEXAPRO) 10 MG tablet TAKE 1 TABLET BY MOUTH DAILY 90 tablet 1  . fluticasone (FLONASE) 50 MCG/ACT nasal spray Place 2 sprays into  both nostrils daily. 16 g 6  . glucose blood test strip To use to check blood sugar once daily 100 each 12  . levothyroxine (SYNTHROID) 50 MCG tablet TAKE 1 TABLET BY MOUTH DAILY 90 tablet 0  . lisinopril-hydrochlorothiazide (ZESTORETIC) 10-12.5 MG tablet TAKE 1 TABLET BY MOUTH DAILY. 90 tablet 0  . meloxicam (MOBIC) 15 MG tablet TAKE 1 TABLET BY MOUTH DAILY AS NEEDED FOR PAIN 30 tablet 1  . metFORMIN (GLUCOPHAGE) 500 MG tablet TAKE 2 TABLETS BY MOUTH IN AM AND 1 IN PM FOR 7 DAYS, THEN INCREASE TO 2 TABLETS IN AM AND 2 TABLETS IN PM 360 tablet 1  . omeprazole (PRILOSEC) 40 MG capsule TAKE 1 CAPSULE BY MOUTH 2 TIMES DAILY BEFORE A MEAL. 180 capsule 0  . ONE TOUCH LANCETS MISC To use to check blood sugar once daily 100 each 3  . Semaglutide,0.25 or 0.5MG/DOS, 2 MG/1.5ML SOPN INJECT 0.25 MG INTO THE SKIN ONCE A WEEK FOR 30 DAYS, THEN INJECT 0.5 MG INTO THE SKIN ONCE A WEEK. 1.5 mL 1   No current facility-administered medications on file prior to visit.     ROS see history of present illness  Objective  Physical Exam Vitals:   03/21/21 1106  BP: 126/70  Pulse: 90  Resp: 16  Temp: (!) 96.3 F (35.7 C)  SpO2: 97%    BP Readings from Last 3 Encounters:  03/21/21 126/70  08/25/19 138/72  11/11/18 (!) 150/80   Wt Readings from Last 3 Encounters:  03/21/21 234 lb 3.2 oz (106.2 kg)  04/20/20 200 lb (90.7 kg)  12/22/19 214 lb (97.1 kg)    Physical Exam Constitutional:      General: He is not in acute distress.    Appearance: He is not diaphoretic.  Cardiovascular:     Rate and Rhythm: Normal rate and regular rhythm.     Heart sounds: Normal heart sounds.  Pulmonary:     Effort: Pulmonary effort is normal.     Breath sounds: Normal breath sounds.  Musculoskeletal:     Right lower leg: No edema.     Left lower leg: No edema.  Skin:    General: Skin is warm and dry.  Neurological:     Mental Status: He is alert.    Diabetic Foot Exam - Simple   Simple Foot Form Diabetic  Foot exam was performed with the following findings: Yes 03/21/2021 11:25 AM  Visual Inspection No deformities, no ulcerations, no other skin breakdown bilaterally: Yes Sensation Testing Intact to touch and monofilament testing bilaterally: Yes Pulse Check Posterior Tibialis and Dorsalis pulse intact bilaterally: Yes Comments      Assessment/Plan: Please see individual problem list.  Problem List Items Addressed This Visit    Hypertension - Primary    Adequate control.  He will continue lisinopril-HCTZ 1 tablet daily.  Check labs.      Relevant Orders   Comp Met (CMET)   Hyperlipidemia    Check lipid panel.  Continue Lipitor 40 mg once daily.      Relevant Orders   Lipid panel   Hypothyroidism    Check TSH.      Relevant Orders   TSH   Diabetes mellitus type 2 in obese ([place])    Seems to be well controlled.  He will continue Metformin 1000 mg twice daily.  Check A1c.      Relevant Orders   HgB A1c   Depression    Chronic.  Continues to have some issues with this.  I offered support.  Discussed we could increase the Lexapro though he declines this at this time.  He will monitor and if worsening he will let us know.      BPH (benign prostatic hyperplasia)    Continues to have issues with this.  We will increase his Flomax to 0.8 mg once daily.  He will monitor for lightheadedness with this.  Follow-up in 3 months.      Relevant Medications   tamsulosin (FLOMAX) 0.4 MG CAPS capsule     At the end of the visit as I was handing in his AVS to leave the office the patient brought memory difficulty.  Notes he has gotten his socks next up.  Notes he has forgotten what he went upstairs to get on occasion.  He also notes he got lost one time when he was [place].  I discussed that this could be related to his depression though could also be related to thyroid dysfunction.  We are checking his TSH today.  Discussed that we could do a memory test that that would require more time  than was available today.  We will bring him back in about 4 weeks for recheck.  Advised that he contact us sooner if this worsens at all.  This visit occurred during the SARS-CoV-2 public health emergency.  Safety protocols were in place, including screening questions prior to the visit, additional usage of staff  PPE, and extensive cleaning of exam room while observing appropriate contact time as indicated for disinfecting solutions.    Timothy Rumps, MD [place]

## 2021-03-21 NOTE — Assessment & Plan Note (Signed)
Adequate control.  He will continue lisinopril-HCTZ 1 tablet daily.  Check labs.

## 2021-03-21 NOTE — Assessment & Plan Note (Signed)
Chronic.  Continues to have some issues with this.  I offered support.  Discussed we could increase the Lexapro though he declines this at this time.  He will monitor and if worsening he will let us know.

## 2021-03-21 NOTE — Assessment & Plan Note (Signed)
Continues to have issues with this.  We will increase his Flomax to 0.8 mg once daily.  He will monitor for lightheadedness with this.  Follow-up in 3 months.

## 2021-03-21 NOTE — Assessment & Plan Note (Signed)
Seems to be well controlled.  He will continue Metformin 1000 mg twice daily.  Check A1c.

## 2021-03-21 NOTE — Assessment & Plan Note (Signed)
Check lipid panel.  Continue Lipitor 40 mg once daily. °

## 2021-03-21 NOTE — Assessment & Plan Note (Signed)
Check TSH 

## 2021-03-24 ENCOUNTER — Other Ambulatory Visit: Payer: Self-pay

## 2021-03-27 ENCOUNTER — Other Ambulatory Visit: Payer: Self-pay

## 2021-03-27 MED FILL — Bupropion HCl Tab ER 24HR 300 MG: ORAL | 90 days supply | Qty: 90 | Fill #0 | Status: AC

## 2021-03-28 ENCOUNTER — Other Ambulatory Visit: Payer: Self-pay

## 2021-03-29 ENCOUNTER — Other Ambulatory Visit: Payer: Self-pay

## 2021-04-04 ENCOUNTER — Other Ambulatory Visit: Payer: Self-pay | Admitting: Family Medicine

## 2021-04-04 ENCOUNTER — Other Ambulatory Visit: Payer: Self-pay

## 2021-04-04 MED ORDER — OZEMPIC (0.25 OR 0.5 MG/DOSE) 2 MG/1.5ML ~~LOC~~ SOPN
PEN_INJECTOR | SUBCUTANEOUS | 1 refills | Status: DC
  Filled 2021-04-04: qty 1.5, 28d supply, fill #0
  Filled 2021-04-27: qty 1.5, 28d supply, fill #1

## 2021-04-25 ENCOUNTER — Other Ambulatory Visit: Payer: Self-pay

## 2021-04-27 ENCOUNTER — Other Ambulatory Visit: Payer: Self-pay

## 2021-05-08 ENCOUNTER — Other Ambulatory Visit: Payer: Self-pay | Admitting: Family Medicine

## 2021-05-09 ENCOUNTER — Other Ambulatory Visit: Payer: Self-pay

## 2021-05-09 MED ORDER — ESCITALOPRAM OXALATE 10 MG PO TABS
ORAL_TABLET | Freq: Every day | ORAL | 1 refills | Status: DC
Start: 2021-05-09 — End: 2021-09-27
  Filled 2021-05-09: qty 90, 90d supply, fill #0
  Filled 2021-09-11: qty 90, 90d supply, fill #1

## 2021-05-22 ENCOUNTER — Other Ambulatory Visit: Payer: Self-pay | Admitting: Family Medicine

## 2021-05-23 ENCOUNTER — Other Ambulatory Visit: Payer: Self-pay | Admitting: Family Medicine

## 2021-05-23 ENCOUNTER — Other Ambulatory Visit: Payer: Self-pay

## 2021-05-23 DIAGNOSIS — E1169 Type 2 diabetes mellitus with other specified complication: Secondary | ICD-10-CM

## 2021-05-23 MED ORDER — OMEPRAZOLE 40 MG PO CPDR
DELAYED_RELEASE_CAPSULE | ORAL | 0 refills | Status: DC
  Filled 2021-05-23: qty 180, 90d supply, fill #0

## 2021-05-23 MED ORDER — OZEMPIC (0.25 OR 0.5 MG/DOSE) 2 MG/1.5ML ~~LOC~~ SOPN
PEN_INJECTOR | SUBCUTANEOUS | 1 refills | Status: DC
  Filled 2021-05-23: qty 1.5, 28d supply, fill #0
  Filled 2021-06-27: qty 1.5, 28d supply, fill #1

## 2021-05-23 MED ORDER — METFORMIN HCL 500 MG PO TABS
ORAL_TABLET | ORAL | 1 refills | Status: DC
  Filled 2021-05-23: qty 360, 90d supply, fill #0
  Filled 2021-12-11: qty 360, 90d supply, fill #1

## 2021-05-29 ENCOUNTER — Other Ambulatory Visit: Payer: Self-pay | Admitting: Family Medicine

## 2021-05-30 ENCOUNTER — Other Ambulatory Visit: Payer: Self-pay

## 2021-05-30 MED ORDER — MELOXICAM 15 MG PO TABS
ORAL_TABLET | Freq: Every day | ORAL | 1 refills | Status: DC | PRN
  Filled 2021-05-30: qty 30, 30d supply, fill #0
  Filled 2021-07-09: qty 30, 30d supply, fill #1

## 2021-06-10 ENCOUNTER — Other Ambulatory Visit (HOSPITAL_COMMUNITY): Payer: Self-pay

## 2021-06-13 ENCOUNTER — Other Ambulatory Visit: Payer: Self-pay | Admitting: Family Medicine

## 2021-06-14 ENCOUNTER — Other Ambulatory Visit: Payer: Self-pay

## 2021-06-14 MED ORDER — ATORVASTATIN CALCIUM 40 MG PO TABS
ORAL_TABLET | Freq: Every day | ORAL | 1 refills | Status: DC
  Filled 2021-06-14: qty 90, 90d supply, fill #0
  Filled 2021-10-18: qty 90, 90d supply, fill #1

## 2021-06-14 MED ORDER — LISINOPRIL-HYDROCHLOROTHIAZIDE 10-12.5 MG PO TABS
1.0000 | ORAL_TABLET | Freq: Every day | ORAL | 0 refills | Status: DC
  Filled 2021-06-14: qty 90, 90d supply, fill #0

## 2021-06-14 MED ORDER — LEVOTHYROXINE SODIUM 50 MCG PO TABS
ORAL_TABLET | Freq: Every day | ORAL | 0 refills | Status: DC
  Filled 2021-06-14: qty 90, 90d supply, fill #0

## 2021-06-20 ENCOUNTER — Ambulatory Visit (INDEPENDENT_AMBULATORY_CARE_PROVIDER_SITE_OTHER): Payer: No Typology Code available for payment source | Admitting: Family Medicine

## 2021-06-20 ENCOUNTER — Other Ambulatory Visit: Payer: Self-pay

## 2021-06-20 VITALS — BP 130/80 | HR 78 | Temp 98.3°F | Ht 71.0 in | Wt 238.8 lb

## 2021-06-20 DIAGNOSIS — R413 Other amnesia: Secondary | ICD-10-CM | POA: Diagnosis not present

## 2021-06-20 DIAGNOSIS — E1169 Type 2 diabetes mellitus with other specified complication: Secondary | ICD-10-CM | POA: Diagnosis not present

## 2021-06-20 DIAGNOSIS — E669 Obesity, unspecified: Secondary | ICD-10-CM

## 2021-06-20 DIAGNOSIS — F330 Major depressive disorder, recurrent, mild: Secondary | ICD-10-CM

## 2021-06-20 NOTE — Progress Notes (Signed)
[name], MD Phone: [phone]  Timothy Edwards is a 75 y.o. male who presents today for f/u.  DIABETES Disease Monitoring: Blood Sugar ranges-not checking polyuria/phagia/dipsia-no      Optho-up-to-date Medications: Compliance-taking Ozempic, sometimes forgets to take his evening dose of metformin though is taking the morning dose consistently hypoglycemic symptoms-no  Depression: Patient notes he is not depressed though he is angry with the world in general.  Notes he feels as though he is not able to do what he used to do and that upsets him.  He feels like he has lasted longer than he should have relative to his family members that have already passed away.  He notes significant treatment for mental health issues in the past with shock treatments as well as antipsychotics.  He notes it would not bother him to leave this world though it would hurt his family so he would not think of doing this.  He notes no intent or plan to kill himself.  He does report having a gun at home though no intention to use this.  He reports having seen psychiatrists in the past and notes he is done with that.  He is on Lexapro and Wellbutrin.  He does not want to increase his medications at this time.  He does note trouble remembering things.   Social History   Tobacco Use  Smoking Status Never  Smokeless Tobacco Never    Current Outpatient Medications on File Prior to Visit  Medication Sig Dispense Refill   atorvastatin (LIPITOR) 40 MG tablet Take by mouth daily. 90 tablet 1   Blood Glucose Monitoring Suppl (ONE TOUCH BASIC SYSTEM) W/DEVICE KIT To use to check blood sugars once daily 1 each 0   buPROPion (WELLBUTRIN XL) 300 MG 24 hr tablet TAKE 1 TABLET BY MOUTH DAILY. 90 tablet 1   cholecalciferol (VITAMIN D) 1000 UNITS tablet Take 1,000 Units by mouth daily.     clobetasol ointment (TEMOVATE) 0.05 % Apply topically 2 (two) times daily. 30 g 0   escitalopram (LEXAPRO) 10 MG tablet TAKE 1  TABLET BY MOUTH DAILY 90 tablet 1   fluticasone (FLONASE) 50 MCG/ACT nasal spray Place 2 sprays into both nostrils daily. 16 g 6   glucose blood test strip To use to check blood sugar once daily 100 each 12   levothyroxine (SYNTHROID) 50 MCG tablet Take by mouth daily. 90 tablet 0   lisinopril-hydrochlorothiazide (ZESTORETIC) 10-12.5 MG tablet Take 1 tablet by mouth daily. 90 tablet 0   meloxicam (MOBIC) 15 MG tablet Take one tablet by mouth daily as needed. 30 tablet 1   metFORMIN (GLUCOPHAGE) 500 MG tablet TAKE 2 TABLETS BY MOUTH IN AM AND 1 IN PM FOR 7 DAYS, THEN INCREASE TO 2 TABLETS IN AM AND 2 TABLETS IN PM 360 tablet 1   omeprazole (PRILOSEC) 40 MG capsule TAKE 1 CAPSULE BY MOUTH 2 TIMES DAILY BEFORE A MEAL. 180 capsule 0   ONE TOUCH LANCETS MISC To use to check blood sugar once daily 100 each 3   Semaglutide,0.25 or 0.5MG/DOS, (OZEMPIC, 0.25 OR 0.5 MG/DOSE,) 2 MG/1.5ML SOPN INJECT 0.25 MG INTO THE SKIN ONCE A WEEK FOR 30 DAYS, THEN INJECT 0.5 MG INTO THE SKIN ONCE A WEEK. 1.5 mL 1   tamsulosin (FLOMAX) 0.4 MG CAPS capsule Take 2 capsules (0.8 mg total) by mouth daily. 60 capsule 11   No current facility-administered medications on file prior to visit.     ROS see history of present illness  Objective  Physical Exam Vitals:   06/20/21 1352  BP: 130/80  Pulse: 78  Temp: 98.3 F (36.8 C)  SpO2: 95%    BP Readings from Last 3 Encounters:  06/20/21 130/80  03/21/21 126/70  08/25/19 138/72   Wt Readings from Last 3 Encounters:  06/20/21 238 lb 12.8 oz (108.3 kg)  03/21/21 234 lb 3.2 oz (106.2 kg)  04/20/20 200 lb (90.7 kg)    Physical Exam Constitutional:      General: He is not in acute distress.    Appearance: He is not diaphoretic.  Cardiovascular:     Rate and Rhythm: Normal rate and regular rhythm.     Heart sounds: Normal heart sounds.  Pulmonary:     Effort: Pulmonary effort is normal.     Breath sounds: Normal breath sounds.  Skin:    General: Skin is  warm and dry.  Neurological:     Mental Status: He is alert.     Assessment/Plan: Please see individual problem list.  Problem List Items Addressed This Visit     Depression    Chronic issue.  He continues to have issues with this.  I did offer support.  I discussed that his depression could be playing into his memory issues and how he senses the world.  Discussed increasing the Lexapro though he declines this at this time.  He notes some passive suicidal thoughts though no active suicidal thoughts.  I advised if he ever develops active thoughts of harming himself or has plan or intent to harm himself he has to go to the emergency room for intervention.  He will continue on Lexapro 10 mg once daily and Wellbutrin 300 mg daily.  I also discussed the potential for seeing psychiatry though he declines that as well.       Diabetes mellitus type 2 in obese ([place]) - Primary    We will check an A1c.  He will continue Ozempic and metformin.       Relevant Orders   HgB A1c   Memory loss    Suspect his depression is playing a role.  We will check a B12.  Recent TSH acceptable.       Relevant Orders   B12     Health Maintenance: he declines the COVID vaccine.   Return in about 3 months (around 09/20/2021) for depression.  This visit occurred during the SARS-CoV-2 public health emergency.  Safety protocols were in place, including screening questions prior to the visit, additional usage of staff PPE, and extensive cleaning of exam room while observing appropriate contact time as indicated for disinfecting solutions.    [name], MD [place]

## 2021-06-20 NOTE — Assessment & Plan Note (Signed)
Suspect his depression is playing a role.  We will check a B12.  Recent TSH acceptable.

## 2021-06-20 NOTE — Assessment & Plan Note (Signed)
We will check an A1c.  He will continue Ozempic and metformin.

## 2021-06-20 NOTE — Assessment & Plan Note (Signed)
Chronic issue.  He continues to have issues with this.  I did offer support.  I discussed that his depression could be playing into his memory issues and how he senses the world.  Discussed increasing the Lexapro though he declines this at this time.  He notes some passive suicidal thoughts though no active suicidal thoughts.  I advised if he ever develops active thoughts of harming himself or has plan or intent to harm himself he has to go to the emergency room for intervention.  He will continue on Lexapro 10 mg once daily and Wellbutrin 300 mg daily.  I also discussed the potential for seeing psychiatry though he declines that as well.

## 2021-06-20 NOTE — Patient Instructions (Signed)
Nice to see you. We will check labs today.  If you would like to increase your lexapro in the future please let me know.  If you ever develop thoughts of harming yourself or a plan or intent to harm yourself please seek medical attention immediately.

## 2021-06-21 LAB — VITAMIN B12: Vitamin B-12: 286 pg/mL (ref 211–911)

## 2021-06-21 LAB — HEMOGLOBIN A1C: Hgb A1c MFr Bld: 6.3 % (ref 4.6–6.5)

## 2021-06-27 ENCOUNTER — Other Ambulatory Visit: Payer: Self-pay

## 2021-07-11 ENCOUNTER — Other Ambulatory Visit: Payer: Self-pay

## 2021-08-09 ENCOUNTER — Other Ambulatory Visit: Payer: Self-pay | Admitting: Internal Medicine

## 2021-08-10 ENCOUNTER — Other Ambulatory Visit: Payer: Self-pay

## 2021-08-10 MED ORDER — BUPROPION HCL ER (XL) 300 MG PO TB24
ORAL_TABLET | Freq: Every day | ORAL | 1 refills | Status: DC
  Filled 2021-08-10: qty 90, 90d supply, fill #0
  Filled 2021-12-04: qty 90, 90d supply, fill #1

## 2021-08-21 ENCOUNTER — Other Ambulatory Visit: Payer: Self-pay | Admitting: Family Medicine

## 2021-08-22 ENCOUNTER — Other Ambulatory Visit: Payer: Self-pay

## 2021-08-22 MED ORDER — MELOXICAM 15 MG PO TABS
ORAL_TABLET | Freq: Every day | ORAL | 1 refills | Status: DC | PRN
  Filled 2021-08-22: qty 30, 30d supply, fill #0
  Filled 2021-10-02: qty 30, 30d supply, fill #1

## 2021-08-30 ENCOUNTER — Other Ambulatory Visit: Payer: Self-pay | Admitting: Family Medicine

## 2021-09-01 ENCOUNTER — Other Ambulatory Visit: Payer: Self-pay | Admitting: Family Medicine

## 2021-09-01 ENCOUNTER — Other Ambulatory Visit: Payer: Self-pay

## 2021-09-02 ENCOUNTER — Other Ambulatory Visit: Payer: Self-pay

## 2021-09-02 MED FILL — Semaglutide Soln Pen-inj 0.25 or 0.5 MG/DOSE (2 MG/1.5ML): SUBCUTANEOUS | 84 days supply | Qty: 4.5 | Fill #0 | Status: AC

## 2021-09-12 ENCOUNTER — Other Ambulatory Visit: Payer: Self-pay

## 2021-09-21 ENCOUNTER — Other Ambulatory Visit: Payer: Self-pay

## 2021-09-21 ENCOUNTER — Ambulatory Visit (INDEPENDENT_AMBULATORY_CARE_PROVIDER_SITE_OTHER): Payer: No Typology Code available for payment source | Admitting: Family Medicine

## 2021-09-21 VITALS — Ht 71.0 in | Wt 238.0 lb

## 2021-09-21 DIAGNOSIS — R509 Fever, unspecified: Secondary | ICD-10-CM | POA: Diagnosis not present

## 2021-09-21 DIAGNOSIS — I10 Essential (primary) hypertension: Secondary | ICD-10-CM | POA: Diagnosis not present

## 2021-09-21 DIAGNOSIS — E1169 Type 2 diabetes mellitus with other specified complication: Secondary | ICD-10-CM | POA: Diagnosis not present

## 2021-09-21 DIAGNOSIS — R413 Other amnesia: Secondary | ICD-10-CM | POA: Diagnosis not present

## 2021-09-21 DIAGNOSIS — F331 Major depressive disorder, recurrent, moderate: Secondary | ICD-10-CM

## 2021-09-21 DIAGNOSIS — E669 Obesity, unspecified: Secondary | ICD-10-CM

## 2021-09-21 NOTE — Progress Notes (Signed)
Virtual Visit via telephone Note  This visit type was conducted due to national recommendations for restrictions regarding the COVID-19 pandemic (e.g. social distancing).  This format is felt to be most appropriate for this patient at this time.  All issues noted in this document were discussed and addressed.  No physical exam was performed (except for noted visual exam findings with Video Visits).   I connected with Timothy Edwards today at  2:45 PM EDT by a video enabled telemedicine application or telephone and verified that I am speaking with the correct person using two identifiers. Location patient: home Location provider: work  Persons participating in the virtual visit: patient, provider  I discussed the limitations, risks, security and privacy concerns of performing an evaluation and management service by telephone and the availability of in person appointments. I also discussed with the patient that there may be a patient responsible charge related to this service. The patient expressed understanding and agreed to proceed.  Interactive audio and video telecommunications were attempted between this provider and patient, however failed, due to patient having technical difficulties OR patient did not have access to video capability.  We continued and completed visit with audio only.   Reason for visit: f/u  HPI: DIABETES Disease Monitoring: Blood Sugar ranges-98-130 Polyuria/phagia/dipsia- no      Optho- UTD Medications: Compliance- taking metformin and ozempic Hypoglycemic symptoms- one time over the summer, occurred related to him not eating breakfast and lunch  BPH: Patient at times feels like he cannot empty his bladder.  He does have to strain a little bit at the end.  He does note normal flow.  He does note some nocturia.  He is on Flomax.  Fever: Patient reports fever of 101.1 degrees at its highest 1 to 2 days ago.  He feels hot and cold at times.  He is had some nausea and  some diarrhea yesterday.  He has decreased appetite.  Has been drinking some fluids.  He notes no cough, congestion, or vomiting.  He has not tested himself for COVID.  Depression: Patient reports he is unhappy with how his life is going.  He is not able to do what he used to do.  He has trouble remembering things.  He cannot remember anyone's names.  He has chronic depression issues going back to his 76s.  Reports he received electroconvulsive treatments in the distant past.  He is on his Wellbutrin and Lexapro and notes those things are quite helpful and he would not want to go without them.  He notes it would not matter to him if he died though it would affect his family adversely and he recognizes that.  He does report ongoing intermittent thoughts of shooting himself in the head as well as occasionally thinking about driving his car off the road.  These have been ongoing for a long time.  We had a discussion about what these thoughts meant.  He noted that he does not have a current plan or intent to kill himself.  Given these thoughts and his ongoing depression I discussed having him see a psychiatrist though he noted he did not want to do that as he has been through all of that in the past.  I also discussed having him go to the emergency department to see a psychiatrist given his intermittent thoughts of shooting himself or driving off the road though he notes he was not going to do that either.  I discussed having somebody come out  to check on his safety and he stated "that would be a mistake.  You are a good doctor, but that would be a mistake.  I am fine and do not need that.  I have a good wife and family and I am fine at this time."  I did asked what he meant by that would be a mistake though he would not clarify other than that he was fine at this time.  I discussed the seriousness of his thoughts and noted that if I felt he was an immediate harm to himself that we would need to get him help.  He  again noted that he is currently fine and has no current plan or intent to harm himself.  At the end of the visit he apologized for how our discussion went and noted that he just was not feeling well and was just talking and thanked me for taking such good care of him.  ROS: See pertinent positives and negatives per HPI.  Past Medical History:  Diagnosis Date   Depression    Diabetes mellitus without complication ([place])    Hyperlipidemia    Hypertension    Unspecified hypothyroidism    Vitamin D deficiency     Past Surgical History:  Procedure Laterality Date   ESOPHAGOGASTRODUODENOSCOPY (EGD) WITH PROPOFOL N/A 07/15/2018   Procedure: ESOPHAGOGASTRODUODENOSCOPY (EGD) WITH PROPOFOL;  Surgeon: Lin Landsman, MD;  Location: [facility] ENDOSCOPY;  Service: Gastroenterology;  Laterality: N/A;   FOOT SURGERY      Family History  Problem Relation Age of Onset   Obesity Father    Aneurysm Father    Hypertension Father    Gallbladder disease Mother     SOCIAL HX: Non-smoker   Current Outpatient Medications:    atorvastatin (LIPITOR) 40 MG tablet, Take by mouth daily., Disp: 90 tablet, Rfl: 1   Blood Glucose Monitoring Suppl (ONE TOUCH BASIC SYSTEM) W/DEVICE KIT, To use to check blood sugars once daily, Disp: 1 each, Rfl: 0   buPROPion (WELLBUTRIN XL) 300 MG 24 hr tablet, TAKE 1 TABLET BY MOUTH DAILY., Disp: 90 tablet, Rfl: 1   cholecalciferol (VITAMIN D) 1000 UNITS tablet, Take 1,000 Units by mouth daily., Disp: , Rfl:    clobetasol ointment (TEMOVATE) 0.05 %, Apply topically 2 (two) times daily., Disp: 30 g, Rfl: 0   escitalopram (LEXAPRO) 10 MG tablet, TAKE 1 TABLET BY MOUTH DAILY, Disp: 90 tablet, Rfl: 1   fluticasone (FLONASE) 50 MCG/ACT nasal spray, Place 2 sprays into both nostrils daily., Disp: 16 g, Rfl: 6   glucose blood test strip, To use to check blood sugar once daily, Disp: 100 each, Rfl: 12   levothyroxine (SYNTHROID) 50 MCG tablet, Take by mouth daily., Disp: 90 tablet,  Rfl: 0   lisinopril-hydrochlorothiazide (ZESTORETIC) 10-12.5 MG tablet, Take 1 tablet by mouth daily., Disp: 90 tablet, Rfl: 0   meloxicam (MOBIC) 15 MG tablet, Take one tablet by mouth daily as needed., Disp: 30 tablet, Rfl: 1   metFORMIN (GLUCOPHAGE) 500 MG tablet, TAKE 2 TABLETS BY MOUTH IN AM AND 1 IN PM FOR 7 DAYS, THEN INCREASE TO 2 TABLETS IN AM AND 2 TABLETS IN PM, Disp: 360 tablet, Rfl: 1   omeprazole (PRILOSEC) 40 MG capsule, TAKE 1 CAPSULE BY MOUTH 2 TIMES DAILY BEFORE A MEAL., Disp: 180 capsule, Rfl: 0   ONE TOUCH LANCETS MISC, To use to check blood sugar once daily, Disp: 100 each, Rfl: 3   Semaglutide,0.25 or 0.5MG/DOS, (OZEMPIC, 0.25 OR 0.5 MG/DOSE,)  2 MG/1.5ML SOPN, Inject 0.5 mg into the skin once a week., Disp: 4.5 mL, Rfl: 1   tamsulosin (FLOMAX) 0.4 MG CAPS capsule, Take 2 capsules (0.8 mg total) by mouth daily., Disp: 60 capsule, Rfl: 11  EXAM: This was a telephone visit and thus no physical exam was completed.  ASSESSMENT AND PLAN:  Discussed the following assessment and plan:  Problem List Items Addressed This Visit     Depression    Chronic issue.  He continues to have issues with this.  I offered support and discussed having him evaluated given his recurrent suicidal thoughts.  He declines any additional evaluation for this.  He notes no current plan or intent to harm himself.  Given that he has no current plan or intent he is not an immediate harm to himself.  We contracted for safety and he noted he would call if he had any intent or plan to harm himself.  Discussed he would need to be evaluated in the emergency room if that occurred.  As noted above he did decline seeing psychiatry for this.  For the moment he will remain on Lexapro 10 mg once daily and Wellbutrin 300 mg daily.  I will plan to follow-up with him by phone in a couple of days to check-in.      Diabetes mellitus type 2 in obese [facility])    He will come in for lab work.  He will continue his metformin 1000  mg twice daily and Ozempic 0.5 mg once weekly.      Relevant Orders   HgB A1c   Febrile illness    Likely a GI illness though I did discuss having him tested for COVID with a home COVID test.  He noted he would have his daughter or his wife help him with that when they got home.  I advised that he contact us if he is positive.  Advised to stay adequately hydrated.  He will monitor his symptoms.      Hypertension    Undetermined control.  He will continue his lisinopril-HCTZ 1 tablet daily.  We will check labs.  We will check his blood pressure at his next visit.      Relevant Orders   Basic Metabolic Panel (BMET)   Memory loss - Primary   Relevant Orders   B12    Return in about 3 weeks (around 10/12/2021) for Labs.   I discussed the assessment and treatment plan with the patient. The patient was provided an opportunity to ask questions and all were answered. The patient agreed with the plan and demonstrated an understanding of the instructions.   The patient was advised to call back or seek an in-person evaluation if the symptoms worsen or if the condition fails to improve as anticipated.  I provided 33 minutes of non-face-to-face time during this encounter.   [name], MD

## 2021-09-22 DIAGNOSIS — R509 Fever, unspecified: Secondary | ICD-10-CM | POA: Insufficient documentation

## 2021-09-22 NOTE — Assessment & Plan Note (Signed)
Undetermined control.  He will continue his lisinopril-HCTZ 1 tablet daily.  We will check labs.  We will check his blood pressure at his next visit.

## 2021-09-22 NOTE — Assessment & Plan Note (Signed)
Chronic issue.  He continues to have issues with this.  I offered support and discussed having him evaluated given his recurrent suicidal thoughts.  He declines any additional evaluation for this.  He notes no current plan or intent to harm himself.  Given that he has no current plan or intent he is not an immediate harm to himself.  We contracted for safety and he noted he would call if he had any intent or plan to harm himself.  Discussed he would need to be evaluated in the emergency room if that occurred.  As noted above he did decline seeing psychiatry for this.  For the moment he will remain on Lexapro 10 mg once daily and Wellbutrin 300 mg daily.  I will plan to follow-up with him by phone in a couple of days to check-in.

## 2021-09-22 NOTE — Assessment & Plan Note (Signed)
He will come in for lab work.  He will continue his metformin 1000 mg twice daily and Ozempic 0.5 mg once weekly.

## 2021-09-22 NOTE — Assessment & Plan Note (Signed)
Likely a GI illness though I did discuss having him tested for COVID with a home COVID test.  He noted he would have his daughter or his wife help him with that when they got home.  I advised that he contact us if he is positive.  Advised to stay adequately hydrated.  He will monitor his symptoms.

## 2021-09-23 ENCOUNTER — Telehealth: Payer: Self-pay | Admitting: Family Medicine

## 2021-09-23 NOTE — Telephone Encounter (Signed)
Attempted to call the patient to follow-up after our last visit to see how he was feeling from his recent febrile illness and to discuss medication management and to get him scheduled for follow-up labs. There was no answer. I asked that he call us back on Monday.

## 2021-09-26 NOTE — Telephone Encounter (Signed)
Patient returned Dr Purvis Sheffield phone call. Patient states he feels better.

## 2021-09-27 ENCOUNTER — Other Ambulatory Visit: Payer: Self-pay

## 2021-09-27 MED ORDER — ESCITALOPRAM OXALATE 20 MG PO TABS
20.0000 mg | ORAL_TABLET | Freq: Every day | ORAL | 1 refills | Status: DC
  Filled 2021-09-27: qty 90, 90d supply, fill #0
  Filled 2022-03-14: qty 90, 90d supply, fill #1

## 2021-09-27 NOTE — Telephone Encounter (Signed)
I called the patient and he is willing to take the increase of the Lexapro and he is scheduled for labs in 2 weeks.  Frantz Quattrone,cma

## 2021-09-27 NOTE — Telephone Encounter (Signed)
Noted.  Can you follow-up with the patient to see if he would be willing to increase the dose of his Lexapro to see if that would help with his depression?  Can you also get him scheduled for lab work in the next couple of weeks?  I can place the orders after you speak with him.  Thanks.

## 2021-09-27 NOTE — Addendum Note (Signed)
Addended by: Glori Luis on: 09/27/2021 04:19 PM   Modules accepted: Orders

## 2021-09-27 NOTE — Telephone Encounter (Signed)
Noted.  Increased dose of Lexapro sent to the pharmacy.  Labs ordered.  He does need follow-up with me in about 3 months.  Thanks.

## 2021-10-03 ENCOUNTER — Other Ambulatory Visit: Payer: Self-pay

## 2021-10-14 ENCOUNTER — Other Ambulatory Visit: Payer: No Typology Code available for payment source

## 2021-10-18 ENCOUNTER — Other Ambulatory Visit: Payer: Self-pay | Admitting: Family Medicine

## 2021-10-19 ENCOUNTER — Other Ambulatory Visit: Payer: Self-pay

## 2021-10-19 MED ORDER — LEVOTHYROXINE SODIUM 50 MCG PO TABS
50.0000 ug | ORAL_TABLET | Freq: Every day | ORAL | 0 refills | Status: DC
  Filled 2021-10-19: qty 90, 90d supply, fill #0

## 2021-10-23 ENCOUNTER — Other Ambulatory Visit: Payer: Self-pay | Admitting: Family Medicine

## 2021-10-24 ENCOUNTER — Other Ambulatory Visit: Payer: Self-pay

## 2021-10-24 ENCOUNTER — Other Ambulatory Visit: Payer: Self-pay | Admitting: Family Medicine

## 2021-10-24 MED FILL — Lisinopril & Hydrochlorothiazide Tab 10-12.5 MG: ORAL | 90 days supply | Qty: 90 | Fill #0 | Status: AC

## 2021-10-26 ENCOUNTER — Other Ambulatory Visit (INDEPENDENT_AMBULATORY_CARE_PROVIDER_SITE_OTHER): Payer: No Typology Code available for payment source

## 2021-10-26 ENCOUNTER — Other Ambulatory Visit: Payer: Self-pay

## 2021-10-26 DIAGNOSIS — R413 Other amnesia: Secondary | ICD-10-CM

## 2021-10-26 DIAGNOSIS — E119 Type 2 diabetes mellitus without complications: Secondary | ICD-10-CM

## 2021-10-26 DIAGNOSIS — I10 Essential (primary) hypertension: Secondary | ICD-10-CM | POA: Diagnosis not present

## 2021-10-26 DIAGNOSIS — E669 Obesity, unspecified: Secondary | ICD-10-CM

## 2021-10-26 DIAGNOSIS — E1169 Type 2 diabetes mellitus with other specified complication: Secondary | ICD-10-CM | POA: Diagnosis not present

## 2021-10-26 LAB — BASIC METABOLIC PANEL
BUN: 19 mg/dL (ref 6–23)
CO2: 30 mEq/L (ref 19–32)
Calcium: 9.5 mg/dL (ref 8.4–10.5)
Chloride: 101 mEq/L (ref 96–112)
Creatinine, Ser: 1.11 mg/dL (ref 0.40–1.50)
GFR: 64.2 mL/min (ref >60.00)
Glucose, Bld: 150 mg/dL — ABNORMAL HIGH (ref 70–99)
Potassium: 4 mEq/L (ref 3.5–5.1)
Sodium: 138 mEq/L (ref 135–145)

## 2021-10-26 LAB — VITAMIN B12: Vitamin B-12: 311 pg/mL (ref 211–911)

## 2021-10-26 LAB — HEMOGLOBIN A1C: Hgb A1c MFr Bld: 6.7 % — ABNORMAL HIGH (ref 4.6–6.5)

## 2021-11-10 ENCOUNTER — Other Ambulatory Visit: Payer: Self-pay

## 2021-11-10 ENCOUNTER — Other Ambulatory Visit: Payer: Self-pay | Admitting: Family Medicine

## 2021-11-11 ENCOUNTER — Other Ambulatory Visit: Payer: Self-pay

## 2021-11-11 MED ORDER — MELOXICAM 15 MG PO TABS
ORAL_TABLET | Freq: Every day | ORAL | 1 refills | Status: DC | PRN
  Filled 2021-11-11: qty 30, 30d supply, fill #0
  Filled 2021-12-11: qty 30, 30d supply, fill #1

## 2021-12-05 ENCOUNTER — Other Ambulatory Visit: Payer: Self-pay

## 2021-12-11 ENCOUNTER — Other Ambulatory Visit: Payer: Self-pay | Admitting: Family Medicine

## 2021-12-12 ENCOUNTER — Other Ambulatory Visit: Payer: Self-pay

## 2021-12-13 ENCOUNTER — Other Ambulatory Visit: Payer: Self-pay

## 2021-12-13 MED ORDER — OMEPRAZOLE 40 MG PO CPDR
40.0000 mg | DELAYED_RELEASE_CAPSULE | Freq: Every day | ORAL | 2 refills | Status: DC
  Filled 2021-12-13: qty 90, 90d supply, fill #0
  Filled 2022-06-05: qty 90, 90d supply, fill #1
  Filled 2022-10-16: qty 90, 90d supply, fill #2

## 2022-01-11 ENCOUNTER — Ambulatory Visit: Payer: No Typology Code available for payment source | Admitting: Family Medicine

## 2022-01-20 ENCOUNTER — Encounter: Payer: Self-pay | Admitting: Family Medicine

## 2022-01-20 ENCOUNTER — Other Ambulatory Visit: Payer: Self-pay

## 2022-01-20 ENCOUNTER — Ambulatory Visit (INDEPENDENT_AMBULATORY_CARE_PROVIDER_SITE_OTHER): Payer: No Typology Code available for payment source | Admitting: Family Medicine

## 2022-01-20 VITALS — BP 120/80 | HR 81 | Temp 98.1°F | Ht 71.0 in | Wt 241.0 lb

## 2022-01-20 DIAGNOSIS — E669 Obesity, unspecified: Secondary | ICD-10-CM

## 2022-01-20 DIAGNOSIS — E1169 Type 2 diabetes mellitus with other specified complication: Secondary | ICD-10-CM | POA: Diagnosis not present

## 2022-01-20 DIAGNOSIS — R413 Other amnesia: Secondary | ICD-10-CM

## 2022-01-20 DIAGNOSIS — W19XXXA Unspecified fall, initial encounter: Secondary | ICD-10-CM

## 2022-01-20 DIAGNOSIS — F331 Major depressive disorder, recurrent, moderate: Secondary | ICD-10-CM

## 2022-01-20 NOTE — Assessment & Plan Note (Signed)
I encouraged him to start on B12 if he has not already done so.  We will recheck his B12 with his upcoming labs.

## 2022-01-20 NOTE — Assessment & Plan Note (Signed)
Discussed falls precautions.  I encouraged him to get new better fitting shoes.  Discussed removing area rugs.  I offered a referral for physical therapy though he defers this.  He will remain as active as possible to maintain his leg strength.

## 2022-01-20 NOTE — Assessment & Plan Note (Signed)
Chronic issue.  Generally well controlled.  He will continue Lexapro 20 mg once daily and Wellbutrin 300 mg daily.  I discussed that physically he is doing quite well for a 76 year old.  He appreciated this.

## 2022-01-20 NOTE — Assessment & Plan Note (Signed)
He will return for an A1c when this is due.  This seems to be relatively well controlled.  He will continue metformin 1000 mg twice daily and Ozempic 0.5 mg once weekly.

## 2022-01-20 NOTE — Progress Notes (Signed)
[name], MD Phone: [phone]  Timothy Edwards is a 76 y.o. male who presents today for f/u.  DIABETES Disease Monitoring: Blood Sugar ranges-106-112 Polyuria/phagia/dipsia- some polyuria though he notes he purposefully drinks a lot of liquids      Optho- due Medications: Compliance- taking metformin, ozempic Hypoglycemic symptoms- rare when very active  Depression: Patient notes some depression.  Mild anxiety.  He feels that things are generally well controlled.  He continues on Lexapro and Wellbutrin.  No SI.  He reports he has undergoing multiple treatments in the past for depression in the past and his current regimen seems to work best for him.  He notes that he has slowed down with age and that has contributed to his feeling down at times.    Patient was previously advised to try B12 given borderline low B12 levels.  He is unsure if he has been taking this.  Fall: Patient notes he was wearing loose shoes and tripped.  He had a scrape on his right elbow though otherwise no injury.  That has been healing well.  No head injury.  The patient also noted he feels as though he does not move as quickly as he used to.  He notes at times he does have trouble getting up if he is on the ground.   Social History   Tobacco Use  Smoking Status Never  Smokeless Tobacco Never    Current Outpatient Medications on File Prior to Visit  Medication Sig Dispense Refill   atorvastatin (LIPITOR) 40 MG tablet Take by mouth daily. 90 tablet 1   Blood Glucose Monitoring Suppl (ONE TOUCH BASIC SYSTEM) W/DEVICE KIT To use to check blood sugars once daily 1 each 0   buPROPion (WELLBUTRIN XL) 300 MG 24 hr tablet TAKE 1 TABLET BY MOUTH DAILY. 90 tablet 1   cholecalciferol (VITAMIN D) 1000 UNITS tablet Take 1,000 Units by mouth daily.     clobetasol ointment (TEMOVATE) 0.05 % Apply topically 2 (two) times daily. 30 g 0   escitalopram (LEXAPRO) 20 MG tablet Take 1 tablet (20 mg total) by mouth  daily. 90 tablet 1   fluticasone (FLONASE) 50 MCG/ACT nasal spray Place 2 sprays into both nostrils daily. 16 g 6   glucose blood test strip To use to check blood sugar once daily 100 each 12   levothyroxine (SYNTHROID) 50 MCG tablet Take 1 tablet (50 mcg total) by mouth daily. 90 tablet 0   lisinopril-hydrochlorothiazide (ZESTORETIC) 10-12.5 MG tablet Take 1 tablet by mouth daily. 90 tablet 1   meloxicam (MOBIC) 15 MG tablet Take one tablet by mouth daily as needed. 30 tablet 1   metFORMIN (GLUCOPHAGE) 500 MG tablet TAKE 2 TABLETS BY MOUTH IN AM AND 1 IN PM FOR 7 DAYS, THEN INCREASE TO 2 TABLETS IN AM AND 2 TABLETS IN PM 360 tablet 1   omeprazole (PRILOSEC) 40 MG capsule Take 1 capsule (40 mg total) by mouth daily. 180 capsule 2   ONE TOUCH LANCETS MISC To use to check blood sugar once daily 100 each 3   Semaglutide,0.25 or 0.5MG/DOS, (OZEMPIC, 0.25 OR 0.5 MG/DOSE,) 2 MG/1.5ML SOPN Inject 0.5 mg into the skin once a week. 4.5 mL 1   tamsulosin (FLOMAX) 0.4 MG CAPS capsule Take 2 capsules (0.8 mg total) by mouth daily. 60 capsule 11   No current facility-administered medications on file prior to visit.     ROS see history of present illness  Objective  Physical Exam Vitals:  01/20/22 1358  BP: 120/80  Pulse: 81  Temp: 98.1 F (36.7 C)  SpO2: 96%    BP Readings from Last 3 Encounters:  01/20/22 120/80  06/20/21 130/80  03/21/21 126/70   Wt Readings from Last 3 Encounters:  01/20/22 241 lb (109.3 kg)  09/21/21 238 lb (108 kg)  06/20/21 238 lb 12.8 oz (108.3 kg)    Physical Exam Constitutional:      General: He is not in acute distress.    Appearance: He is not diaphoretic.  Cardiovascular:     Rate and Rhythm: Normal rate and regular rhythm.     Heart sounds: Normal heart sounds.  Pulmonary:     Effort: Pulmonary effort is normal.     Breath sounds: Normal breath sounds.  Skin:    General: Skin is warm and dry.  Neurological:     Mental Status: He is alert.      Assessment/Plan: Please see individual problem list.  Problem List Items Addressed This Visit     Depression    Chronic issue.  Generally well controlled.  He will continue Lexapro 20 mg once daily and Wellbutrin 300 mg daily.  I discussed that physically he is doing quite well for a 76 year old.  He appreciated this.      Diabetes mellitus type 2 in obese [facility]) - Primary    He will return for an A1c when this is due.  This seems to be relatively well controlled.  He will continue metformin 1000 mg twice daily and Ozempic 0.5 mg once weekly.      Relevant Orders   HgB A1c   Fall    Discussed falls precautions.  I encouraged him to get new better fitting shoes.  Discussed removing area rugs.  I offered a referral for physical therapy though he defers this.  He will remain as active as possible to maintain his leg strength.      Memory loss    I encouraged him to start on B12 if he has not already done so.  We will recheck his B12 with his upcoming labs.      Relevant Orders   B12      Return in about 2 weeks (around 02/03/2022) for labs, 6 months PCP.  This visit occurred during the SARS-CoV-2 public health emergency.  Safety protocols were in place, including screening questions prior to the visit, additional usage of staff PPE, and extensive cleaning of exam room while observing appropriate contact time as indicated for disinfecting solutions.    [name], MD [place]

## 2022-01-20 NOTE — Patient Instructions (Signed)
Nice to see you. Please make sure you are taking an over the counter B12 1000 mcg supplement.

## 2022-01-23 ENCOUNTER — Other Ambulatory Visit: Payer: Self-pay

## 2022-01-23 MED FILL — Semaglutide Soln Pen-inj 0.25 or 0.5 MG/DOSE (2 MG/1.5ML): SUBCUTANEOUS | 84 days supply | Qty: 4.5 | Fill #1 | Status: AC

## 2022-01-28 ENCOUNTER — Other Ambulatory Visit: Payer: Self-pay | Admitting: Family Medicine

## 2022-01-28 MED FILL — Lisinopril & Hydrochlorothiazide Tab 10-12.5 MG: ORAL | 90 days supply | Qty: 90 | Fill #1 | Status: AC

## 2022-01-30 ENCOUNTER — Other Ambulatory Visit: Payer: Self-pay

## 2022-01-30 MED ORDER — ATORVASTATIN CALCIUM 40 MG PO TABS
40.0000 mg | ORAL_TABLET | Freq: Every day | ORAL | 1 refills | Status: DC
  Filled 2022-01-30: qty 90, 90d supply, fill #0
  Filled 2022-06-05: qty 90, 90d supply, fill #1

## 2022-01-30 MED ORDER — LEVOTHYROXINE SODIUM 50 MCG PO TABS
50.0000 ug | ORAL_TABLET | Freq: Every day | ORAL | 0 refills | Status: DC
  Filled 2022-01-30: qty 90, 90d supply, fill #0

## 2022-01-30 MED ORDER — MELOXICAM 15 MG PO TABS
ORAL_TABLET | Freq: Every day | ORAL | 1 refills | Status: DC | PRN
  Filled 2022-01-30: qty 30, 30d supply, fill #0
  Filled 2022-03-14: qty 30, 30d supply, fill #1

## 2022-02-08 ENCOUNTER — Other Ambulatory Visit (INDEPENDENT_AMBULATORY_CARE_PROVIDER_SITE_OTHER): Payer: No Typology Code available for payment source

## 2022-02-08 ENCOUNTER — Other Ambulatory Visit: Payer: Self-pay

## 2022-02-08 DIAGNOSIS — R413 Other amnesia: Secondary | ICD-10-CM | POA: Diagnosis not present

## 2022-02-08 DIAGNOSIS — E1169 Type 2 diabetes mellitus with other specified complication: Secondary | ICD-10-CM

## 2022-02-08 DIAGNOSIS — E669 Obesity, unspecified: Secondary | ICD-10-CM | POA: Diagnosis not present

## 2022-02-08 LAB — VITAMIN B12: Vitamin B-12: 439 pg/mL (ref 211–911)

## 2022-02-08 LAB — HEMOGLOBIN A1C: Hgb A1c MFr Bld: 6.7 % — ABNORMAL HIGH (ref 4.6–6.5)

## 2022-03-14 ENCOUNTER — Other Ambulatory Visit: Payer: Self-pay

## 2022-03-26 ENCOUNTER — Other Ambulatory Visit: Payer: Self-pay | Admitting: Internal Medicine

## 2022-03-27 ENCOUNTER — Other Ambulatory Visit: Payer: Self-pay | Admitting: Internal Medicine

## 2022-03-27 ENCOUNTER — Other Ambulatory Visit: Payer: Self-pay

## 2022-03-27 MED FILL — Bupropion HCl Tab ER 24HR 300 MG: ORAL | 90 days supply | Qty: 90 | Fill #0 | Status: AC

## 2022-04-24 ENCOUNTER — Other Ambulatory Visit: Payer: Self-pay | Admitting: Family Medicine

## 2022-04-25 ENCOUNTER — Other Ambulatory Visit: Payer: Self-pay

## 2022-04-25 MED ORDER — MELOXICAM 15 MG PO TABS
ORAL_TABLET | Freq: Every day | ORAL | 1 refills | Status: DC | PRN
  Filled 2022-04-25: qty 30, 30d supply, fill #0
  Filled 2022-06-05: qty 30, 30d supply, fill #1

## 2022-05-30 ENCOUNTER — Other Ambulatory Visit: Payer: Self-pay | Admitting: Family Medicine

## 2022-05-30 ENCOUNTER — Other Ambulatory Visit: Payer: Self-pay

## 2022-05-30 MED FILL — Semaglutide Soln Pen-inj 0.25 or 0.5 MG/DOSE (2 MG/3ML): SUBCUTANEOUS | 28 days supply | Qty: 3 | Fill #0 | Status: CN

## 2022-05-31 ENCOUNTER — Other Ambulatory Visit: Payer: Self-pay

## 2022-05-31 MED FILL — Semaglutide Soln Pen-inj 0.25 or 0.5 MG/DOSE (2 MG/3ML): SUBCUTANEOUS | 84 days supply | Qty: 9 | Fill #0 | Status: AC

## 2022-06-05 ENCOUNTER — Other Ambulatory Visit: Payer: Self-pay | Admitting: Family Medicine

## 2022-06-07 ENCOUNTER — Other Ambulatory Visit: Payer: Self-pay

## 2022-06-07 MED ORDER — LISINOPRIL-HYDROCHLOROTHIAZIDE 10-12.5 MG PO TABS
1.0000 | ORAL_TABLET | Freq: Every day | ORAL | 1 refills | Status: DC
  Filled 2022-06-07: qty 90, 90d supply, fill #0
  Filled 2022-10-22: qty 90, 90d supply, fill #1

## 2022-06-07 MED ORDER — TAMSULOSIN HCL 0.4 MG PO CAPS
0.8000 mg | ORAL_CAPSULE | Freq: Every day | ORAL | 11 refills | Status: DC
  Filled 2022-06-07: qty 60, 30d supply, fill #0
  Filled 2022-09-05: qty 60, 30d supply, fill #1
  Filled 2022-12-06: qty 60, 30d supply, fill #2

## 2022-06-17 ENCOUNTER — Other Ambulatory Visit: Payer: Self-pay | Admitting: Family Medicine

## 2022-06-18 ENCOUNTER — Other Ambulatory Visit: Payer: Self-pay

## 2022-06-18 MED ORDER — LEVOTHYROXINE SODIUM 50 MCG PO TABS
50.0000 ug | ORAL_TABLET | Freq: Every day | ORAL | 0 refills | Status: DC
  Filled 2022-06-18: qty 90, 90d supply, fill #0

## 2022-06-19 ENCOUNTER — Other Ambulatory Visit: Payer: Self-pay

## 2022-07-16 ENCOUNTER — Other Ambulatory Visit: Payer: Self-pay | Admitting: Family Medicine

## 2022-07-16 MED ORDER — MELOXICAM 15 MG PO TABS
ORAL_TABLET | Freq: Every day | ORAL | 1 refills | Status: DC | PRN
  Filled 2022-07-16: qty 30, 30d supply, fill #0
  Filled 2022-08-27: qty 30, 30d supply, fill #1

## 2022-07-16 MED ORDER — ESCITALOPRAM OXALATE 20 MG PO TABS
20.0000 mg | ORAL_TABLET | Freq: Every day | ORAL | 1 refills | Status: DC
Start: 2022-07-16 — End: 2023-03-15
  Filled 2022-07-16: qty 90, 90d supply, fill #0
  Filled 2022-11-25: qty 90, 90d supply, fill #1

## 2022-07-17 ENCOUNTER — Other Ambulatory Visit: Payer: Self-pay

## 2022-07-25 ENCOUNTER — Ambulatory Visit: Payer: No Typology Code available for payment source | Admitting: Family Medicine

## 2022-08-02 ENCOUNTER — Ambulatory Visit (INDEPENDENT_AMBULATORY_CARE_PROVIDER_SITE_OTHER): Payer: No Typology Code available for payment source | Admitting: Family Medicine

## 2022-08-02 ENCOUNTER — Other Ambulatory Visit: Payer: Self-pay

## 2022-08-02 ENCOUNTER — Encounter: Payer: Self-pay | Admitting: Family Medicine

## 2022-08-02 VITALS — BP 110/80 | HR 58 | Temp 98.5°F | Ht 71.0 in | Wt 240.8 lb

## 2022-08-02 DIAGNOSIS — E1169 Type 2 diabetes mellitus with other specified complication: Secondary | ICD-10-CM

## 2022-08-02 DIAGNOSIS — R413 Other amnesia: Secondary | ICD-10-CM | POA: Diagnosis not present

## 2022-08-02 DIAGNOSIS — H9190 Unspecified hearing loss, unspecified ear: Secondary | ICD-10-CM | POA: Insufficient documentation

## 2022-08-02 DIAGNOSIS — L0390 Cellulitis, unspecified: Secondary | ICD-10-CM | POA: Insufficient documentation

## 2022-08-02 DIAGNOSIS — E039 Hypothyroidism, unspecified: Secondary | ICD-10-CM

## 2022-08-02 DIAGNOSIS — L03116 Cellulitis of left lower limb: Secondary | ICD-10-CM | POA: Diagnosis not present

## 2022-08-02 DIAGNOSIS — Z0001 Encounter for general adult medical examination with abnormal findings: Secondary | ICD-10-CM | POA: Diagnosis not present

## 2022-08-02 DIAGNOSIS — I10 Essential (primary) hypertension: Secondary | ICD-10-CM

## 2022-08-02 DIAGNOSIS — E559 Vitamin D deficiency, unspecified: Secondary | ICD-10-CM | POA: Diagnosis not present

## 2022-08-02 DIAGNOSIS — E669 Obesity, unspecified: Secondary | ICD-10-CM | POA: Diagnosis not present

## 2022-08-02 DIAGNOSIS — S80862A Insect bite (nonvenomous), left lower leg, initial encounter: Secondary | ICD-10-CM

## 2022-08-02 DIAGNOSIS — H9193 Unspecified hearing loss, bilateral: Secondary | ICD-10-CM

## 2022-08-02 DIAGNOSIS — Z23 Encounter for immunization: Secondary | ICD-10-CM | POA: Diagnosis not present

## 2022-08-02 DIAGNOSIS — W57XXXA Bitten or stung by nonvenomous insect and other nonvenomous arthropods, initial encounter: Secondary | ICD-10-CM

## 2022-08-02 DIAGNOSIS — E66811 Obesity, class 1: Secondary | ICD-10-CM

## 2022-08-02 LAB — COMPREHENSIVE METABOLIC PANEL
ALT: 35 U/L (ref 0–53)
AST: 30 U/L (ref 0–37)
Albumin: 4.1 g/dL (ref 3.5–5.2)
Alkaline Phosphatase: 64 U/L (ref 39–117)
BUN: 16 mg/dL (ref 6–23)
CO2: 24 mEq/L (ref 19–32)
Calcium: 9.2 mg/dL (ref 8.4–10.5)
Chloride: 103 mEq/L (ref 96–112)
Creatinine, Ser: 0.88 mg/dL (ref 0.40–1.50)
GFR: 82.69 mL/min (ref >60.00)
Glucose, Bld: 158 mg/dL — ABNORMAL HIGH (ref 70–99)
Potassium: 4.3 mEq/L (ref 3.5–5.1)
Sodium: 138 mEq/L (ref 135–145)
Total Bilirubin: 0.6 mg/dL (ref 0.2–1.2)
Total Protein: 6.4 g/dL (ref 6.0–8.3)

## 2022-08-02 LAB — LIPID PANEL
Cholesterol: 140 mg/dL (ref 0–200)
HDL: 58.3 mg/dL (ref >39.00)
LDL Cholesterol: 46 mg/dL (ref 0–99)
NonHDL: 81.36
Total CHOL/HDL Ratio: 2
Triglycerides: 176 mg/dL — ABNORMAL HIGH (ref 0.0–149.0)
VLDL: 35.2 mg/dL (ref 0.0–40.0)

## 2022-08-02 LAB — VITAMIN B12: Vitamin B-12: 381 pg/mL (ref 211–911)

## 2022-08-02 LAB — VITAMIN D 25 HYDROXY (VIT D DEFICIENCY, FRACTURES): VITD: 15.33 ng/mL — ABNORMAL LOW (ref 30.00–100.00)

## 2022-08-02 LAB — HEMOGLOBIN A1C: Hgb A1c MFr Bld: 6.5 % (ref 4.6–6.5)

## 2022-08-02 LAB — TSH: TSH: 1.26 u[IU]/mL (ref 0.35–5.50)

## 2022-08-02 MED ORDER — CEFADROXIL 500 MG PO CAPS
500.0000 mg | ORAL_CAPSULE | Freq: Two times a day (BID) | ORAL | 0 refills | Status: DC
Start: 2022-08-02 — End: 2023-03-15
  Filled 2022-08-02: qty 14, 7d supply, fill #0

## 2022-08-02 NOTE — Assessment & Plan Note (Addendum)
Physical exam completed.  I encouraged healthy diet and remaining active.  Tetanus vaccine given given his potential spider bite.  He will check on cost of Shingrix vaccination with his insurance.  Discussed trying to limit alcohol intake to no more than 3 beverages at a time and no more than 14/week.  Discussed 1.5 ounces is the serving size for liquor.  Lab work as outlined.  He was encouraged to schedule an appointment with his eye doctor.

## 2022-08-02 NOTE — Assessment & Plan Note (Deleted)
Checking A1c.

## 2022-08-02 NOTE — Assessment & Plan Note (Signed)
Offered referral for evaluation though the patient declines.

## 2022-08-02 NOTE — Progress Notes (Signed)
[name], MD Phone: [phone]  Timothy Edwards is a 76 y.o. male who presents today for CPE.   Diet: sloppy diet Exercise: walks dogs, mows grass Colonoscopy: aged out, declined in the past Prostate cancer screening: aged out Family history-  Prostate cancer: possibly dad  Colon cancer: uncle Vaccines-   Flu: declined  Tetanus: due  Shingles: due  COVID19: declined boosters  Pneumonia: UTD Hep C Screening: UTD Tobacco use: no Alcohol use: some days, notes he just pours vodka in to fill a cup when he does drink Illicit Drug use: no Dentist: no, has dentures Ophthalmology: due  Insect bite: Patient notes he was bit on the inner portion of his left lower leg.  He is not sure what bit him.  Since then he has had significant erythema in the area and more proximally up his leg and towards his ankle.  He notes there is pain in the area.  There is warmth.  Patient does report some potential issues hearing his daughter and wife speak at times.   Active Ambulatory Problems    Diagnosis Date Noted   Hypertension 10/11/2011   Hyperlipidemia 10/11/2011   Hypothyroidism 10/11/2011   Generalized anxiety disorder 10/11/2011   Allergic rhinitis 10/11/2011   Overweight 10/11/2011   Vitamin D deficiency    Diabetes mellitus type 2 in obese ([place]) 03/18/2013   Memory loss 05/05/2013   Elevated LFTs 07/27/2014   Arthritis 01/26/2015   Depression 04/27/2015   Dermatitis 01/07/2018   BPH (benign prostatic hyperplasia) 11/11/2018   Osteoarthritis of knee 05/15/2016   Tear of meniscus of knee 05/15/2016   Fall 01/20/2022   Encounter for general adult medical examination with abnormal findings 08/02/2022   Insect bite of left lower leg 08/02/2022   Obesity (BMI 30.0-34.9) 08/02/2022   Cellulitis 08/02/2022   Hearing difficulty 08/02/2022   Resolved Ambulatory Problems    Diagnosis Date Noted   Left lateral knee pain 05/11/2016   Neck stiffness 12/29/2016   Impacted  esophageal foreign body 01/07/2018   Febrile illness 09/22/2021   Past Medical History:  Diagnosis Date   Diabetes mellitus without complication ([place])    Unspecified hypothyroidism    Vitamin D deficiency     Family History  Problem Relation Age of Onset   Obesity Father    Aneurysm Father    Hypertension Father    Gallbladder disease Mother     Social History   Socioeconomic History   Marital status: Married    Spouse name: Not on file   Number of children: Not on file   Years of education: Not on file   Highest education level: Not on file  Occupational History   Not on file  Tobacco Use   Smoking status: Never   Smokeless tobacco: Never  Vaping Use   Vaping Use: Never used  Substance and Sexual Activity   Alcohol use: Yes    Alcohol/week: 3.0 standard drinks of alcohol    Types: 3 Glasses of wine per week   Drug use: Never   Sexual activity: Not on file  Other Topics Concern   Not on file  Social History Narrative   Not on file   Social Determinants of Health   Financial Resource Strain: Not on file  Food Insecurity: Not on file  Transportation Needs: Not on file  Physical Activity: Not on file  Stress: Not on file  Social Connections: Not on file  Intimate Partner Violence: Not on file    ROS  General:  Negative for nexplained weight loss, fever Skin: Negative for new or changing mole, sore that won't heal HEENT: Positive for trouble hearing, trouble seeing, negative for ringing in ears, mouth sores, hoarseness, change in voice, dysphagia. CV:  Negative for chest pain, dyspnea, edema, palpitations Resp: Negative for cough, dyspnea, hemoptysis GI: Negative for nausea, vomiting, diarrhea, constipation, abdominal pain, melena, hematochezia. GU: Positive for frequent urination, negative for dysuria, incontinence, urinary hesitance, hematuria, vaginal or penile discharge, sexual difficulty, lumps in testicle or breasts MSK: Negative for muscle cramps or  aches, joint pain or swelling Neuro: Negative for headaches, weakness, numbness, dizziness, passing out/fainting Psych: Positive for memory problems (chronic), negative for depression, anxiety, memory problems  Objective  Physical Exam Vitals:   08/02/22 1203  BP: 110/80  Pulse: (!) 58  Temp: 98.5 F (36.9 C)  SpO2: 96%    BP Readings from Last 3 Encounters:  08/02/22 110/80  01/20/22 120/80  06/20/21 130/80   Wt Readings from Last 3 Encounters:  08/02/22 240 lb 12.8 oz (109.2 kg)  01/20/22 241 lb (109.3 kg)  09/21/21 238 lb (108 kg)    Physical Exam Constitutional:      General: He is not in acute distress.    Appearance: He is not diaphoretic.  HENT:     Head: Normocephalic and atraumatic.  Cardiovascular:     Rate and Rhythm: Normal rate and regular rhythm.     Heart sounds: Normal heart sounds.  Pulmonary:     Effort: Pulmonary effort is normal.     Breath sounds: Normal breath sounds.  Abdominal:     General: Bowel sounds are normal. There is no distension.     Palpations: Abdomen is soft.     Tenderness: There is no abdominal tenderness.  Musculoskeletal:     Right lower leg: No edema.     Left lower leg: No edema.       Legs:  Lymphadenopathy:     Cervical: No cervical adenopathy.  Skin:    General: Skin is warm and dry.  Neurological:     Mental Status: He is alert.      Assessment/Plan:   Problem List Items Addressed This Visit     Cellulitis    Patient has apparent cellulitis in his left lower extremity that may have originated from a spider bite or some other kind of insect bite.  We will treat him with cefadroxil 500 mg twice daily for 7 days.  He will follow-up in the office in 2 days for recheck.  If he has any spreading redness or develop systemic symptoms he will seek medical attention immediately.      Diabetes mellitus type 2 in obese ([place])   Relevant Orders   HgB A1c   Encounter for general adult medical examination with abnormal  findings - Primary    Physical exam completed.  I encouraged healthy diet and remaining active.  Tetanus vaccine given given his potential spider bite.  He will check on cost of Shingrix vaccination with his insurance.  Discussed trying to limit alcohol intake to no more than 3 beverages at a time and no more than 14/week.  Discussed 1.5 ounces is the serving size for liquor.  Lab work as outlined.  He was encouraged to schedule an appointment with his eye doctor.      Hearing difficulty    Offered referral for evaluation though the patient declines.      Hypertension   Relevant Orders   Comp  Met (CMET)   Lipid panel   Hypothyroidism   Relevant Orders   TSH   Insect bite of left lower leg   Relevant Orders   Td : Tetanus/diphtheria >7yo Preservative  free (Completed)   Memory loss   Relevant Orders   B12   Obesity (BMI 30.0-34.9)   Vitamin D deficiency   Relevant Orders   Vitamin D (25 hydroxy)    Return in about 2 days (around 08/04/2022) for recheck leg, 3 months with PCP for DM.   [name], MD [place]

## 2022-08-02 NOTE — Assessment & Plan Note (Signed)
Patient has apparent cellulitis in his left lower extremity that may have originated from a spider bite or some other kind of insect bite.  We will treat him with cefadroxil 500 mg twice daily for 7 days.  He will follow-up in the office in 2 days for recheck.  If he has any spreading redness or develop systemic symptoms he will seek medical attention immediately.

## 2022-08-02 NOTE — Patient Instructions (Signed)
Nice to see you. We will start you on an antibiotic to take twice daily for the next 7 days. I will see you back in a couple days to check your leg. If you have fevers or spreading redness on the antibiotic please seek medical attention.

## 2022-08-04 ENCOUNTER — Ambulatory Visit (INDEPENDENT_AMBULATORY_CARE_PROVIDER_SITE_OTHER): Payer: No Typology Code available for payment source | Admitting: Family Medicine

## 2022-08-04 ENCOUNTER — Ambulatory Visit
Admission: RE | Admit: 2022-08-04 | Discharge: 2022-08-04 | Disposition: A | Discharge status: Home / Self Care | Payer: No Typology Code available for payment source | Source: Ambulatory Visit | Attending: Family Medicine | Admitting: Family Medicine

## 2022-08-04 ENCOUNTER — Other Ambulatory Visit: Payer: Self-pay

## 2022-08-04 ENCOUNTER — Telehealth: Payer: Self-pay | Admitting: Family Medicine

## 2022-08-04 ENCOUNTER — Encounter: Payer: Self-pay | Admitting: Family Medicine

## 2022-08-04 VITALS — BP 150/80 | HR 80 | Temp 98.9°F | Ht 71.0 in | Wt 241.2 lb

## 2022-08-04 DIAGNOSIS — M79662 Pain in left lower leg: Secondary | ICD-10-CM | POA: Diagnosis present

## 2022-08-04 DIAGNOSIS — M7989 Other specified soft tissue disorders: Secondary | ICD-10-CM

## 2022-08-04 DIAGNOSIS — L03116 Cellulitis of left lower limb: Secondary | ICD-10-CM

## 2022-08-04 MED ORDER — DOXYCYCLINE HYCLATE 100 MG PO TABS
100.0000 mg | ORAL_TABLET | Freq: Two times a day (BID) | ORAL | 0 refills | Status: DC
  Filled 2022-08-04: qty 14, 7d supply, fill #0

## 2022-08-04 NOTE — Progress Notes (Signed)
[name], MD Phone: [phone]  Timothy Edwards is a 76 y.o. male who presents today for f/u.  Cellulitis: Patient presents for follow-up of cellulitis in his left lower extremity.  He notes the pain is slightly better.  He is not sure if the erythema has improved much.  He continues on cefadroxil.  He has developed some swelling though he was not overtly aware of this prior to coming in today.  Social History   Tobacco Use  Smoking Status Never  Smokeless Tobacco Never    Current Outpatient Medications on File Prior to Visit  Medication Sig Dispense Refill   atorvastatin (LIPITOR) 40 MG tablet Take 1 tablet (40 mg total) by mouth daily. 90 tablet 1   Blood Glucose Monitoring Suppl (ONE TOUCH BASIC SYSTEM) W/DEVICE KIT To use to check blood sugars once daily 1 each 0   buPROPion (WELLBUTRIN XL) 300 MG 24 hr tablet TAKE 1 TABLET BY MOUTH DAILY. 90 tablet 1   cefadroxil (DURICEF) 500 MG capsule Take 1 capsule (500 mg total) by mouth 2 (two) times daily. 14 capsule 0   cholecalciferol (VITAMIN D) 1000 UNITS tablet Take 1,000 Units by mouth daily.     clobetasol ointment (TEMOVATE) 0.05 % Apply topically 2 (two) times daily. 30 g 0   escitalopram (LEXAPRO) 20 MG tablet Take 1 tablet (20 mg total) by mouth daily. 90 tablet 1   fluticasone (FLONASE) 50 MCG/ACT nasal spray Place 2 sprays into both nostrils daily. 16 g 6   glucose blood test strip To use to check blood sugar once daily 100 each 12   levothyroxine (SYNTHROID) 50 MCG tablet Take 1 tablet (50 mcg total) by mouth daily. 90 tablet 0   lisinopril-hydrochlorothiazide (ZESTORETIC) 10-12.5 MG tablet Take 1 tablet by mouth daily. 90 tablet 1   meloxicam (MOBIC) 15 MG tablet Take one tablet by mouth daily as needed. 30 tablet 1   metFORMIN (GLUCOPHAGE) 500 MG tablet TAKE 2 TABLETS BY MOUTH IN AM AND 1 IN PM FOR 7 DAYS, THEN INCREASE TO 2 TABLETS IN AM AND 2 TABLETS IN PM 360 tablet 1   omeprazole (PRILOSEC) 40 MG capsule  Take 1 capsule (40 mg total) by mouth daily. 180 capsule 2   ONE TOUCH LANCETS MISC To use to check blood sugar once daily 100 each 3   Semaglutide,0.25 or 0.5MG/DOS, (OZEMPIC, 0.25 OR 0.5 MG/DOSE,) 2 MG/3ML SOPN Inject 0.5 mg into the skin once a week. 9 mL 1   tamsulosin (FLOMAX) 0.4 MG CAPS capsule Take 2 capsules (0.8 mg total) by mouth daily. 60 capsule 11   No current facility-administered medications on file prior to visit.     ROS see history of present illness  Objective  Physical Exam Vitals:   08/04/22 1330  BP: (!) 150/80  Pulse: 80  Temp: 98.9 F (37.2 C)  SpO2: 95%    BP Readings from Last 3 Encounters:  08/04/22 (!) 150/80  08/02/22 110/80  01/20/22 120/80   Wt Readings from Last 3 Encounters:  08/04/22 241 lb 3.2 oz (109.4 kg)  08/02/22 240 lb 12.8 oz (109.2 kg)  01/20/22 241 lb (109.3 kg)    Physical Exam Musculoskeletal:       Legs:     Comments: Some slight purplish hue in the distal area of the area of erythema      Assessment/Plan: Please see individual problem list.  Problem List Items Addressed This Visit     Cellulitis    Area  is still concerning for cellulitis.  We will add on doxycycline and he will continue cefadroxil.  We will evaluate for blood clot with an ultrasound given the onset of swelling.  If he has any worsening symptoms he will go to the emergency department.  He will follow-up next week for recheck with one of the other providers.      Relevant Medications   doxycycline (VIBRA-TABS) 100 MG tablet   Pain and swelling of left lower leg - Primary    Potentially related to cellulitis though given the swelling there is some concern for a blood clot.  He will have an ultrasound today to evaluate this further.      Relevant Orders   US Venous Img Lower Unilateral Left   Return in about 4 days (around 08/08/2022) for May follow-up with any provider that has an available appointment for recheck of his leg.   [name],  MD [place]

## 2022-08-04 NOTE — Telephone Encounter (Signed)
Lvm for patient to call back.  Skylan Gift,cma  

## 2022-08-04 NOTE — Assessment & Plan Note (Signed)
Area is still concerning for cellulitis.  We will add on doxycycline and he will continue cefadroxil.  We will evaluate for blood clot with an ultrasound given the onset of swelling.  If he has any worsening symptoms he will go to the emergency department.  He will follow-up next week for recheck with one of the other providers.

## 2022-08-04 NOTE — Patient Instructions (Signed)
Nice to see you. We will get an ultrasound today to evaluate for blood clot.  I sent in doxycycline for you to start on you will take this in addition to the cefadroxil.  If you have spreading redness or any worsening symptoms over the weekend you need to be evaluated in the emergency department.

## 2022-08-04 NOTE — Telephone Encounter (Signed)
Timothy Edwards from med center in [place] called stating the call report was ready in epic for pt leg to rule out a blood clot

## 2022-08-04 NOTE — Assessment & Plan Note (Signed)
Potentially related to cellulitis though given the swelling there is some concern for a blood clot.  He will have an ultrasound today to evaluate this further.

## 2022-08-06 ENCOUNTER — Telehealth: Payer: No Typology Code available for payment source | Admitting: Nurse Practitioner

## 2022-08-06 DIAGNOSIS — L03116 Cellulitis of left lower limb: Secondary | ICD-10-CM | POA: Diagnosis not present

## 2022-08-06 MED ORDER — DOXYCYCLINE HYCLATE 100 MG PO TABS: 100.0000 mg | ORAL_TABLET | Freq: Two times a day (BID) | ORAL | 0 refills | Status: DC

## 2022-08-06 NOTE — Progress Notes (Signed)
E Visit for Cellulitis  We are sorry that you are not feeling well. Here is how we plan to help! .   I have resent your doxycycline abx to the pharmacy you requested.   HOME CARE:  Take your medications as ordered and take all of them, even if the skin irritation appears to be healing.   GET HELP RIGHT AWAY IF:  Symptoms that don't begin to go away within 48 hours. Severe redness persists or worsens If the area turns color, spreads or swells. If it blisters and opens, develops yellow-brown crust or bleeds. You develop a fever or chills. If the pain increases or becomes unbearable.  Are unable to keep fluids and food down.  MAKE SURE YOU   Understand these instructions. Will watch your condition. Will get help right away if you are not doing well or get worse.  Thank you for choosing an e-visit.  Your e-visit answers were reviewed by a board certified advanced clinical practitioner to complete your personal care plan. Depending upon the condition, your plan could have included both over the counter or prescription medications.  Please review your pharmacy choice. Make sure the pharmacy is open so you can pick up prescription now. If there is a problem, you may contact your provider through [employer] and have the prescription routed to another pharmacy.  Your safety is important to [place]. If you have drug allergies check your prescription carefully.   For the next 24 hours you can use MyChart to ask questions about today's visit, request a non-urgent call back, or ask for a work or school excuse. You will get an email in the next two days asking about your experience. I hope that your e-visit has been valuable and will speed your recovery.

## 2022-08-06 NOTE — Progress Notes (Signed)
I have spent 5 minutes in review of e-visit questionnaire, review and updating patient chart, medical decision making and response to patient.  ° °Keslee Harrington W Gift Rueckert, NP ° °  °

## 2022-08-07 ENCOUNTER — Other Ambulatory Visit: Payer: Self-pay

## 2022-08-07 MED FILL — Bupropion HCl Tab ER 24HR 300 MG: ORAL | 90 days supply | Qty: 90 | Fill #1 | Status: AC

## 2022-08-08 ENCOUNTER — Other Ambulatory Visit: Payer: Self-pay

## 2022-08-08 ENCOUNTER — Telehealth: Payer: Self-pay

## 2022-08-08 NOTE — Telephone Encounter (Addendum)
Called pt in regards to [place] results. Unable to reach nor lvm due to vm being full.  Per Dr.Sonnenberg: Your ultrasound was negative for DVT.  This indicates that you do not have a DVT.  The swelling and redness is likely related to infection and you should take the doxycycline and cefadroxil as prescribed.   He was supposed to schedule follow-up for early next week (the week of Labor Day).  Since I am not here this could be arranged with any available provider.  He needs somebody to recheck his leg next week.  Please let the patient know his vitamin D is low.  Is he taking any vitamin D supplementation?  He will need prescription vitamin D supplementation even if he is taking a current supplement.  His other lab work is acceptable.

## 2022-08-09 ENCOUNTER — Other Ambulatory Visit: Payer: Self-pay | Admitting: Family

## 2022-08-09 ENCOUNTER — Other Ambulatory Visit: Payer: Self-pay

## 2022-08-09 MED ORDER — VITAMIN D (ERGOCALCIFEROL) 1.25 MG (50000 UNIT) PO CAPS
50000.0000 [IU] | ORAL_CAPSULE | ORAL | 0 refills | Status: DC
  Filled 2022-08-09: qty 12, 84d supply, fill #0

## 2022-08-10 ENCOUNTER — Encounter: Payer: Self-pay | Admitting: Internal Medicine

## 2022-08-11 ENCOUNTER — Encounter: Payer: Self-pay | Admitting: Internal Medicine

## 2022-08-11 ENCOUNTER — Ambulatory Visit (INDEPENDENT_AMBULATORY_CARE_PROVIDER_SITE_OTHER): Payer: No Typology Code available for payment source | Admitting: Internal Medicine

## 2022-08-11 DIAGNOSIS — L03116 Cellulitis of left lower limb: Secondary | ICD-10-CM

## 2022-08-11 NOTE — Patient Instructions (Addendum)
Your infection is resolving with current therapy  You may have some infection underneath that angry spot.  I want you to apply  hot compresses  (hot washcloth)  for 15 minutes 2 or 3 times daily  to allow infection to drain.    Do not lance or puncture that area .  It needs to drain ON ITS OWN.    Keep it covered with a sterile dressing and change it daily because it is draining   Continue the doxycycline    Return next week to make sure it is resolving

## 2022-08-11 NOTE — Progress Notes (Unsigned)
Subjective:  Patient ID: Timothy Edwards, male    DOB: 02-15-1946  Age: 76 y.o. MRN: [id number]  CC: There were no encounter diagnoses.   HPI [name] presents for follow up on cellulitis Chief Complaint  Patient presents with   Follow-up    Follow up on cellulitis Pt states he is doing much better   _0 76 yr old with type 2 DM  presents for follow up on cellulitis .  Symptoms several weeks ago started with swelling and redness to the left ankle.  At some point during the past 3 weeks he developed a localized papule and  Lanced it with a bayonette from his [place] gun (he did not sterilize).  He was seen by ES on August 30 and given tetanus booster and treated initially with Duracef,  but the cellulitis did not improve.  Set for U/s to rule ouf DVT on Sept.   2nd round started , doxycycline on  Sept 3,  and redness has improved.    Lab Results  Component Value Date   HGBA1C 6.5 08/02/2022      Outpatient Medications Prior to Visit  Medication Sig Dispense Refill   atorvastatin (LIPITOR) 40 MG tablet Take 1 tablet (40 mg total) by mouth daily. 90 tablet 1   Blood Glucose Monitoring Suppl (ONE TOUCH BASIC SYSTEM) W/DEVICE KIT To use to check blood sugars once daily 1 each 0   buPROPion (WELLBUTRIN XL) 300 MG 24 hr tablet TAKE 1 TABLET BY MOUTH DAILY. 90 tablet 1   cefadroxil (DURICEF) 500 MG capsule Take 1 capsule (500 mg total) by mouth 2 (two) times daily. 14 capsule 0   cholecalciferol (VITAMIN D) 1000 UNITS tablet Take 1,000 Units by mouth daily.     clobetasol ointment (TEMOVATE) 0.05 % Apply topically 2 (two) times daily. 30 g 0   doxycycline (VIBRA-TABS) 100 MG tablet Take 1 tablet (100 mg total) by mouth 2 (two) times daily. 14 tablet 0   escitalopram (LEXAPRO) 20 MG tablet Take 1 tablet (20 mg total) by mouth daily. 90 tablet 1   glucose blood test strip To use to check blood sugar once daily 100 each 12   levothyroxine (SYNTHROID) 50 MCG tablet Take 1  tablet (50 mcg total) by mouth daily. 90 tablet 0   lisinopril-hydrochlorothiazide (ZESTORETIC) 10-12.5 MG tablet Take 1 tablet by mouth daily. 90 tablet 1   meloxicam (MOBIC) 15 MG tablet Take one tablet by mouth daily as needed. 30 tablet 1   metFORMIN (GLUCOPHAGE) 500 MG tablet TAKE 2 TABLETS BY MOUTH IN AM AND 1 IN PM FOR 7 DAYS, THEN INCREASE TO 2 TABLETS IN AM AND 2 TABLETS IN PM 360 tablet 1   omeprazole (PRILOSEC) 40 MG capsule Take 1 capsule (40 mg total) by mouth daily. 180 capsule 2   ONE TOUCH LANCETS MISC To use to check blood sugar once daily 100 each 3   Semaglutide,0.25 or 0.5MG/DOS, (OZEMPIC, 0.25 OR 0.5 MG/DOSE,) 2 MG/3ML SOPN Inject 0.5 mg into the skin once a week. 9 mL 1   tamsulosin (FLOMAX) 0.4 MG CAPS capsule Take 2 capsules (0.8 mg total) by mouth daily. 60 capsule 11   Vitamin D, Ergocalciferol, (DRISDOL) 1.25 MG (50000 UNIT) CAPS capsule Take 1 capsule (50,000 Units total) by mouth every 7 (seven) days. 12 capsule 0   fluticasone (FLONASE) 50 MCG/ACT nasal spray Place 2 sprays into both nostrils daily. (Patient not taking: Reported on 08/11/2022) 16 g 6  No facility-administered medications prior to visit.    Review of Systems;  Patient denies headache, fevers, malaise, unintentional weight loss, skin rash, eye pain, sinus congestion and sinus pain, sore throat, dysphagia,  hemoptysis , cough, dyspnea, wheezing, chest pain, palpitations, orthopnea, edema, abdominal pain, nausea, melena, diarrhea, constipation, flank pain, dysuria, hematuria, urinary  Frequency, nocturia, numbness, tingling, seizures,  Focal weakness, Loss of consciousness,  Tremor, insomnia, depression, anxiety, and suicidal ideation.      Objective:  BP 130/86   Pulse 70   Temp 98.6 F (37 C) (Oral)   Resp 16   Ht 5' 11" (1.803 m)   Wt 241 lb 8 oz (109.5 kg)   SpO2 95%   BMI 33.68 kg/m   BP Readings from Last 3 Encounters:  08/11/22 130/86  08/04/22 (!) 150/80  08/02/22 110/80    Wt  Readings from Last 3 Encounters:  08/11/22 241 lb 8 oz (109.5 kg)  08/04/22 241 lb 3.2 oz (109.4 kg)  08/02/22 240 lb 12.8 oz (109.2 kg)    General appearance: alert, cooperative and appears stated age Ears: normal TM's and external ear canals both ears Throat: lips, mucosa, and tongue normal; teeth and gums normal Neck: no adenopathy, no carotid bruit, supple, symmetrical, trachea midline and thyroid not enlarged, symmetric, no tenderness/mass/nodules Back: symmetric, no curvature. ROM normal. No CVA tenderness. Lungs: clear to auscultation bilaterally Heart: regular rate and rhythm, S1, S2 normal, no murmur, click, rub or gallop Abdomen: soft, non-tender; bowel sounds normal; no masses,  no organomegaly Pulses: 2+ and symmetric Skin: Skin color, texture, turgor normal. No rashes or lesions Lymph nodes: Cervical, supraclavicular, and axillary nodes normal. Neuro:  awake and interactive with normal mood and affect. Higher cortical functions are normal. Speech is clear without word-finding difficulty or dysarthria. Extraocular movements are intact. Visual fields of both eyes are grossly intact. Sensation to light touch is grossly intact bilaterally of upper and lower extremities. Motor examination shows 4+/5 symmetric hand grip and upper extremity and 5/5 lower extremity strength. There is no pronation or drift. Gait is non-ataxic   Lab Results  Component Value Date   HGBA1C 6.5 08/02/2022   HGBA1C 6.7 (H) 02/08/2022   HGBA1C 6.7 (H) 10/26/2021    Lab Results  Component Value Date   CREATININE 0.88 08/02/2022   CREATININE 1.11 10/26/2021   CREATININE 0.94 03/21/2021    Lab Results  Component Value Date   WBC 5.3 08/25/2019   HGB 14.1 08/25/2019   HCT 41.5 08/25/2019   PLT 231.0 08/25/2019   GLUCOSE 158 (H) 08/02/2022   CHOL 140 08/02/2022   TRIG 176.0 (H) 08/02/2022   HDL 58.30 08/02/2022   LDLDIRECT 48.0 10/29/2019   LDLCALC 46 08/02/2022   ALT 35 08/02/2022   AST 30  08/02/2022   NA 138 08/02/2022   K 4.3 08/02/2022   CL 103 08/02/2022   CREATININE 0.88 08/02/2022   BUN 16 08/02/2022   CO2 24 08/02/2022   TSH 1.26 08/02/2022   PSA 0.07 (L) 11/11/2018   HGBA1C 6.5 08/02/2022   MICROALBUR 3.0 (H) 06/27/2016    US Venous Img Lower Unilateral Left  Result Date: 08/04/2022 CLINICAL DATA:  Left lower extremity edema and erythema EXAM: LEFT LOWER EXTREMITY VENOUS DOPPLER ULTRASOUND TECHNIQUE: Gray-scale sonography with compression, as well as color and duplex ultrasound, were performed to evaluate the deep venous system(s) from the level of the common femoral vein through the popliteal and proximal calf veins. COMPARISON:  None Available. FINDINGS: VENOUS  Normal compressibility of the common femoral, superficial femoral, and popliteal veins, as well as the visualized calf veins. Visualized portions of profunda femoral vein and great saphenous vein unremarkable. No filling defects to suggest DVT on grayscale or color Doppler imaging. Doppler waveforms show normal direction of venous flow, normal respiratory plasticity and response to augmentation. Limited views of the contralateral common femoral vein are unremarkable. OTHER None. Limitations: none IMPRESSION: Negative examination for deep venous thrombosis in the left lower extremity. Electronically Signed   By: Delanna Ahmadi M.D.   On: 08/04/2022 15:17    Assessment & Plan:   Problem List Items Addressed This Visit   None   I spent a total of   minutes with this patient in a face to face visit on the date of this encounter reviewing the last office visit with me in       ,  most recent visit with cardiology ,    ,  patient's diet and exercise habits, home blood pressure /blod sugar readings, recent ER visit including labs and imaging studies ,   and post visit ordering of testing and therapeutics.    Follow-up: No follow-ups on file.   Crecencio Mc, MD

## 2022-08-13 NOTE — Assessment & Plan Note (Addendum)
Resolved , with residual formation of hemorrhagic boil.  Nonfluctuant.  Continue doxcycyline  Add warm compresses weveral times daily

## 2022-08-18 ENCOUNTER — Encounter: Payer: Self-pay | Admitting: Family Medicine

## 2022-08-18 ENCOUNTER — Ambulatory Visit (INDEPENDENT_AMBULATORY_CARE_PROVIDER_SITE_OTHER): Payer: No Typology Code available for payment source | Admitting: Family Medicine

## 2022-08-18 DIAGNOSIS — Z872 Personal history of diseases of the skin and subcutaneous tissue: Secondary | ICD-10-CM

## 2022-08-18 DIAGNOSIS — L03116 Cellulitis of left lower limb: Secondary | ICD-10-CM

## 2022-08-18 NOTE — Progress Notes (Signed)
[name], MD Phone: [phone]  Timothy Edwards is a 76 y.o. male who presents today for follow-up.  Left leg cellulitis: Patient notes this has resolved.  He saw Dr. Derrel Nip for follow-up while I was out of the office and she noted 2 hemorrhagic boils.  He notes those have resolved as well.  He notes no pain.  He has finished the antibiotics.  Has not had any fevers.  He notes no side effects from taking the antibiotics.  Social History   Tobacco Use  Smoking Status Never  Smokeless Tobacco Never    Current Outpatient Medications on File Prior to Visit  Medication Sig Dispense Refill   atorvastatin (LIPITOR) 40 MG tablet Take 1 tablet (40 mg total) by mouth daily. 90 tablet 1   Blood Glucose Monitoring Suppl (ONE TOUCH BASIC SYSTEM) W/DEVICE KIT To use to check blood sugars once daily 1 each 0   buPROPion (WELLBUTRIN XL) 300 MG 24 hr tablet TAKE 1 TABLET BY MOUTH DAILY. 90 tablet 1   cefadroxil (DURICEF) 500 MG capsule Take 1 capsule (500 mg total) by mouth 2 (two) times daily. 14 capsule 0   cholecalciferol (VITAMIN D) 1000 UNITS tablet Take 1,000 Units by mouth daily.     clobetasol ointment (TEMOVATE) 0.05 % Apply topically 2 (two) times daily. 30 g 0   doxycycline (VIBRA-TABS) 100 MG tablet Take 1 tablet (100 mg total) by mouth 2 (two) times daily. 14 tablet 0   escitalopram (LEXAPRO) 20 MG tablet Take 1 tablet (20 mg total) by mouth daily. 90 tablet 1   fluticasone (FLONASE) 50 MCG/ACT nasal spray Place 2 sprays into both nostrils daily. 16 g 6   glucose blood test strip To use to check blood sugar once daily 100 each 12   levothyroxine (SYNTHROID) 50 MCG tablet Take 1 tablet (50 mcg total) by mouth daily. 90 tablet 0   lisinopril-hydrochlorothiazide (ZESTORETIC) 10-12.5 MG tablet Take 1 tablet by mouth daily. 90 tablet 1   meloxicam (MOBIC) 15 MG tablet Take one tablet by mouth daily as needed. 30 tablet 1   metFORMIN (GLUCOPHAGE) 500 MG tablet TAKE 2 TABLETS BY  MOUTH IN AM AND 1 IN PM FOR 7 DAYS, THEN INCREASE TO 2 TABLETS IN AM AND 2 TABLETS IN PM 360 tablet 1   omeprazole (PRILOSEC) 40 MG capsule Take 1 capsule (40 mg total) by mouth daily. 180 capsule 2   ONE TOUCH LANCETS MISC To use to check blood sugar once daily 100 each 3   Semaglutide,0.25 or 0.5MG/DOS, (OZEMPIC, 0.25 OR 0.5 MG/DOSE,) 2 MG/3ML SOPN Inject 0.5 mg into the skin once a week. 9 mL 1   tamsulosin (FLOMAX) 0.4 MG CAPS capsule Take 2 capsules (0.8 mg total) by mouth daily. 60 capsule 11   Vitamin D, Ergocalciferol, (DRISDOL) 1.25 MG (50000 UNIT) CAPS capsule Take 1 capsule (50,000 Units total) by mouth every 7 (seven) days. 12 capsule 0   No current facility-administered medications on file prior to visit.     ROS see history of present illness  Objective  Physical Exam Vitals:   08/18/22 1338  BP: 120/80  Pulse: 84  Temp: 98.4 F (36.9 C)  SpO2: 97%    BP Readings from Last 3 Encounters:  08/18/22 120/80  08/11/22 130/86  08/04/22 (!) 150/80   Wt Readings from Last 3 Encounters:  08/18/22 238 lb 12.8 oz (108.3 kg)  08/11/22 241 lb 8 oz (109.5 kg)  08/04/22 241 lb 3.2 oz (109.4 kg)  Physical Exam Musculoskeletal:     Comments: Left ankle with no evidence of active cellulitis, there is some skin flaking where his cellulitis was previously located      Assessment/Plan: Please see individual problem list.  Problem List Items Addressed This Visit     Cellulitis    Resolved.  He will monitor for any recurrent symptoms and let us know if they do recur.        Return if symptoms worsen or fail to improve.   [name], MD [place]

## 2022-08-18 NOTE — Assessment & Plan Note (Signed)
Resolved.  He will monitor for any recurrent symptoms and let us know if they do recur.

## 2022-08-27 ENCOUNTER — Other Ambulatory Visit: Payer: Self-pay

## 2022-08-28 ENCOUNTER — Other Ambulatory Visit: Payer: Self-pay

## 2022-08-30 ENCOUNTER — Other Ambulatory Visit: Payer: Self-pay

## 2022-08-30 MED FILL — Semaglutide Soln Pen-inj 0.25 or 0.5 MG/DOSE (2 MG/3ML): SUBCUTANEOUS | 28 days supply | Qty: 3 | Fill #1 | Status: AC

## 2022-09-06 ENCOUNTER — Other Ambulatory Visit: Payer: Self-pay

## 2022-10-16 ENCOUNTER — Other Ambulatory Visit: Payer: Self-pay | Admitting: Family Medicine

## 2022-10-16 ENCOUNTER — Other Ambulatory Visit: Payer: Self-pay | Admitting: Family

## 2022-10-16 DIAGNOSIS — E1169 Type 2 diabetes mellitus with other specified complication: Secondary | ICD-10-CM

## 2022-10-17 ENCOUNTER — Other Ambulatory Visit: Payer: Self-pay | Admitting: Family Medicine

## 2022-10-17 ENCOUNTER — Other Ambulatory Visit: Payer: Self-pay

## 2022-10-17 DIAGNOSIS — E1169 Type 2 diabetes mellitus with other specified complication: Secondary | ICD-10-CM

## 2022-10-18 ENCOUNTER — Other Ambulatory Visit: Payer: Self-pay

## 2022-10-18 DIAGNOSIS — E1169 Type 2 diabetes mellitus with other specified complication: Secondary | ICD-10-CM

## 2022-10-18 DIAGNOSIS — M1990 Unspecified osteoarthritis, unspecified site: Secondary | ICD-10-CM

## 2022-10-18 MED ORDER — METFORMIN HCL 500 MG PO TABS
ORAL_TABLET | ORAL | 1 refills | Status: DC
  Filled 2022-10-18: qty 360, 90d supply, fill #0

## 2022-10-18 MED ORDER — MELOXICAM 15 MG PO TABS
15.0000 mg | ORAL_TABLET | Freq: Every day | ORAL | 1 refills | Status: DC | PRN
  Filled 2022-10-18: qty 30, 30d supply, fill #0
  Filled 2022-11-25: qty 30, 30d supply, fill #1

## 2022-10-22 ENCOUNTER — Other Ambulatory Visit: Payer: Self-pay | Admitting: Family

## 2022-10-22 ENCOUNTER — Other Ambulatory Visit: Payer: Self-pay | Admitting: Family Medicine

## 2022-10-23 ENCOUNTER — Other Ambulatory Visit: Payer: Self-pay | Admitting: Family Medicine

## 2022-10-23 ENCOUNTER — Other Ambulatory Visit: Payer: Self-pay

## 2022-10-23 MED ORDER — ATORVASTATIN CALCIUM 40 MG PO TABS
40.0000 mg | ORAL_TABLET | Freq: Every day | ORAL | 1 refills | Status: DC
  Filled 2022-10-23: qty 90, 90d supply, fill #0

## 2022-10-23 MED FILL — Levothyroxine Sodium Tab 50 MCG: ORAL | 90 days supply | Qty: 90 | Fill #0 | Status: AC

## 2022-10-24 ENCOUNTER — Other Ambulatory Visit: Payer: Self-pay

## 2022-11-03 ENCOUNTER — Ambulatory Visit: Payer: No Typology Code available for payment source | Admitting: Family Medicine

## 2022-11-25 MED FILL — Semaglutide Soln Pen-inj 0.25 or 0.5 MG/DOSE (2 MG/3ML): SUBCUTANEOUS | 28 days supply | Qty: 3 | Fill #2 | Status: CN

## 2022-11-27 ENCOUNTER — Other Ambulatory Visit: Payer: Self-pay

## 2022-11-28 ENCOUNTER — Other Ambulatory Visit: Payer: Self-pay

## 2022-11-30 ENCOUNTER — Other Ambulatory Visit: Payer: Self-pay

## 2022-11-30 MED FILL — Semaglutide Soln Pen-inj 0.25 or 0.5 MG/DOSE (2 MG/3ML): SUBCUTANEOUS | 28 days supply | Qty: 3 | Fill #2 | Status: CN

## 2022-12-02 ENCOUNTER — Other Ambulatory Visit (HOSPITAL_COMMUNITY): Payer: Self-pay

## 2022-12-07 ENCOUNTER — Other Ambulatory Visit: Payer: Self-pay

## 2022-12-08 ENCOUNTER — Other Ambulatory Visit: Payer: Self-pay

## 2022-12-08 MED FILL — Semaglutide Soln Pen-inj 0.25 or 0.5 MG/DOSE (2 MG/3ML): SUBCUTANEOUS | 28 days supply | Qty: 3 | Fill #2 | Status: CN

## 2022-12-13 ENCOUNTER — Telehealth: Payer: Self-pay | Admitting: Family Medicine

## 2022-12-13 ENCOUNTER — Ambulatory Visit (INDEPENDENT_AMBULATORY_CARE_PROVIDER_SITE_OTHER): Payer: Medicare Other | Admitting: Family Medicine

## 2022-12-13 ENCOUNTER — Encounter: Payer: Self-pay | Admitting: Family Medicine

## 2022-12-13 ENCOUNTER — Other Ambulatory Visit: Payer: Self-pay

## 2022-12-13 VITALS — BP 140/70 | HR 73 | Temp 97.9°F | Ht 71.0 in | Wt 235.0 lb

## 2022-12-13 DIAGNOSIS — E785 Hyperlipidemia, unspecified: Secondary | ICD-10-CM

## 2022-12-13 DIAGNOSIS — E1169 Type 2 diabetes mellitus with other specified complication: Secondary | ICD-10-CM

## 2022-12-13 DIAGNOSIS — E039 Hypothyroidism, unspecified: Secondary | ICD-10-CM | POA: Diagnosis not present

## 2022-12-13 DIAGNOSIS — J989 Respiratory disorder, unspecified: Secondary | ICD-10-CM

## 2022-12-13 DIAGNOSIS — E669 Obesity, unspecified: Secondary | ICD-10-CM | POA: Diagnosis not present

## 2022-12-13 DIAGNOSIS — R5383 Other fatigue: Secondary | ICD-10-CM

## 2022-12-13 DIAGNOSIS — E559 Vitamin D deficiency, unspecified: Secondary | ICD-10-CM | POA: Diagnosis not present

## 2022-12-13 DIAGNOSIS — I10 Essential (primary) hypertension: Secondary | ICD-10-CM | POA: Diagnosis not present

## 2022-12-13 LAB — CBC WITH DIFFERENTIAL/PLATELET
Basophils Absolute: 0 10*3/uL (ref 0.0–0.1)
Basophils Relative: 0.8 % (ref 0.0–3.0)
Eosinophils Absolute: 0.2 10*3/uL (ref 0.0–0.7)
Eosinophils Relative: 4.8 % (ref 0.0–5.0)
HCT: 42.6 % (ref 39.0–52.0)
Hemoglobin: 14.5 g/dL (ref 13.0–17.0)
Lymphocytes Relative: 28.1 % (ref 12.0–46.0)
Lymphs Abs: 1.4 10*3/uL (ref 0.7–4.0)
MCHC: 34 g/dL (ref 30.0–36.0)
MCV: 91.3 fl (ref 78.0–100.0)
Monocytes Absolute: 0.4 10*3/uL (ref 0.1–1.0)
Monocytes Relative: 8.8 % (ref 3.0–12.0)
Neutro Abs: 2.9 10*3/uL (ref 1.4–7.7)
Neutrophils Relative %: 57.5 % (ref 43.0–77.0)
Platelets: 236 10*3/uL (ref 150.0–400.0)
RBC: 4.67 Mil/uL (ref 4.22–5.81)
RDW: 13.2 % (ref 11.5–15.5)
WBC: 5 10*3/uL (ref 4.0–10.5)

## 2022-12-13 LAB — COMPREHENSIVE METABOLIC PANEL
ALT: 35 U/L (ref 0–53)
AST: 34 U/L (ref 0–37)
Albumin: 4.3 g/dL (ref 3.5–5.2)
Alkaline Phosphatase: 63 U/L (ref 39–117)
BUN: 12 mg/dL (ref 6–23)
CO2: 28 mEq/L (ref 19–32)
Calcium: 9.5 mg/dL (ref 8.4–10.5)
Chloride: 102 mEq/L (ref 96–112)
Creatinine, Ser: 0.86 mg/dL (ref 0.40–1.50)
GFR: 83.05 mL/min (ref >60.00)
Glucose, Bld: 129 mg/dL — ABNORMAL HIGH (ref 70–99)
Potassium: 4.3 mEq/L (ref 3.5–5.1)
Sodium: 140 mEq/L (ref 135–145)
Total Bilirubin: 0.8 mg/dL (ref 0.2–1.2)
Total Protein: 6.7 g/dL (ref 6.0–8.3)

## 2022-12-13 LAB — LIPID PANEL
Cholesterol: 162 mg/dL (ref 0–200)
HDL: 66 mg/dL (ref >39.00)
LDL Cholesterol: 66 mg/dL (ref 0–99)
NonHDL: 96.27
Total CHOL/HDL Ratio: 2
Triglycerides: 152 mg/dL — ABNORMAL HIGH (ref 0.0–149.0)
VLDL: 30.4 mg/dL (ref 0.0–40.0)

## 2022-12-13 LAB — TSH: TSH: 1.97 u[IU]/mL (ref 0.35–5.50)

## 2022-12-13 LAB — VITAMIN D 25 HYDROXY (VIT D DEFICIENCY, FRACTURES): VITD: 30.94 ng/mL (ref 30.00–100.00)

## 2022-12-13 LAB — VITAMIN B12: Vitamin B-12: 636 pg/mL (ref 211–911)

## 2022-12-13 LAB — HEMOGLOBIN A1C: Hgb A1c MFr Bld: 6.4 % (ref 4.6–6.5)

## 2022-12-13 LAB — MICROALBUMIN / CREATININE URINE RATIO
Creatinine,U: 136.9 mg/dL
Microalb Creat Ratio: 2.2 mg/g (ref 0.0–30.0)
Microalb, Ur: 3 mg/dL — ABNORMAL HIGH (ref 0.0–1.9)

## 2022-12-13 LAB — TESTOSTERONE: Testosterone: 53.67 ng/dL — ABNORMAL LOW (ref 300.00–890.00)

## 2022-12-13 MED FILL — Semaglutide Soln Pen-inj 0.25 or 0.5 MG/DOSE (2 MG/3ML): SUBCUTANEOUS | 28 days supply | Qty: 3 | Fill #2 | Status: CN

## 2022-12-13 NOTE — Assessment & Plan Note (Signed)
Check vitamin D. 

## 2022-12-13 NOTE — Assessment & Plan Note (Signed)
Chronic issue.  He will continue Synthroid 50 mcg daily.  Check TSH.

## 2022-12-13 NOTE — Assessment & Plan Note (Signed)
Undetermined cause.  Will check lab work as outlined to evaluate for an underlying cause.

## 2022-12-13 NOTE — Telephone Encounter (Signed)
Pt need PA for ozempic

## 2022-12-13 NOTE — Assessment & Plan Note (Signed)
I discussed that he likely has a new respiratory illness and this should improve over several weeks.  If he is not improving over the next 1 to 2 weeks he will let us know and we can consider chest x-ray.

## 2022-12-13 NOTE — Assessment & Plan Note (Signed)
Chronic issue.  Adequately controlled for his age at home.  He will continue lisinopril-HCTZ 10-12.5 mg daily.

## 2022-12-13 NOTE — Assessment & Plan Note (Addendum)
Chronic issue.  Check A1c.  Continue metformin 1000 mg twice daily and Ozempic 0.5 mg weekly.  I encouraged the patient to get into see his eye doctor yearly.

## 2022-12-13 NOTE — Assessment & Plan Note (Signed)
Chronic issue.  Continue atorvastatin 40 mg daily.  Check lipid panel.

## 2022-12-13 NOTE — Progress Notes (Signed)
Timothy Rumps, MD Phone: [phone]  Timothy Edwards is a 77 y.o. male who presents today for f/u.  HYPERTENSION Disease Monitoring: Blood pressure range-120s/80 Chest pain- no      Dyspnea- no Medications: Compliance- taking lisinopril/HCTZ    Edema- no  DIABETES Disease Monitoring: Blood Sugar ranges-110 highest Polyuria/phagia/dipsia- some polydipsia      Optho- due Medications: Compliance- taking metformin, ozempic Hypoglycemic symptoms- no  HYPERLIPIDEMIA Disease Monitoring: See symptoms for Hypertension Medications: Compliance- taking lipitor Right upper quadrant pain- no  Muscle aches- no  HYPOTHYROIDISM Disease Monitoring Weight changes: no  Skin Changes: no Heat/Cold intolerance: no  Medication Monitoring Compliance:  taking synthroid   Last TSH:   Lab Results  Component Value Date   TSH 1.26 08/02/2022   Patient complains of lack of energy recently.    Respiratory illness: Patient reports he got sick back in the fall.  It took quite some time to get over this though he notes he was fine for several months though a couple of weeks ago started to get congested again in his chest.  He has minimal cough with this.  Patient is concerned that this may represent recurrence of the illness he had last fall.    Social History   Tobacco Use  Smoking Status Never  Smokeless Tobacco Never    Current Outpatient Medications on File Prior to Visit  Medication Sig Dispense Refill   atorvastatin (LIPITOR) 40 MG tablet Take 1 tablet (40 mg total) by mouth daily. 90 tablet 1   Blood Glucose Monitoring Suppl (ONE TOUCH BASIC SYSTEM) W/DEVICE KIT To use to check blood sugars once daily 1 each 0   buPROPion (WELLBUTRIN XL) 300 MG 24 hr tablet TAKE 1 TABLET BY MOUTH DAILY. 90 tablet 1   cefadroxil (DURICEF) 500 MG capsule Take 1 capsule (500 mg total) by mouth 2 (two) times daily. 14 capsule 0   cholecalciferol (VITAMIN D) 1000 UNITS tablet Take 1,000 Units by mouth  daily.     clobetasol ointment (TEMOVATE) 0.05 % Apply topically 2 (two) times daily. 30 g 0   doxycycline (VIBRA-TABS) 100 MG tablet Take 1 tablet (100 mg total) by mouth 2 (two) times daily. 14 tablet 0   escitalopram (LEXAPRO) 20 MG tablet Take 1 tablet (20 mg total) by mouth daily. 90 tablet 1   fluticasone (FLONASE) 50 MCG/ACT nasal spray Place 2 sprays into both nostrils daily. 16 g 6   glucose blood test strip To use to check blood sugar once daily 100 each 12   levothyroxine (SYNTHROID) 50 MCG tablet Take 1 tablet (50 mcg total) by mouth daily. 90 tablet 0   lisinopril-hydrochlorothiazide (ZESTORETIC) 10-12.5 MG tablet Take 1 tablet by mouth daily. 90 tablet 1   meloxicam (MOBIC) 15 MG tablet Take 1 tablet (15 mg total) by mouth daily as needed. 30 tablet 1   metFORMIN (GLUCOPHAGE) 500 MG tablet TAKE 2 TABLETS BY MOUTH EVERY MORNING AND 2 TABLETS EVERY EVENING 360 tablet 1   omeprazole (PRILOSEC) 40 MG capsule Take 1 capsule (40 mg total) by mouth daily. 180 capsule 2   ONE TOUCH LANCETS MISC To use to check blood sugar once daily 100 each 3   Semaglutide,0.25 or 0.5MG /DOS, (OZEMPIC, 0.25 OR 0.5 MG/DOSE,) 2 MG/3ML SOPN Inject 0.5 mg into the skin once a week. 9 mL 1   tamsulosin (FLOMAX) 0.4 MG CAPS capsule Take 2 capsules (0.8 mg total) by mouth daily. 60 capsule 11   Vitamin D, Ergocalciferol, (DRISDOL) 1.25  MG (50000 UNIT) CAPS capsule Take 1 capsule (50,000 Units total) by mouth every 7 (seven) days. 12 capsule 0   No current facility-administered medications on file prior to visit.     ROS see history of present illness  Objective  Physical Exam Vitals:   12/13/22 1209  BP: (!) 140/70  Pulse: 73  Temp: 97.9 F (36.6 C)  SpO2: 97%    BP Readings from Last 3 Encounters:  12/13/22 (!) 140/70  08/18/22 120/80  08/11/22 130/86   Wt Readings from Last 3 Encounters:  12/13/22 235 lb (106.6 kg)  08/18/22 238 lb 12.8 oz (108.3 kg)  08/11/22 241 lb 8 oz (109.5 kg)     Physical Exam Constitutional:      General: He is not in acute distress.    Appearance: He is not diaphoretic.  Cardiovascular:     Rate and Rhythm: Normal rate and regular rhythm.     Heart sounds: Normal heart sounds.  Pulmonary:     Effort: Pulmonary effort is normal.     Breath sounds: Normal breath sounds.  Skin:    General: Skin is warm and dry.  Neurological:     Mental Status: He is alert.      Assessment/Plan: Please see individual problem list.  Primary hypertension Assessment & Plan: Chronic issue.  Adequately controlled for his age at home.  He will continue lisinopril-HCTZ 10-12.5 mg daily.   Hypothyroidism, unspecified type Assessment & Plan: Chronic issue.  He will continue Synthroid 50 mcg daily.  Check TSH.  Orders: -     TSH  Diabetes mellitus type 2 in obese [facility]) Assessment & Plan: Chronic issue.  Check A1c.  Continue metformin 1000 mg twice daily and Ozempic 0.5 mg weekly.  I encouraged the patient to get into see his eye doctor yearly.  Orders: -     Hemoglobin A1c -     Vitamin B12 -     Microalbumin / creatinine urine ratio  Hyperlipidemia, unspecified hyperlipidemia type Assessment & Plan: Chronic issue.  Continue atorvastatin 40 mg daily.  Check lipid panel.  Orders: -     Comprehensive metabolic panel -     Lipid panel  Vitamin D deficiency Assessment & Plan: Check vitamin D.  Orders: -     VITAMIN D 25 Hydroxy (Vit-D Deficiency, Fractures)  Other fatigue Assessment & Plan: Undetermined cause.  Will check lab work as outlined to evaluate for an underlying cause.  Orders: -     Testosterone -     CBC with Differential/Platelet -     Vitamin B12  Respiratory illness Assessment & Plan: I discussed that he likely has a new respiratory illness and this should improve over several weeks.  If he is not improving over the next 1 to 2 weeks he will let us know and we can consider chest x-ray.      Return in about 6  months (around 06/13/2023) for htn, dm.   Timothy Rumps, MD [place]

## 2022-12-14 ENCOUNTER — Other Ambulatory Visit: Payer: Self-pay

## 2022-12-14 MED FILL — Semaglutide Soln Pen-inj 0.25 or 0.5 MG/DOSE (2 MG/3ML): SUBCUTANEOUS | 28 days supply | Qty: 3 | Fill #2 | Status: CN

## 2022-12-15 ENCOUNTER — Other Ambulatory Visit: Payer: Self-pay

## 2022-12-15 MED FILL — Semaglutide Soln Pen-inj 0.25 or 0.5 MG/DOSE (2 MG/3ML): SUBCUTANEOUS | 28 days supply | Qty: 3 | Fill #2 | Status: CN

## 2022-12-18 ENCOUNTER — Other Ambulatory Visit: Payer: Self-pay

## 2022-12-18 MED FILL — Semaglutide Soln Pen-inj 0.25 or 0.5 MG/DOSE (2 MG/3ML): SUBCUTANEOUS | 28 days supply | Qty: 3 | Fill #2 | Status: CN

## 2022-12-19 ENCOUNTER — Telehealth: Payer: Self-pay

## 2022-12-19 ENCOUNTER — Other Ambulatory Visit: Payer: Self-pay

## 2022-12-19 NOTE — Telephone Encounter (Signed)
Left message for Patient to call the office back regarding results

## 2022-12-20 ENCOUNTER — Other Ambulatory Visit: Payer: Self-pay

## 2022-12-20 ENCOUNTER — Other Ambulatory Visit: Payer: Self-pay | Admitting: Internal Medicine

## 2022-12-20 MED ORDER — BUPROPION HCL ER (XL) 300 MG PO TB24
300.0000 mg | ORAL_TABLET | Freq: Every day | ORAL | 1 refills | Status: DC
  Filled 2022-12-20: qty 90, 90d supply, fill #0

## 2022-12-20 NOTE — Telephone Encounter (Signed)
Pt returning call

## 2022-12-21 ENCOUNTER — Other Ambulatory Visit: Payer: Self-pay

## 2022-12-21 ENCOUNTER — Other Ambulatory Visit: Payer: Self-pay | Admitting: Family Medicine

## 2022-12-21 ENCOUNTER — Other Ambulatory Visit (INDEPENDENT_AMBULATORY_CARE_PROVIDER_SITE_OTHER): Payer: Medicare Other

## 2022-12-21 DIAGNOSIS — R7989 Other specified abnormal findings of blood chemistry: Secondary | ICD-10-CM

## 2022-12-21 LAB — TESTOSTERONE: Testosterone: 37.78 ng/dL — ABNORMAL LOW (ref 300.00–890.00)

## 2022-12-21 MED FILL — Semaglutide Soln Pen-inj 0.25 or 0.5 MG/DOSE (2 MG/3ML): SUBCUTANEOUS | 28 days supply | Qty: 3 | Fill #2 | Status: CN

## 2022-12-22 ENCOUNTER — Other Ambulatory Visit: Payer: Self-pay

## 2022-12-22 ENCOUNTER — Other Ambulatory Visit: Payer: Self-pay | Admitting: Family Medicine

## 2022-12-22 ENCOUNTER — Other Ambulatory Visit (HOSPITAL_COMMUNITY): Payer: Self-pay

## 2022-12-22 DIAGNOSIS — R7989 Other specified abnormal findings of blood chemistry: Secondary | ICD-10-CM

## 2022-12-22 NOTE — Telephone Encounter (Signed)
Pharmacy Patient Advocate Encounter   Received notification that prior authorization for Ozempic (0.25 or 0.5 MG/DOSE) 2MG /3ML pen-injectors is required/requested.  Per Test Claim: PA required   PA submitted on 12/22/22 to (ins) [facility] [employer] via CoverMyMeds [place] Status is pending

## 2022-12-22 NOTE — Telephone Encounter (Signed)
Patient Advocate Encounter  Prior Authorization for Ozempic (0.25 or 0.5 MG/DOSE) 2MG /3ML pen-injectors has been approved.    PA# [id number] Effective dates: 12/22/22 through until further notice

## 2022-12-28 ENCOUNTER — Other Ambulatory Visit: Payer: Self-pay

## 2022-12-29 ENCOUNTER — Telehealth: Payer: Self-pay | Admitting: Family Medicine

## 2022-12-29 DIAGNOSIS — R7989 Other specified abnormal findings of blood chemistry: Secondary | ICD-10-CM

## 2022-12-29 NOTE — Telephone Encounter (Signed)
Patient called and said [facility] at [facility] is no longer taking on new patients. He needs a referral to see someone else. [facility] did not offer anyone else to patient.

## 2022-12-29 NOTE — Telephone Encounter (Signed)
New referral placed.

## 2023-01-02 ENCOUNTER — Other Ambulatory Visit: Payer: Self-pay

## 2023-01-14 ENCOUNTER — Other Ambulatory Visit: Payer: Self-pay

## 2023-01-22 ENCOUNTER — Telehealth: Payer: Self-pay | Admitting: Family Medicine

## 2023-01-22 NOTE — Telephone Encounter (Signed)
Patient is changing his Pharmacy to [facility] in [place]. Please remove the [facility] pharmacy, no longer using.  Prescription Request  01/22/2023  Is this a "Controlled Substance" medicine? No  LOV: 12/13/2022  What is the name of the medication or equipment? meloxicam (MOBIC) 15 MG tablet and Ozempic. All other furture medication will also go to [facility] in [place].  Have you contacted your pharmacy to request a refill? No   Which pharmacy would you like this sent to?   [id number] [place] [facility] [id number]: [phone]: [phone]   Patient notified that their request is being sent to the clinical staff for review and that they should receive a response within 2 business days.   [place] at [place]

## 2023-01-23 ENCOUNTER — Other Ambulatory Visit: Payer: Self-pay

## 2023-01-23 DIAGNOSIS — M1990 Unspecified osteoarthritis, unspecified site: Secondary | ICD-10-CM

## 2023-01-23 MED ORDER — MELOXICAM 15 MG PO TABS: 15.0000 mg | ORAL_TABLET | Freq: Every day | ORAL | 1 refills | Status: DC | PRN

## 2023-01-23 NOTE — Telephone Encounter (Signed)
sent

## 2023-01-24 ENCOUNTER — Other Ambulatory Visit: Payer: Self-pay

## 2023-03-07 ENCOUNTER — Telehealth: Payer: Self-pay

## 2023-03-07 NOTE — Telephone Encounter (Signed)
Patient states he has changed pharmacies and would like to have all of his prescriptions sent to [facility] in [place].  Patient states he thinks some of his prescriptions have already been switched over, but all of them have not been changed.  Patient states he would like for [place] to leave a message on his cell phone when all of his prescriptions have been transferred.

## 2023-03-08 NOTE — Telephone Encounter (Signed)
Still awaiting return call with more information.

## 2023-03-15 ENCOUNTER — Other Ambulatory Visit: Payer: Self-pay | Admitting: *Deleted

## 2023-03-15 DIAGNOSIS — E669 Obesity, unspecified: Secondary | ICD-10-CM

## 2023-03-15 DIAGNOSIS — M1990 Unspecified osteoarthritis, unspecified site: Secondary | ICD-10-CM

## 2023-03-15 MED ORDER — BUPROPION HCL ER (XL) 300 MG PO TB24: 300.0000 mg | ORAL_TABLET | Freq: Every day | ORAL | 1 refills | Status: DC

## 2023-03-15 MED ORDER — ATORVASTATIN CALCIUM 40 MG PO TABS: 40.0000 mg | ORAL_TABLET | Freq: Every day | ORAL | 1 refills | Status: DC

## 2023-03-15 MED ORDER — LEVOTHYROXINE SODIUM 50 MCG PO TABS: 50.0000 ug | ORAL_TABLET | Freq: Every day | ORAL | 1 refills | Status: DC

## 2023-03-15 MED ORDER — METFORMIN HCL 500 MG PO TABS: ORAL_TABLET | ORAL | 1 refills | Status: DC

## 2023-03-15 MED ORDER — ESCITALOPRAM OXALATE 20 MG PO TABS: 20.0000 mg | ORAL_TABLET | Freq: Every day | ORAL | 1 refills | Status: DC

## 2023-03-15 MED ORDER — MELOXICAM 15 MG PO TABS: 15.0000 mg | ORAL_TABLET | Freq: Every day | ORAL | 1 refills | Status: DC | PRN

## 2023-03-15 MED ORDER — OMEPRAZOLE 40 MG PO CPDR: 40.0000 mg | DELAYED_RELEASE_CAPSULE | Freq: Every day | ORAL | 2 refills | Status: DC

## 2023-03-15 MED ORDER — TAMSULOSIN HCL 0.4 MG PO CAPS: 0.8000 mg | ORAL_CAPSULE | Freq: Every day | ORAL | 5 refills | Status: DC

## 2023-03-15 MED ORDER — LISINOPRIL-HYDROCHLOROTHIAZIDE 10-12.5 MG PO TABS: 1.0000 | ORAL_TABLET | Freq: Every day | ORAL | 1 refills | Status: DC

## 2023-03-15 NOTE — Telephone Encounter (Signed)
Pt requested refills sent to new pharmacy. [facility]. I have sent in a few, and pended the rest for approval.

## 2023-03-28 NOTE — Telephone Encounter (Signed)
Late entry, I called this patient & discussed refilled requested & sent for approval.

## 2023-06-13 ENCOUNTER — Encounter: Payer: Self-pay | Admitting: Family Medicine

## 2023-06-13 ENCOUNTER — Telehealth: Payer: Self-pay | Admitting: *Deleted

## 2023-06-13 ENCOUNTER — Ambulatory Visit (INDEPENDENT_AMBULATORY_CARE_PROVIDER_SITE_OTHER): Payer: Medicare Other | Admitting: Family Medicine

## 2023-06-13 ENCOUNTER — Other Ambulatory Visit: Payer: Self-pay | Admitting: Family Medicine

## 2023-06-13 VITALS — BP 120/74 | HR 81 | Temp 98.5°F | Ht 71.0 in | Wt 211.8 lb

## 2023-06-13 DIAGNOSIS — E1169 Type 2 diabetes mellitus with other specified complication: Secondary | ICD-10-CM

## 2023-06-13 DIAGNOSIS — E559 Vitamin D deficiency, unspecified: Secondary | ICD-10-CM

## 2023-06-13 DIAGNOSIS — E039 Hypothyroidism, unspecified: Secondary | ICD-10-CM | POA: Diagnosis not present

## 2023-06-13 DIAGNOSIS — Z9181 History of falling: Secondary | ICD-10-CM

## 2023-06-13 DIAGNOSIS — R42 Dizziness and giddiness: Secondary | ICD-10-CM | POA: Diagnosis not present

## 2023-06-13 DIAGNOSIS — W19XXXD Unspecified fall, subsequent encounter: Secondary | ICD-10-CM

## 2023-06-13 DIAGNOSIS — F331 Major depressive disorder, recurrent, moderate: Secondary | ICD-10-CM

## 2023-06-13 DIAGNOSIS — I10 Essential (primary) hypertension: Secondary | ICD-10-CM

## 2023-06-13 DIAGNOSIS — R5383 Other fatigue: Secondary | ICD-10-CM | POA: Diagnosis not present

## 2023-06-13 DIAGNOSIS — E669 Obesity, unspecified: Secondary | ICD-10-CM | POA: Diagnosis not present

## 2023-06-13 LAB — CBC
HCT: 44.5 % (ref 39.0–52.0)
Hemoglobin: 14.8 g/dL (ref 13.0–17.0)
MCHC: 33.3 g/dL (ref 30.0–36.0)
MCV: 90.3 fl (ref 78.0–100.0)
Platelets: 198 10*3/uL (ref 150.0–400.0)
RBC: 4.93 Mil/uL (ref 4.22–5.81)
RDW: 13 % (ref 11.5–15.5)
WBC: 5.5 10*3/uL (ref 4.0–10.5)

## 2023-06-13 LAB — VITAMIN B12: Vitamin B-12: 521 pg/mL (ref 211–911)

## 2023-06-13 LAB — BASIC METABOLIC PANEL
BUN: 13 mg/dL (ref 6–23)
CO2: 26 mEq/L (ref 19–32)
Calcium: 9.7 mg/dL (ref 8.4–10.5)
Chloride: 91 mEq/L — ABNORMAL LOW (ref 96–112)
Creatinine, Ser: 0.84 mg/dL (ref 0.40–1.50)
GFR: 83.35 mL/min (ref >60.00)
Glucose, Bld: 627 mg/dL (ref 70–99)
Potassium: 4.4 mEq/L (ref 3.5–5.1)
Sodium: 128 mEq/L — ABNORMAL LOW (ref 135–145)

## 2023-06-13 LAB — TSH: TSH: 1.06 u[IU]/mL (ref 0.35–5.50)

## 2023-06-13 LAB — HEMOGLOBIN A1C: Hgb A1c MFr Bld: 15.4 % — ABNORMAL HIGH (ref 4.6–6.5)

## 2023-06-13 LAB — VITAMIN D 25 HYDROXY (VIT D DEFICIENCY, FRACTURES): VITD: 22.03 ng/mL — ABNORMAL LOW (ref 30.00–100.00)

## 2023-06-13 NOTE — Telephone Encounter (Signed)
Spoke to the Patient and he looked around and can not find his glucometer. Patient states wife would be home soon and she will check it. He wrote down what I said about going to the ED and the reading and he will let his wife know so she will check it. Patient did say he will go to the ED if his glucose is 450 or higher when his wife checks it.

## 2023-06-13 NOTE — Assessment & Plan Note (Signed)
Adequately controlled today.  He will continue lisinopril-HCTZ 10-12.5 mg daily.

## 2023-06-13 NOTE — Assessment & Plan Note (Signed)
Chronic issue.  Worsened somewhat.  Discussed switching Lexapro to something different though he is hesitant.  He will continue Lexapro 20 mg daily and Wellbutrin 300 mg daily.

## 2023-06-13 NOTE — Telephone Encounter (Signed)
Please call the patient. His glucose was 627 on labs today. Please see if he has checked his glucose since he got home. If he has not then he should try to check it while on the phone with you. If his glucose is still above 450 he needs to go to the ED for evaluation and treatment.

## 2023-06-13 NOTE — Assessment & Plan Note (Signed)
Chronic issue.  Check A1c.  Continue metformin 1000 mg twice daily and Ozempic 0.5 mg weekly. 

## 2023-06-13 NOTE — Assessment & Plan Note (Signed)
Patient does report fatigue and tiredness.  Discussed this could be related to his depression.  He also reports history of hypogonadism and I discussed it could be related to that.  Patient declined evaluation through endocrinology previously and notes he would not want to go for that now.  He wondered if there is anything I could prescribe for the hypogonadism and I noted that treatment of that issue is higher risk and thus I do not prescribe medications for that and defer the management of that issue to specialists.  We will check vitamin D and B12 levels as well as his TSH to see if there is anything else that could be contributing to this.

## 2023-06-13 NOTE — Assessment & Plan Note (Signed)
Discussed using a nightlight to help guide his way when he gets up in the middle of the night.  Orthostatics completed today to evaluate for his lightheadedness.  Encouraged adequate hydration.  Encouraged to stay as active as he can.

## 2023-06-13 NOTE — Progress Notes (Signed)
[name], MD Phone: [phone]  Timothy Edwards is a 77 y.o. male who presents today for f/u.  HYPERTENSION Disease Monitoring Home BP Monitoring not checking Chest pain- no    Dyspnea- no Medications Compliance-  taking lisinopril/HCTZ. Lightheadedness- yes  Edema- no BMET    Component Value Date/Time   NA 140 12/13/2022 1226   NA 137 04/16/2010 0000   K 4.3 12/13/2022 1226   CL 102 12/13/2022 1226   CO2 28 12/13/2022 1226   GLUCOSE 129 (H) 12/13/2022 1226   BUN 12 12/13/2022 1226   BUN 14 04/16/2010 0000   CREATININE 0.86 12/13/2022 1226   CALCIUM 9.5 12/13/2022 1226   GFRNONAA >60 07/15/2018 0504   GFRAA >60 07/15/2018 0504   DIABETES Disease Monitoring: Blood Sugar ranges-not checking Polyuria/phagia/dipsia- notes he drinks a lot of fluids and urinates a lot as a result Medications: Compliance- taking metformin, ozempic Hypoglycemic symptoms- no  Depression: Patient reports this is worse than usual.  He feels like he could have done better throughout his life and he thinks about that a lot.  He continues on Lexapro and Wellbutrin.  He notes no SI on questioning during the encounter.  He is hesitant to change his medicines as they have worked for him adequately over time.  Falls: Patient notes he had a couple falls when he gets up to go to the bathroom at night in the dark.  He also notes he had a fall down the steps at 1 point because he got swimmy headed or potentially missed a step.   Social History   Tobacco Use  Smoking Status Never  Smokeless Tobacco Never    Current Outpatient Medications on File Prior to Visit  Medication Sig Dispense Refill   atorvastatin (LIPITOR) 40 MG tablet Take 1 tablet (40 mg total) by mouth daily. 90 tablet 1   Blood Glucose Monitoring Suppl (ONE TOUCH BASIC SYSTEM) W/DEVICE KIT To use to check blood sugars once daily 1 each 0   buPROPion (WELLBUTRIN XL) 300 MG 24 hr tablet Take 1 tablet (300 mg total) by mouth daily.  90 tablet 1   cholecalciferol (VITAMIN D) 1000 UNITS tablet Take 1,000 Units by mouth daily.     escitalopram (LEXAPRO) 20 MG tablet Take 1 tablet (20 mg total) by mouth daily. 90 tablet 1   glucose blood test strip To use to check blood sugar once daily 100 each 12   levothyroxine (SYNTHROID) 50 MCG tablet Take 1 tablet (50 mcg total) by mouth daily. 90 tablet 1   lisinopril-hydrochlorothiazide (ZESTORETIC) 10-12.5 MG tablet Take 1 tablet by mouth daily. 90 tablet 1   meloxicam (MOBIC) 15 MG tablet Take 1 tablet (15 mg total) by mouth daily as needed. 30 tablet 1   metFORMIN (GLUCOPHAGE) 500 MG tablet TAKE 2 TABLETS BY MOUTH EVERY MORNING AND 2 TABLETS EVERY EVENING 360 tablet 1   omeprazole (PRILOSEC) 40 MG capsule Take 1 capsule (40 mg total) by mouth daily. 180 capsule 2   ONE TOUCH LANCETS MISC To use to check blood sugar once daily 100 each 3   Semaglutide,0.25 or 0.5MG /DOS, (OZEMPIC, 0.25 OR 0.5 MG/DOSE,) 2 MG/3ML SOPN Inject 0.5 mg into the skin once a week. 9 mL 1   tamsulosin (FLOMAX) 0.4 MG CAPS capsule Take 2 capsules (0.8 mg total) by mouth daily. 60 capsule 5   Vitamin D, Ergocalciferol, (DRISDOL) 1.25 MG (50000 UNIT) CAPS capsule Take 1 capsule (50,000 Units total) by mouth every 7 (seven) days. 12  capsule 0   No current facility-administered medications on file prior to visit.     ROS see history of present illness  Objective  Physical Exam Vitals:   06/13/23 1122  BP: 120/74  Pulse: 81  Temp: 98.5 F (36.9 C)  SpO2: 95%   Laying blood pressure 137/77 pulse 82 Sitting blood pressure 134/75 pulse 80 Standing blood pressure 136/72 pulse 85  BP Readings from Last 3 Encounters:  06/13/23 120/74  12/13/22 (!) 140/70  08/18/22 120/80   Wt Readings from Last 3 Encounters:  06/13/23 211 lb 12.8 oz (96.1 kg)  12/13/22 235 lb (106.6 kg)  08/18/22 238 lb 12.8 oz (108.3 kg)    Physical Exam Constitutional:      General: He is not in acute distress.    Appearance:  He is not diaphoretic.  Cardiovascular:     Rate and Rhythm: Normal rate and regular rhythm.     Heart sounds: Normal heart sounds.  Pulmonary:     Effort: Pulmonary effort is normal.     Breath sounds: Normal breath sounds.  Musculoskeletal:     Right lower leg: No edema.     Left lower leg: No edema.  Skin:    General: Skin is warm and dry.  Neurological:     Mental Status: He is alert.      Assessment/Plan: Please see individual problem list.  Primary hypertension Assessment & Plan: Adequately controlled today.  He will continue lisinopril-HCTZ 10-12.5 mg daily.  Orders: -     Basic metabolic panel  Type 2 diabetes mellitus with obesity (HCC) Assessment & Plan: Chronic issue.  Check A1c.  Continue metformin 1000 mg twice daily and Ozempic 0.5 mg weekly.  Orders: -     Hemoglobin A1c  Moderate episode of recurrent major depressive disorder (HCC) Assessment & Plan: Chronic issue.  Worsened somewhat.  Discussed switching Lexapro to something different though he is hesitant.  He will continue Lexapro 20 mg daily and Wellbutrin 300 mg daily.   Fall, subsequent encounter Assessment & Plan: Discussed using a nightlight to help guide his way when he gets up in the middle of the night.  Orthostatics completed today to evaluate for his lightheadedness.  Encouraged adequate hydration.  Encouraged to stay as active as he can.   Light headedness -     CBC  Vitamin D deficiency -     VITAMIN D 25 Hydroxy (Vit-D Deficiency, Fractures)  Other fatigue Assessment & Plan: Patient does report fatigue and tiredness.  Discussed this could be related to his depression.  He also reports history of hypogonadism and I discussed it could be related to that.  Patient declined evaluation through endocrinology previously and notes he would not want to go for that now.  He wondered if there is anything I could prescribe for the hypogonadism and I noted that treatment of that issue is higher  risk and thus I do not prescribe medications for that and defer the management of that issue to specialists.  We will check vitamin D and B12 levels as well as his TSH to see if there is anything else that could be contributing to this.  Orders: -     VITAMIN D 25 Hydroxy (Vit-D Deficiency, Fractures) -     TSH -     Vitamin B12  Hypothyroidism, unspecified type -     TSH    Return in about 6 months (around 12/14/2023).   [name], MD [facility]

## 2023-06-13 NOTE — Telephone Encounter (Signed)
Noted  

## 2023-06-13 NOTE — Telephone Encounter (Signed)
CRITICAL VALUE STICKER  CRITICAL VALUE: Glucose-627  RECEIVER (on-site recipient of call): Silvestre Moment, CMA  DATE & TIME NOTIFIED: 4:00 on 06/13/23  MESSENGER (representative from lab): Saa  MD NOTIFIED: Dr. Birdie Sons  TIME OF NOTIFICATION: 4:15pm  RESPONSE:

## 2023-06-14 ENCOUNTER — Telehealth: Payer: Self-pay

## 2023-06-14 ENCOUNTER — Other Ambulatory Visit: Payer: Self-pay

## 2023-06-14 MED ORDER — OZEMPIC (0.25 OR 0.5 MG/DOSE) 2 MG/3ML ~~LOC~~ SOPN: 0.5000 mg | PEN_INJECTOR | SUBCUTANEOUS | 1 refills | Status: DC

## 2023-06-14 NOTE — Telephone Encounter (Signed)
Spoke to Patient and he understands and is agreeable to the [employer]. [facility] [place].

## 2023-06-14 NOTE — Telephone Encounter (Signed)
Spoke to Patient and he understands and is agreeable to the [employer].

## 2023-06-14 NOTE — Telephone Encounter (Signed)
Left message to call back please transfer to [facility] at [phone] if before 12:00 if after 1:00 please call my desk or send to [facility] if I am not available

## 2023-06-14 NOTE — Telephone Encounter (Signed)
Pt returned [facility] CMA call. Unable to transfer. Pt stated he will be waiting for a call back.

## 2023-06-14 NOTE — Telephone Encounter (Signed)
Spoke to the Patient's daughter Santina Evans after I attempted to call the Patient and check on him from yesterday his lab glucose was 627. Santina Evans the daughter called me back after requesting Ozempic and Glipizide for the Patient. Santina Evans states her Dad's glucose is 540 today so I advised her and sent a my chart message to go to the Emergency Department to be evaluated and treated for the high glucose. I also let both of them know this is serious. I sent the Ozempic in but the Patient has not been on Glipizide since 2020. I also let them know that Dr. Birdie Sons is out of the office today.

## 2023-06-14 NOTE — Telephone Encounter (Signed)
LMTCB

## 2023-06-14 NOTE — Telephone Encounter (Signed)
Left message to call the office back regarding new lab results.

## 2023-06-14 NOTE — Telephone Encounter (Signed)
Noted. Has he been off of the ozempic? His A1c was quite elevated. I would recommend starting insulin (basaglar 10 u daily) at least in the short term so we can get his glucose down. If he has been taking the ozempic then we could increase the dose of that as well to 1 mg weekly. If he has not been taking the ozempic then we will need to restart at 0.25 mg weekly and ramp up from there. I can send in the basaglar once you speak with him. Thanks.

## 2023-06-14 NOTE — Telephone Encounter (Signed)
-----   Message from [name] sent at 06/14/2023  3:31 PM EDT ----- See phone message for diabetes management. His vitamin D is low as well. He needs vitamin D supplementation. Has he been taking any vitamin D over the counter?

## 2023-06-14 NOTE — Telephone Encounter (Signed)
Pt daughter Santina Evans called in asking if Dr. Birdie Sons can write her dad sugar meds xr which are Ozempic and [facility]?

## 2023-06-14 NOTE — Telephone Encounter (Signed)
Spoke to Patient again he stated now his glucose is 406 and that he is going to pick up the Ozempic and use it. Patient states he will call us in the morning with an update. Patient denies any symptoms and says he feels fine. I advised is the glucose is 450 or higher he needs to be treated at the ED. Patient politely declined to go to the ED unless he thinks he is dying.

## 2023-06-14 NOTE — Telephone Encounter (Signed)
Noted. See other phone note. 

## 2023-06-15 MED ORDER — BASAGLAR KWIKPEN 100 UNIT/ML ~~LOC~~ SOPN
10.0000 [IU] | PEN_INJECTOR | Freq: Every day | SUBCUTANEOUS | 1 refills | Status: DC
Start: 2023-06-15 — End: 2023-10-29

## 2023-06-15 NOTE — Telephone Encounter (Signed)
Referral placed.

## 2023-06-15 NOTE — Addendum Note (Signed)
Addended by: Birdie Sons, Quanesha Klimaszewski G on: 06/15/2023 01:30 PM   Modules accepted: Orders

## 2023-06-15 NOTE — Progress Notes (Addendum)
Basaglar pended for your review

## 2023-06-15 NOTE — Addendum Note (Signed)
Addended by: Glori Luis on: 06/15/2023 10:32 AM   Modules accepted: Orders

## 2023-06-15 NOTE — Telephone Encounter (Signed)
Spoke to Patient he is aware to pick up the Basaglar and start it. Patient states his glucose before he ate this morning was 410. Patient understands and is agreeable to have an office visit with you and is scheduled for 06/27/23 at 4:00 and a referral for our Pharmacist. Place referral for [facility] please.

## 2023-06-15 NOTE — Addendum Note (Signed)
Addended by: Prince Solian A on: 06/15/2023 10:30 AM   Modules accepted: Orders

## 2023-06-15 NOTE — Telephone Encounter (Signed)
Pt daughter called back in regards to lab work. The pt was there and gave me permission since the daughter is not on the [facility]. The pt daughter stated he takes the vitamin D sometimes

## 2023-06-15 NOTE — Telephone Encounter (Signed)
Basaglar sent to pharmacy. He needs follow-up with me in 1-2 weeks. I would also like to have him talk to our pharmacist about his diabetes to help with close follow-up on this issue. I can refer him to her if he is willing. Thanks.

## 2023-06-18 NOTE — Telephone Encounter (Signed)
Patient's wife called and said that [facility] [place] [facility] for  Insulin Glargine (BASAGLAR KWIKPEN) 100 UNIT/ML, Pleas resend.

## 2023-06-18 NOTE — Telephone Encounter (Signed)
Called Pharmacy and the Pharmacy states they have the prescription and are getting it ready for the Patient so I called and let the Patient know.

## 2023-06-19 ENCOUNTER — Telehealth: Payer: Self-pay

## 2023-06-19 NOTE — Progress Notes (Signed)
   Care Guide Note  06/19/2023 Name: Timothy Edwards MRN: [id number] DOB: 02-Aug-1946  Referred by: Glori Luis, MD Reason for referral : Care Coordination (Outreach to schedule with Pharm d )   Timothy Edwards is a 77 y.o. year old male who is a primary care patient of Birdie Sons, Yehuda Mao, MD. Timothy Edwards was referred to the pharmacist for assistance related to DM.    Successful contact was made with the patient to discuss pharmacy services including being ready for the pharmacist to call at least 5 minutes before the scheduled appointment time, to have medication bottles and any blood sugar or blood pressure readings ready for review. The patient agreed to meet with the pharmacist via with the pharmacist via telephone visit on (date/time).  07/24/2023  Penne Lash, RMA Care Guide [facility]  [place], [place] [address] Direct Dial: [phone] [email or web address]

## 2023-06-27 ENCOUNTER — Encounter: Payer: Self-pay | Admitting: Family Medicine

## 2023-06-27 ENCOUNTER — Ambulatory Visit (INDEPENDENT_AMBULATORY_CARE_PROVIDER_SITE_OTHER): Payer: Medicare Other | Admitting: Family Medicine

## 2023-06-27 VITALS — BP 120/70 | HR 86 | Temp 98.2°F | Ht 71.0 in | Wt 211.0 lb

## 2023-06-27 DIAGNOSIS — Z7984 Long term (current) use of oral hypoglycemic drugs: Secondary | ICD-10-CM

## 2023-06-27 DIAGNOSIS — Z7985 Long-term (current) use of injectable non-insulin antidiabetic drugs: Secondary | ICD-10-CM

## 2023-06-27 DIAGNOSIS — R413 Other amnesia: Secondary | ICD-10-CM | POA: Diagnosis not present

## 2023-06-27 DIAGNOSIS — E1169 Type 2 diabetes mellitus with other specified complication: Secondary | ICD-10-CM

## 2023-06-27 DIAGNOSIS — E669 Obesity, unspecified: Secondary | ICD-10-CM | POA: Diagnosis not present

## 2023-06-27 NOTE — Assessment & Plan Note (Signed)
Much improved.  Suspect elevated A1c and sugars previously were related to being off of medication.  Patient will continue Ozempic 0.25 mg for total of 4 weeks and then increase to 0.5 mg.  He will continue metformin 1000 mg twice daily.  He will not start on the Basaglar at this time as it is not needed.

## 2023-06-27 NOTE — Patient Instructions (Signed)
Nice to see you. Please take the ozempic 0.25 mg dose weekly for 4 weeks. After that you can increase the ozempic to 0.5 mg weekly. You do not need to be on the basaglar at this time.

## 2023-06-27 NOTE — Assessment & Plan Note (Signed)
Chronic memory difficulty likely related to his depression.  B12 and TSH were acceptable.  Discussed using lists and writing things down to help him remember.  Patient has been hesitant to alter his depression regimen given that it has generally kept him stable.

## 2023-06-27 NOTE — Progress Notes (Signed)
[name], MD Phone: [phone]  Timothy Edwards is a 77 y.o. male who presents today for follow-up.  Diabetes: Patient notes his sugars have been progressively trending down.  He notes this morning is 176.  The highest it has been recently has been in the low 200s.  He is on metformin and Ozempic.  He notes he had been off of his medication for a while leading into our visit.  He notes no polyuria or polydipsia.  No hypoglycemia.  Currently he is taking Ozempic 0.25 mg weekly.  He has reduced his carbohydrate intake.  Memory difficulty: Patient reports chronic memory issues.  Notes he has trouble with short-term memory.  We recently checked TSH and B12 which were acceptable.  At his last visit we did discuss that his memory could be off because of his depression though he opted to remain on his current depression medications given that he is relatively stable with that.  Social History   Tobacco Use  Smoking Status Never  Smokeless Tobacco Never    Current Outpatient Medications on File Prior to Visit  Medication Sig Dispense Refill   atorvastatin (LIPITOR) 40 MG tablet Take 1 tablet (40 mg total) by mouth daily. 90 tablet 1   Blood Glucose Monitoring Suppl (ONE TOUCH BASIC SYSTEM) W/DEVICE KIT To use to check blood sugars once daily 1 each 0   buPROPion (WELLBUTRIN XL) 300 MG 24 hr tablet Take 1 tablet (300 mg total) by mouth daily. 90 tablet 1   cholecalciferol (VITAMIN D) 1000 UNITS tablet Take 1,000 Units by mouth daily.     escitalopram (LEXAPRO) 20 MG tablet Take 1 tablet (20 mg total) by mouth daily. 90 tablet 1   glucose blood test strip To use to check blood sugar once daily 100 each 12   Insulin Glargine (BASAGLAR KWIKPEN) 100 UNIT/ML Inject 10 Units into the skin daily. 15 mL 1   levothyroxine (SYNTHROID) 50 MCG tablet Take 1 tablet (50 mcg total) by mouth daily. 90 tablet 1   lisinopril-hydrochlorothiazide (ZESTORETIC) 10-12.5 MG tablet Take 1 tablet by mouth  daily. 90 tablet 1   meloxicam (MOBIC) 15 MG tablet Take 1 tablet (15 mg total) by mouth daily as needed. 30 tablet 1   metFORMIN (GLUCOPHAGE) 500 MG tablet TAKE 2 TABLETS BY MOUTH EVERY MORNING AND 2 TABLETS EVERY EVENING 360 tablet 1   omeprazole (PRILOSEC) 40 MG capsule Take 1 capsule (40 mg total) by mouth daily. 180 capsule 2   ONE TOUCH LANCETS MISC To use to check blood sugar once daily 100 each 3   Semaglutide,0.25 or 0.5MG /DOS, (OZEMPIC, 0.25 OR 0.5 MG/DOSE,) 2 MG/3ML SOPN Inject 0.5 mg into the skin once a week. 9 mL 1   tamsulosin (FLOMAX) 0.4 MG CAPS capsule Take 2 capsules (0.8 mg total) by mouth daily. 60 capsule 5   Vitamin D, Ergocalciferol, (DRISDOL) 1.25 MG (50000 UNIT) CAPS capsule Take 1 capsule (50,000 Units total) by mouth every 7 (seven) days. 12 capsule 0   No current facility-administered medications on file prior to visit.     ROS see history of present illness  Objective  Physical Exam Vitals:   06/27/23 1549  BP: 120/70  Pulse: 86  Temp: 98.2 F (36.8 C)  SpO2: 96%    BP Readings from Last 3 Encounters:  06/27/23 120/70  06/13/23 120/74  12/13/22 (!) 140/70   Wt Readings from Last 3 Encounters:  06/27/23 211 lb (95.7 kg)  06/13/23 211 lb 12.8 oz (96.1  kg)  12/13/22 235 lb (106.6 kg)    Physical Exam Constitutional:      General: He is not in acute distress.    Appearance: He is not diaphoretic.  Cardiovascular:     Rate and Rhythm: Normal rate and regular rhythm.     Heart sounds: Normal heart sounds.  Pulmonary:     Effort: Pulmonary effort is normal.     Breath sounds: Normal breath sounds.  Skin:    General: Skin is warm and dry.  Neurological:     Mental Status: He is alert.      Assessment/Plan: Please see individual problem list.  Type 2 diabetes mellitus with obesity (HCC) Assessment & Plan: Much improved.  Suspect elevated A1c and sugars previously were related to being off of medication.  Patient will continue Ozempic  0.25 mg for total of 4 weeks and then increase to 0.5 mg.  He will continue metformin 1000 mg twice daily.  He will not start on the Basaglar at this time as it is not needed.   Memory loss Assessment & Plan: Chronic memory difficulty likely related to his depression.  B12 and TSH were acceptable.  Discussed using lists and writing things down to help him remember.  Patient has been hesitant to alter his depression regimen given that it has generally kept him stable.     Return in about 3 months (around 09/27/2023) for DM.   [name], MD [facility]

## 2023-07-23 ENCOUNTER — Telehealth: Payer: Self-pay | Admitting: Family Medicine

## 2023-07-23 ENCOUNTER — Other Ambulatory Visit: Payer: Self-pay | Admitting: Family Medicine

## 2023-07-23 DIAGNOSIS — E669 Obesity, unspecified: Secondary | ICD-10-CM

## 2023-07-23 NOTE — Telephone Encounter (Signed)
Copied from CRM [id number]. Topic: Medicare AWV >> Jul 23, 2023  1:48 PM Payton Doughty wrote: Reason for CRM: LM 07/23/2023 to schedule AWV   Verlee Rossetti; Care Guide Ambulatory Clinical Support [facility] Direct Dial: [phone]

## 2023-07-24 ENCOUNTER — Telehealth: Payer: Self-pay | Admitting: Pharmacist

## 2023-07-24 ENCOUNTER — Other Ambulatory Visit: Payer: Medicare Other | Admitting: Pharmacist

## 2023-07-24 NOTE — Progress Notes (Unsigned)
Attempted to contact patient for scheduled appointment for medication management. Left HIPAA compliant message for patient to return my call at their convenience.   Catie T. Harper, PharmD, BCACP, CPP Clinical Pharmacist [facility] [phone]  

## 2023-07-30 ENCOUNTER — Telehealth: Payer: Self-pay

## 2023-07-30 NOTE — Progress Notes (Signed)
   Care Guide Note  07/30/2023 Name: Timothy Edwards MRN: [id number] DOB: 26-Dec-1945  Referred by: Glori Luis, MD Reason for referral : Care Coordination (Outreach to reschedule with Pharm d )   Timothy Edwards is a 77 y.o. year old male who is a primary care patient of Birdie Sons, Yehuda Mao, MD. Zohar Jore was referred to the pharmacist for assistance related to DM.    An unsuccessful telephone outreach was attempted today to contact the patient who was referred to the pharmacy team for assistance with medication management. Additional attempts will be made to contact the patient.   Penne Lash, RMA Care Guide [facility]  [place], [place] [address] Direct Dial: [phone] [email or web address]

## 2023-08-07 NOTE — Progress Notes (Signed)
   Care Guide Note  08/07/2023 Name: [name] MRN: [id number] DOB: 1946/03/10  Referred by: Glori Luis, MD Reason for referral : Care Coordination (Outreach to reschedule with Pharm d )   Timothy Edwards is a 77 y.o. year old male who is a primary care patient of Birdie Sons, Yehuda Mao, MD. Timothy Edwards was referred to the pharmacist for assistance related to DM.    Successful contact was made with the patient to discuss pharmacy services. Patient declines engagement at this time. Contact information was provided to the patient should they wish to reach out for assistance at a later time.  Penne Lash, RMA Care Guide [facility]  [place], [place] [address] Direct Dial: [phone] [email or web address]

## 2023-08-30 ENCOUNTER — Other Ambulatory Visit: Payer: Self-pay | Admitting: Family Medicine

## 2023-08-30 DIAGNOSIS — M1990 Unspecified osteoarthritis, unspecified site: Secondary | ICD-10-CM

## 2023-09-26 ENCOUNTER — Ambulatory Visit: Payer: Medicare Other | Admitting: Family Medicine

## 2023-10-05 ENCOUNTER — Ambulatory Visit: Payer: Medicare Other | Admitting: Family Medicine

## 2023-10-19 ENCOUNTER — Encounter: Payer: Self-pay | Admitting: Family Medicine

## 2023-10-24 ENCOUNTER — Ambulatory Visit: Payer: Medicare Other | Admitting: Family Medicine

## 2023-10-28 ENCOUNTER — Other Ambulatory Visit: Payer: Self-pay | Admitting: Family Medicine

## 2023-10-28 DIAGNOSIS — M1990 Unspecified osteoarthritis, unspecified site: Secondary | ICD-10-CM

## 2023-10-29 ENCOUNTER — Ambulatory Visit (INDEPENDENT_AMBULATORY_CARE_PROVIDER_SITE_OTHER): Payer: Medicare Other | Admitting: Family Medicine

## 2023-10-29 ENCOUNTER — Telehealth: Payer: Self-pay

## 2023-10-29 ENCOUNTER — Encounter: Payer: Self-pay | Admitting: Family Medicine

## 2023-10-29 VITALS — BP 130/80 | HR 72 | Temp 98.0°F | Resp 17 | Ht 71.0 in | Wt 187.5 lb

## 2023-10-29 DIAGNOSIS — E559 Vitamin D deficiency, unspecified: Secondary | ICD-10-CM

## 2023-10-29 DIAGNOSIS — Z7985 Long-term (current) use of injectable non-insulin antidiabetic drugs: Secondary | ICD-10-CM | POA: Diagnosis not present

## 2023-10-29 DIAGNOSIS — Z7984 Long term (current) use of oral hypoglycemic drugs: Secondary | ICD-10-CM

## 2023-10-29 DIAGNOSIS — F331 Major depressive disorder, recurrent, moderate: Secondary | ICD-10-CM | POA: Diagnosis not present

## 2023-10-29 DIAGNOSIS — E1169 Type 2 diabetes mellitus with other specified complication: Secondary | ICD-10-CM | POA: Diagnosis not present

## 2023-10-29 DIAGNOSIS — I10 Essential (primary) hypertension: Secondary | ICD-10-CM | POA: Diagnosis not present

## 2023-10-29 DIAGNOSIS — E669 Obesity, unspecified: Secondary | ICD-10-CM

## 2023-10-29 DIAGNOSIS — M1990 Unspecified osteoarthritis, unspecified site: Secondary | ICD-10-CM | POA: Diagnosis not present

## 2023-10-29 LAB — HEMOGLOBIN A1C: Hgb A1c MFr Bld: 5.5 % (ref 4.6–6.5)

## 2023-10-29 LAB — COMPREHENSIVE METABOLIC PANEL
ALT: 12 U/L (ref 0–53)
AST: 18 U/L (ref 0–37)
Albumin: 4.4 g/dL (ref 3.5–5.2)
Alkaline Phosphatase: 63 U/L (ref 39–117)
BUN: 15 mg/dL (ref 6–23)
CO2: 27 meq/L (ref 19–32)
Calcium: 9.9 mg/dL (ref 8.4–10.5)
Chloride: 101 meq/L (ref 96–112)
Creatinine, Ser: 0.81 mg/dL (ref 0.40–1.50)
GFR: 84.05 mL/min (ref >60.00)
Glucose, Bld: 95 mg/dL (ref 70–99)
Potassium: 4.3 meq/L (ref 3.5–5.1)
Sodium: 138 meq/L (ref 135–145)
Total Bilirubin: 0.8 mg/dL (ref 0.2–1.2)
Total Protein: 6.5 g/dL (ref 6.0–8.3)

## 2023-10-29 LAB — VITAMIN D 25 HYDROXY (VIT D DEFICIENCY, FRACTURES): VITD: 22.93 ng/mL — ABNORMAL LOW (ref 30.00–100.00)

## 2023-10-29 MED ORDER — MELOXICAM 15 MG PO TABS: 15.0000 mg | ORAL_TABLET | Freq: Every day | ORAL | 1 refills | Status: DC | PRN

## 2023-10-29 MED ORDER — LISINOPRIL 10 MG PO TABS: 10.0000 mg | ORAL_TABLET | Freq: Every day | ORAL | 1 refills | Status: DC

## 2023-10-29 NOTE — Assessment & Plan Note (Signed)
Chronic issue.  Adequately controlled for age.  He has been getting lightheaded.  We will discontinue the HCTZ portion of his blood pressure medication.  He will continue lisinopril 10 mg daily.  He will follow-up with me in 1 month.

## 2023-10-29 NOTE — Telephone Encounter (Signed)
Patient called back and note was read. He has not been taking Vit D, but will start.

## 2023-10-29 NOTE — Telephone Encounter (Signed)
-----   Message from [name] sent at 10/29/2023  3:40 PM EST ----- Please let the patient know his vitamin D is low.  Has he been taking a vitamin D supplement?  If he is not he should start on vitamin D 1000 international units once daily.  His A1c is 5.5.  He should continue with the Ozempic.  His other labs are acceptable.

## 2023-10-29 NOTE — Progress Notes (Signed)
[name], MD Phone: [phone]  Timothy Edwards is a 77 y.o. male who presents today for follow-up.  Diabetes: Typically 90-101.  He is on Ozempic 0.5 mg weekly.  Also on metformin.  Some polyuria though he does note he drinks a lot of liquids.  No hypoglycemia.  He is due to see ophthalmology.  Hypertension: Patient's daughter notes she thinks he has been getting orthostatic at home.  He has been getting lightheaded when he gets up at times.  No chest pain, shortness of breath, or edema.  He is on lisinopril and HCTZ.  He has lost quite a bit of weight since our last visit.  He has been back on Ozempic and that has contributed to reduced appetite.  Anxiety/depression: Patient continues to have issues with this.  He notes its about as well-controlled as it has been since he was a young adult.  He is on Wellbutrin and Lexapro.  He notes thoughts of being better off dead though no active suicidal ideation.  Social History   Tobacco Use  Smoking Status Never  Smokeless Tobacco Never    Current Outpatient Medications on File Prior to Visit  Medication Sig Dispense Refill   atorvastatin (LIPITOR) 40 MG tablet TAKE 1 TABLET BY MOUTH EVERY DAY 90 tablet 1   Blood Glucose Monitoring Suppl (ONE TOUCH BASIC SYSTEM) W/DEVICE KIT To use to check blood sugars once daily 1 each 0   buPROPion (WELLBUTRIN XL) 300 MG 24 hr tablet TAKE 1 TABLET BY MOUTH EVERY DAY 90 tablet 1   cholecalciferol (VITAMIN D) 1000 UNITS tablet Take 1,000 Units by mouth daily.     escitalopram (LEXAPRO) 20 MG tablet TAKE 1 TABLET BY MOUTH EVERY DAY 90 tablet 1   glucose blood test strip To use to check blood sugar once daily 100 each 12   levothyroxine (SYNTHROID) 50 MCG tablet TAKE 1 TABLET BY MOUTH EVERY DAY 90 tablet 1   metFORMIN (GLUCOPHAGE) 500 MG tablet TAKE 2 TABLETS BY MOUTH EVERY MORNING AND 2 TABLETS EVERY EVENING 360 tablet 1   omeprazole (PRILOSEC) 40 MG capsule Take 1 capsule (40 mg total) by mouth  daily. 180 capsule 2   ONE TOUCH LANCETS MISC To use to check blood sugar once daily 100 each 3   tamsulosin (FLOMAX) 0.4 MG CAPS capsule Take 2 capsules (0.8 mg total) by mouth daily. 60 capsule 5   Semaglutide,0.25 or 0.5MG /DOS, (OZEMPIC, 0.25 OR 0.5 MG/DOSE,) 2 MG/3ML SOPN INJECT 0.5 MG INTO THE SKIN ONE TIME PER WEEK 9 mL 1   No current facility-administered medications on file prior to visit.     ROS see history of present illness  Objective  Physical Exam Vitals:   10/29/23 0910  BP: 130/80  Pulse: 72  Resp: 17  Temp: 98 F (36.7 C)  SpO2: 96%   Lying blood pressure 136/83 pulse 67 Sitting blood pressure 133/78 pulse 71 Standing blood pressure 124/78 pulse 75  BP Readings from Last 3 Encounters:  10/29/23 130/80  06/27/23 120/70  06/13/23 120/74   Wt Readings from Last 3 Encounters:  10/29/23 187 lb 8 oz (85 kg)  06/27/23 211 lb (95.7 kg)  06/13/23 211 lb 12.8 oz (96.1 kg)    Physical Exam Constitutional:      General: He is not in acute distress.    Appearance: He is not diaphoretic.  Cardiovascular:     Rate and Rhythm: Normal rate and regular rhythm.     Heart sounds: Normal heart  sounds.  Pulmonary:     Effort: Pulmonary effort is normal.     Breath sounds: Normal breath sounds.  Skin:    General: Skin is warm and dry.  Neurological:     Mental Status: He is alert.      Assessment/Plan: Please see individual problem list.  Primary hypertension Assessment & Plan: Chronic issue.  Adequately controlled for age.  He has been getting lightheaded.  We will discontinue the HCTZ portion of his blood pressure medication.  He will continue lisinopril 10 mg daily.  He will follow-up with me in 1 month.  Orders: -     Comprehensive metabolic panel  Type 2 diabetes mellitus with obesity (HCC) Assessment & Plan: Chronic issue.  Patient will continue Ozempic 0.5 mg weekly and metformin 1000 mg twice daily.  Check A1c.  Orders: -     Hemoglobin  A1c  Arthritis -     Meloxicam; Take 1 tablet (15 mg total) by mouth daily as needed.  Dispense: 30 tablet; Refill: 1  Vitamin D deficiency -     VITAMIN D 25 Hydroxy (Vit-D Deficiency, Fractures)  Moderate episode of recurrent major depressive disorder (HCC) Assessment & Plan: Patient with chronic anxiety and depression.  Reports that she generally stable.  Patient's symptoms are about as well-controlled as they have ever been.  I did gust the options of seeing a psychiatrist or therapist though he declines.  Discussed switching Lexapro or Wellbutrin to something different though given that he is doing as well as he ever has we will leave him on Lexapro 20 mg daily and Wellbutrin 300 mg daily.   Other orders -     Lisinopril; Take 1 tablet (10 mg total) by mouth daily.  Dispense: 90 tablet; Refill: 1     Return in about 1 month (around 11/28/2023) for Blood pressure follow-up with PCP, 3 months with new provider for transfer of care.   [name], MD [facility]

## 2023-10-29 NOTE — Patient Instructions (Addendum)
Nice to see you. I sent in a new blood pressure medication for you to take.  You will only be on lisinopril 10 mg daily.  Please do not take the lisinopril-HCTZ that you have at home.

## 2023-10-29 NOTE — Assessment & Plan Note (Signed)
Patient with chronic anxiety and depression.  Reports that she generally stable.  Patient's symptoms are about as well-controlled as they have ever been.  I did gust the options of seeing a psychiatrist or therapist though he declines.  Discussed switching Lexapro or Wellbutrin to something different though given that he is doing as well as he ever has we will leave him on Lexapro 20 mg daily and Wellbutrin 300 mg daily.

## 2023-10-29 NOTE — Assessment & Plan Note (Signed)
Chronic issue.  Patient will continue Ozempic 0.5 mg weekly and metformin 1000 mg twice daily.  Check A1c.

## 2023-10-29 NOTE — Telephone Encounter (Signed)
Left message to call the office back regarding lab results below.

## 2023-10-30 NOTE — Telephone Encounter (Signed)
Noted. We will plan on rechecking his vitamin D at his visit in January.

## 2023-12-14 ENCOUNTER — Ambulatory Visit (INDEPENDENT_AMBULATORY_CARE_PROVIDER_SITE_OTHER): Payer: Medicare Other | Admitting: Family Medicine

## 2023-12-14 VITALS — BP 124/70 | HR 66 | Temp 97.6°F | Ht 71.0 in | Wt 195.6 lb

## 2023-12-14 DIAGNOSIS — M7051 Other bursitis of knee, right knee: Secondary | ICD-10-CM | POA: Insufficient documentation

## 2023-12-14 DIAGNOSIS — I10 Essential (primary) hypertension: Secondary | ICD-10-CM

## 2023-12-14 DIAGNOSIS — E1169 Type 2 diabetes mellitus with other specified complication: Secondary | ICD-10-CM

## 2023-12-14 DIAGNOSIS — S0990XA Unspecified injury of head, initial encounter: Secondary | ICD-10-CM | POA: Insufficient documentation

## 2023-12-14 DIAGNOSIS — F331 Major depressive disorder, recurrent, moderate: Secondary | ICD-10-CM

## 2023-12-14 NOTE — Assessment & Plan Note (Addendum)
 Patient with very minor head injury after falling out of bed.  It does not sound as though he had a syncopal episode.  He is neurologically intact and is generally asymptomatic other than minimal soreness at the area where his head hit the floor.  Advised monitoring for any neurological symptoms or headaches and if those occur he needs to go to the emergency room.  Offered referral for physical therapy for falls prevention.  Patient declined at this time.

## 2023-12-14 NOTE — Progress Notes (Signed)
 Timothy Her, MD Phone: [phone]  Timothy Edwards is a 78 y.o. male who presents today for f/u.  Hypertension: Patient is on lisinopril .  We discontinued hydrochlorothiazide  at his last visit to see if that would help with his lightheadedness.  He notes no chest pain, shortness of breath, palpitations.  He notes lightheadedness is generally stable and occurs infrequently.  Fall: Patient notes he possibly fell out of bed last night while he was asleep.  He hit his head.  He and his daughter report he was conscious after hitting his head.  He notes a little soreness where he hit his head though no other headache.  No numbness or weakness.  No vision changes.  He is not on any blood thinners.  Anxiety/depression: Patient notes this is a chronic issue.  It is generally as well-controlled as it has been historically.  He notes no active suicidal thoughts.  He is on Wellbutrin  and Lexapro .  Right knee injury: Patient had a fall sometime ago where he landed on his right knee.  Notes a swelling since then.  Social History   Tobacco Use  Smoking Status Never  Smokeless Tobacco Never    Current Outpatient Medications on File Prior to Visit  Medication Sig Dispense Refill   atorvastatin  (LIPITOR) 40 MG tablet TAKE 1 TABLET BY MOUTH EVERY DAY 90 tablet 1   Blood Glucose Monitoring Suppl (ONE TOUCH BASIC SYSTEM) W/DEVICE KIT To use to check blood sugars once daily 1 each 0   buPROPion  (WELLBUTRIN  XL) 300 MG 24 hr tablet TAKE 1 TABLET BY MOUTH EVERY DAY 90 tablet 1   cholecalciferol (VITAMIN D ) 1000 UNITS tablet Take 1,000 Units by mouth daily.     escitalopram  (LEXAPRO ) 20 MG tablet TAKE 1 TABLET BY MOUTH EVERY DAY 90 tablet 1   glucose blood test strip To use to check blood sugar once daily 100 each 12   levothyroxine  (SYNTHROID ) 50 MCG tablet TAKE 1 TABLET BY MOUTH EVERY DAY 90 tablet 1   lisinopril  (ZESTRIL ) 10 MG tablet Take 1 tablet (10 mg total) by mouth daily. 90 tablet 1    meloxicam  (MOBIC ) 15 MG tablet Take 1 tablet (15 mg total) by mouth daily as needed. 30 tablet 1   metFORMIN  (GLUCOPHAGE ) 500 MG tablet TAKE 2 TABLETS BY MOUTH EVERY MORNING AND 2 TABLETS EVERY EVENING 360 tablet 1   omeprazole  (PRILOSEC) 40 MG capsule Take 1 capsule (40 mg total) by mouth daily. 180 capsule 2   ONE TOUCH LANCETS MISC To use to check blood sugar once daily 100 each 3   Semaglutide ,0.25 or 0.5MG /DOS, (OZEMPIC , 0.25 OR 0.5 MG/DOSE,) 2 MG/3ML SOPN INJECT 0.5 MG INTO THE SKIN ONE TIME PER WEEK 9 mL 1   tamsulosin  (FLOMAX ) 0.4 MG CAPS capsule Take 2 capsules (0.8 mg total) by mouth daily. 60 capsule 5   No current facility-administered medications on file prior to visit.     ROS see history of present illness  Objective  Physical Exam Vitals:   12/14/23 1103  BP: 124/70  Pulse: 66  Temp: 97.6 F (36.4 C)  SpO2: 98%    BP Readings from Last 3 Encounters:  12/14/23 124/70  10/29/23 130/80  06/27/23 120/70   Wt Readings from Last 3 Encounters:  12/14/23 195 lb 9.6 oz (88.7 kg)  10/29/23 187 lb 8 oz (85 kg)  06/27/23 211 lb (95.7 kg)    Physical Exam Constitutional:      General: He is not in acute distress.  Appearance: He is not diaphoretic.  HENT:     Head:   Cardiovascular:     Rate and Rhythm: Normal rate and regular rhythm.     Heart sounds: Normal heart sounds.  Pulmonary:     Effort: Pulmonary effort is normal.     Breath sounds: Normal breath sounds.  Musculoskeletal:     Comments: Right knee with prepatellar bursitis without tenderness or overlying skin changes  Skin:    General: Skin is warm and dry.  Neurological:     Mental Status: He is alert.     Comments: CN 3-12 intact, 5/5 strength in bilateral biceps, triceps, grip, quads, hamstrings, plantar and dorsiflexion, sensation to light touch intact in bilateral UE and LE, normal gait      Assessment/Plan: Please see individual problem list.  Primary hypertension Assessment &  Plan: Chronic issue.  Adequately controlled.  He will continue lisinopril  10 mg daily.  He will follow-up as scheduled.   Injury of head, initial encounter Assessment & Plan: Patient with very minor head injury after falling out of bed.  It does not sound as though he had a syncopal episode.  He is neurologically intact and is generally asymptomatic other than minimal soreness at the area where his head hit the floor.  Advised monitoring for any neurological symptoms or headaches and if those occur he needs to go to the emergency room.  Offered referral for physical therapy for falls prevention.  Patient declined at this time.   Moderate episode of recurrent major depressive disorder [facility]) Assessment & Plan: Chronic issue.  Stable.  Patient will continue Wellbutrin  300 mg daily and Lexapro  20 mg daily.  Advised to seek medical attention if he develops active suicidal thoughts.   Patellar bursitis of right knee Assessment & Plan: No signs of infection.  Referral to orthopedics to consider drainage though he declines.  They will monitor for infection.     Return for As scheduled.   Timothy Her, MD [facility]

## 2023-12-14 NOTE — Assessment & Plan Note (Signed)
 Chronic issue.  Stable.  Patient will continue Wellbutrin 300 mg daily and Lexapro 20 mg daily.  Advised to seek medical attention if he develops active suicidal thoughts.

## 2023-12-14 NOTE — Assessment & Plan Note (Signed)
 Chronic issue.  Adequately controlled.  He will continue lisinopril 10 mg daily.  He will follow-up as scheduled.

## 2023-12-14 NOTE — Assessment & Plan Note (Signed)
 No signs of infection.  Referral to orthopedics to consider drainage though he declines.  They will monitor for infection.

## 2024-01-12 ENCOUNTER — Other Ambulatory Visit: Payer: Self-pay | Admitting: Family Medicine

## 2024-01-12 DIAGNOSIS — M1990 Unspecified osteoarthritis, unspecified site: Secondary | ICD-10-CM

## 2024-01-23 ENCOUNTER — Other Ambulatory Visit: Payer: Self-pay | Admitting: Family Medicine

## 2024-01-29 ENCOUNTER — Encounter: Payer: Self-pay | Admitting: Nurse Practitioner

## 2024-01-29 ENCOUNTER — Ambulatory Visit (INDEPENDENT_AMBULATORY_CARE_PROVIDER_SITE_OTHER): Payer: Medicare Other | Admitting: Nurse Practitioner

## 2024-01-29 VITALS — BP 118/66 | HR 80 | Temp 97.7°F | Ht 71.0 in | Wt 200.2 lb

## 2024-01-29 DIAGNOSIS — E1169 Type 2 diabetes mellitus with other specified complication: Secondary | ICD-10-CM | POA: Diagnosis not present

## 2024-01-29 DIAGNOSIS — R351 Nocturia: Secondary | ICD-10-CM

## 2024-01-29 DIAGNOSIS — E039 Hypothyroidism, unspecified: Secondary | ICD-10-CM | POA: Diagnosis not present

## 2024-01-29 DIAGNOSIS — Z7984 Long term (current) use of oral hypoglycemic drugs: Secondary | ICD-10-CM | POA: Diagnosis not present

## 2024-01-29 DIAGNOSIS — Z7985 Long-term (current) use of injectable non-insulin antidiabetic drugs: Secondary | ICD-10-CM | POA: Diagnosis not present

## 2024-01-29 DIAGNOSIS — E669 Obesity, unspecified: Secondary | ICD-10-CM | POA: Diagnosis not present

## 2024-01-29 DIAGNOSIS — E785 Hyperlipidemia, unspecified: Secondary | ICD-10-CM

## 2024-01-29 DIAGNOSIS — F331 Major depressive disorder, recurrent, moderate: Secondary | ICD-10-CM | POA: Diagnosis not present

## 2024-01-29 DIAGNOSIS — N401 Enlarged prostate with lower urinary tract symptoms: Secondary | ICD-10-CM | POA: Diagnosis not present

## 2024-01-29 DIAGNOSIS — I10 Essential (primary) hypertension: Secondary | ICD-10-CM

## 2024-01-29 NOTE — Assessment & Plan Note (Signed)
 Chronic. No urinary symptoms reported. Continue Flomax 0.8 mg daily.

## 2024-01-29 NOTE — Assessment & Plan Note (Signed)
 Chronic depression shows improvement with Wellbutrin XL and Lexapro. No active suicidal ideation or plans. Continue Wellbutrin XL 300 mg daily and Lexapro 20 mg daily. Encouraged to contact if worsening symptoms, unusual behavior changes or suicidal thoughts occur.

## 2024-01-29 NOTE — Assessment & Plan Note (Addendum)
 Chronic. No symptoms reported. TSH stable. Continue Levothyroxine 50 mcg daily. We will check TSH at next appointment.

## 2024-01-29 NOTE — Progress Notes (Signed)
 Bethanie Dicker, NP-C Phone: [phone]  Timothy Edwards is a 78 y.o. male who presents today for transfer of care.   Discussed the use of AI scribe software for clinical note transcription with the patient, who gave verbal consent to proceed.  History of Present Illness   Timothy Edwards is a 78 year old male who presents for transfer of care.   He has hypertension and is currently taking lisinopril 10 mg daily. His blood pressure readings at home are usually around 120 mmHg systolic, with today's reading at 118/66 mmHg. He experiences occasional dizziness, which previously led to the discontinuation of HCTZ. The dizziness occurs randomly, including an incident where he fell out of bed.  He manages his diabetes with metformin and Ozempic, regularly monitoring his blood sugar levels, which are typically within the normal range and never exceed 106 mg/dL. No symptoms of hypoglycemia. He administers his Ozempic injections as prescribed.  He takes Lipitor for hyperlipidemia and has no muscle aches or abdominal pain. He also takes levothyroxine for hypothyroidism and reports no symptoms of heart palpitations, temperature intolerance, or skin changes.  He is on Wellbutrin and Lexapro for depression, which he describes as 'awful forever,' but notes improvement from previous treatments. No active suicidal ideation or plans, although he acknowledges having thoughts of self-harm in the past.  He takes Flomax to assist with urination and reports no issues with urination.      Social History   Tobacco Use  Smoking Status Never  Smokeless Tobacco Never    Current Outpatient Medications on File Prior to Visit  Medication Sig Dispense Refill   atorvastatin (LIPITOR) 40 MG tablet TAKE 1 TABLET BY MOUTH EVERY DAY 90 tablet 1   Blood Glucose Monitoring Suppl (ONE TOUCH BASIC SYSTEM) W/DEVICE KIT To use to check blood sugars once daily 1 each 0   buPROPion (WELLBUTRIN XL) 300 MG 24 hr tablet  TAKE 1 TABLET BY MOUTH EVERY DAY 90 tablet 1   cholecalciferol (VITAMIN D) 1000 UNITS tablet Take 1,000 Units by mouth daily.     escitalopram (LEXAPRO) 20 MG tablet TAKE 1 TABLET BY MOUTH EVERY DAY 90 tablet 1   glucose blood test strip To use to check blood sugar once daily 100 each 12   levothyroxine (SYNTHROID) 50 MCG tablet TAKE 1 TABLET BY MOUTH EVERY DAY 90 tablet 1   lisinopril (ZESTRIL) 10 MG tablet Take 1 tablet (10 mg total) by mouth daily. 90 tablet 1   meloxicam (MOBIC) 15 MG tablet TAKE 1 TABLET BY MOUTH DAILY AS NEEDED. 30 tablet 1   metFORMIN (GLUCOPHAGE) 500 MG tablet TAKE 2 TABLETS BY MOUTH EVERY MORNING AND 2 TABLETS EVERY EVENING 360 tablet 1   omeprazole (PRILOSEC) 40 MG capsule Take 1 capsule (40 mg total) by mouth daily. 180 capsule 2   ONE TOUCH LANCETS MISC To use to check blood sugar once daily 100 each 3   Semaglutide,0.25 or 0.5MG /DOS, (OZEMPIC, 0.25 OR 0.5 MG/DOSE,) 2 MG/3ML SOPN INJECT 0.5 MG INTO THE SKIN ONE TIME PER WEEK 9 mL 1   tamsulosin (FLOMAX) 0.4 MG CAPS capsule TAKE 2 CAPSULES BY MOUTH EVERY DAY 60 capsule 0   No current facility-administered medications on file prior to visit.     ROS see history of present illness  Objective  Physical Exam Vitals:   01/29/24 1407  BP: 118/66  Pulse: 80  Temp: 97.7 F (36.5 C)  SpO2: 96%    BP Readings from Last 3 Encounters:  01/29/24 118/66  12/14/23 124/70  10/29/23 130/80   Wt Readings from Last 3 Encounters:  01/29/24 200 lb 3.2 oz (90.8 kg)  12/14/23 195 lb 9.6 oz (88.7 kg)  10/29/23 187 lb 8 oz (85 kg)    Physical Exam Constitutional:      General: He is not in acute distress.    Appearance: Normal appearance.  HENT:     Head: Normocephalic.  Cardiovascular:     Rate and Rhythm: Normal rate and regular rhythm.     Heart sounds: Normal heart sounds.  Pulmonary:     Effort: Pulmonary effort is normal.     Breath sounds: Normal breath sounds.  Skin:    General: Skin is warm and dry.   Neurological:     General: No focal deficit present.     Mental Status: He is alert.  Psychiatric:        Mood and Affect: Mood normal.        Behavior: Behavior normal.    Assessment/Plan: Please see individual problem list.  Type 2 diabetes mellitus with obesity (HCC) Assessment & Plan: Chronic. Blood sugar is well controlled with Metformin and Ozempic. Continue Metformin 1000mg  twice daily and Ozempic 0.5mg  weekly. Last A1c- 5.5. Encouraged healthy diet and remaining active. We will check A1c at next appointment.   Orders: -     Microalbumin / creatinine urine ratio  Hypothyroidism, unspecified type Assessment & Plan: Chronic. No symptoms reported. TSH stable. Continue Levothyroxine 50 mcg daily. We will check TSH at next appointment.   Primary hypertension Assessment & Plan: Chronic. Blood pressure is well controlled with Lisinopril 10mg  daily. Experiences intermittent dizziness, cause unclear. Continue Lisinopril 10mg  daily. Encouraged adequate hydration. Continue to monitor BP at home.    Moderate episode of recurrent major depressive disorder (HCC) Assessment & Plan: Chronic depression shows improvement with Wellbutrin XL and Lexapro. No active suicidal ideation or plans. Continue Wellbutrin XL 300 mg daily and Lexapro 20 mg daily. Encouraged to contact if worsening symptoms, unusual behavior changes or suicidal thoughts occur.    Hyperlipidemia, unspecified hyperlipidemia type Assessment & Plan: Chronic. Continue Lipitor 40 mg daily as prescribed.   Benign prostatic hyperplasia with nocturia Assessment & Plan: Chronic. No urinary symptoms reported. Continue Flomax 0.8 mg daily.      Return in about 5 months (around 06/16/2024) for Follow up.   Bethanie Dicker, NP-C [place] [facility]

## 2024-01-29 NOTE — Assessment & Plan Note (Signed)
 Chronic. Continue Lipitor 40 mg daily as prescribed.

## 2024-01-29 NOTE — Assessment & Plan Note (Addendum)
 Chronic. Blood pressure is well controlled with Lisinopril 10mg  daily. Experiences intermittent dizziness, cause unclear. Continue Lisinopril 10mg  daily. Encouraged adequate hydration. Continue to monitor BP at home.

## 2024-01-29 NOTE — Assessment & Plan Note (Signed)
 Chronic. Blood sugar is well controlled with Metformin and Ozempic. Continue Metformin 1000mg  twice daily and Ozempic 0.5mg  weekly. Last A1c- 5.5. Encouraged healthy diet and remaining active. We will check A1c at next appointment.

## 2024-01-30 LAB — MICROALBUMIN / CREATININE URINE RATIO
Creatinine,U: 92.6 mg/dL
Microalb Creat Ratio: 7.6 mg/g (ref 0.0–30.0)
Microalb, Ur: <0.7 mg/dL (ref 0.0–1.9)

## 2024-02-21 ENCOUNTER — Other Ambulatory Visit (HOSPITAL_COMMUNITY): Payer: Self-pay

## 2024-02-21 ENCOUNTER — Telehealth: Payer: Self-pay | Admitting: Pharmacy Technician

## 2024-02-21 NOTE — Telephone Encounter (Signed)
 Pharmacy Patient Advocate Encounter   Received notification from CoverMyMeds that prior authorization for Ozempic (0.25 or 0.5 MG/DOSE) 2MG /3ML pen-injectors is required/requested.   Insurance verification completed.   The patient is insured through [facility] .   Per test claim: PA required; PA submitted to above mentioned insurance via CoverMyMeds Key/confirmation #/EOC [id number] Status is pending

## 2024-02-22 ENCOUNTER — Other Ambulatory Visit (HOSPITAL_COMMUNITY): Payer: Self-pay

## 2024-02-22 NOTE — Telephone Encounter (Signed)
 Pharmacy Patient Advocate Encounter  Received notification from [facility] that Prior Authorization for Ozempic (0.25 or 0.5 MG/DOSE) 2MG /3ML pen-injectors has been APPROVED from 02/21/24 to 12/03/2024. Unable to obtain price due to refill too soon rejection, last fill date 02/21/2024 next available fill date05/22/2025   PA #/Case ID/Reference #: [id number]

## 2024-02-25 ENCOUNTER — Other Ambulatory Visit: Payer: Self-pay

## 2024-02-25 MED ORDER — TAMSULOSIN HCL 0.4 MG PO CAPS: 0.8000 mg | ORAL_CAPSULE | Freq: Every day | ORAL | 1 refills | Status: DC

## 2024-03-11 ENCOUNTER — Other Ambulatory Visit: Payer: Self-pay

## 2024-03-11 DIAGNOSIS — E1169 Type 2 diabetes mellitus with other specified complication: Secondary | ICD-10-CM

## 2024-03-11 MED ORDER — ESCITALOPRAM OXALATE 20 MG PO TABS
20.0000 mg | ORAL_TABLET | Freq: Every day | ORAL | 1 refills | Status: DC
Start: 2024-03-11 — End: 2024-09-08

## 2024-03-11 MED ORDER — METFORMIN HCL 500 MG PO TABS: ORAL_TABLET | ORAL | 3 refills | Status: AC

## 2024-03-11 MED ORDER — BUPROPION HCL ER (XL) 300 MG PO TB24: 300.0000 mg | ORAL_TABLET | Freq: Every day | ORAL | 3 refills | Status: AC

## 2024-03-11 MED ORDER — ATORVASTATIN CALCIUM 40 MG PO TABS: 40.0000 mg | ORAL_TABLET | Freq: Every day | ORAL | 3 refills | Status: AC

## 2024-03-11 MED ORDER — LEVOTHYROXINE SODIUM 50 MCG PO TABS: 50.0000 ug | ORAL_TABLET | Freq: Every day | ORAL | 3 refills | Status: AC

## 2024-03-11 MED ORDER — LISINOPRIL 10 MG PO TABS: 10.0000 mg | ORAL_TABLET | Freq: Every day | ORAL | 3 refills | Status: AC

## 2024-03-11 NOTE — Telephone Encounter (Signed)
 Copied from CRM [id number]. Topic: Clinical - Medical Advice >> Mar 11, 2024  4:01 PM Elizebeth Brooking wrote: Reason for CRM: Patient called in regarding to needing NP Bethanie Dicker or her nurse to give him a callback as he needs someone to call his insurance [employer] to confirm the information for him would like a callback    [phone]

## 2024-03-12 ENCOUNTER — Telehealth: Payer: Self-pay

## 2024-03-12 NOTE — Telephone Encounter (Signed)
 Copied from CRM [id number]. Topic: Clinical - Medical Advice >> Mar 11, 2024  4:01 PM Elizebeth Brooking wrote: Reason for CRM: Patient called in regarding to needing NP Bethanie Dicker or her nurse to give him a callback as he needs someone to call his insurance [employer] to confirm the information for him would like a callback    [phone] >> Mar 12, 2024  3:53 PM Morrie Sheldon D wrote: Patient called stating he is checking on a chronic special needs form that was sent from his insurance to the provider. Patient need to know if his provider has the form and the status of it CB [phone]

## 2024-03-12 NOTE — Telephone Encounter (Signed)
 Called pt and informed him that we have not received any paper work for him from his insurance, pt read a letter he received from them stating that they have not heard back from [place] from the questionnaire and I informed him that I have checked Dr. Juluis Pitch e-fax folder as well as Malva Cogan and I do not see any forms for him. Pt informed that there was a [phone] # in the letter that stated if he had any questions to reach out. I informed the pt that he should reach out and ask him if they can resend it to our office and if we need it as we are not familiar with a questionnaire form for his insurance. Pt verbalized understanding and stated he will do that and provide them our office number to get in contact with [place].

## 2024-03-13 NOTE — Telephone Encounter (Signed)
 Will document when forms are received as of now forms have not been received

## 2024-03-13 NOTE — Telephone Encounter (Signed)
 Copied from CRM [id number]. Topic: General - Other >> Mar 12, 2024  4:51 PM Fuller Mandril wrote: Reason for CRM: Received a call from [facility] customer support. They are faxing request for provider to complete. Needs documentation that patient has chronic disease. Thank You

## 2024-03-14 ENCOUNTER — Other Ambulatory Visit: Payer: Self-pay

## 2024-03-14 DIAGNOSIS — M1990 Unspecified osteoarthritis, unspecified site: Secondary | ICD-10-CM

## 2024-03-17 ENCOUNTER — Other Ambulatory Visit: Payer: Self-pay

## 2024-03-17 MED ORDER — MELOXICAM 15 MG PO TABS: 15.0000 mg | ORAL_TABLET | Freq: Every day | ORAL | 1 refills | Status: DC | PRN

## 2024-03-17 MED ORDER — OMEPRAZOLE 40 MG PO CPDR: 40.0000 mg | DELAYED_RELEASE_CAPSULE | Freq: Every day | ORAL | 3 refills | Status: AC

## 2024-03-18 ENCOUNTER — Telehealth: Payer: Self-pay

## 2024-03-18 NOTE — Telephone Encounter (Signed)
 Copied from CRM [id number]. Topic: General - Other >> Mar 18, 2024  4:05 PM Albertha Alosa wrote: Reason for CRM: Patient called  in stating he had a missed call from this number, would like for someone to give him a callback regarding this

## 2024-03-18 NOTE — Telephone Encounter (Signed)
 Nobody called today.

## 2024-03-31 ENCOUNTER — Telehealth: Payer: Self-pay

## 2024-03-31 NOTE — Telephone Encounter (Signed)
 Copied from CRM [id number]. Topic: General - Other >> Mar 31, 2024  1:31 PM Ovid Blow wrote: Reason for CRM: Mia from [employer] need a verbal on whether patient was diabetic with heart issues. Please call [phone]

## 2024-03-31 NOTE — Telephone Encounter (Signed)
 Called and confirmed diabetes

## 2024-05-17 ENCOUNTER — Other Ambulatory Visit: Payer: Self-pay | Admitting: Nurse Practitioner

## 2024-05-17 DIAGNOSIS — M1990 Unspecified osteoarthritis, unspecified site: Secondary | ICD-10-CM

## 2024-05-27 ENCOUNTER — Other Ambulatory Visit: Payer: Self-pay

## 2024-05-27 MED ORDER — OZEMPIC (0.25 OR 0.5 MG/DOSE) 2 MG/3ML ~~LOC~~ SOPN: 0.5000 mg | PEN_INJECTOR | SUBCUTANEOUS | 1 refills | Status: DC

## 2024-05-28 ENCOUNTER — Telehealth: Payer: Self-pay

## 2024-05-28 NOTE — Telephone Encounter (Signed)
 Copied from CRM [id number]. Topic: Clinical - Prescription Issue >> May 28, 2024  4:36 PM Rea C wrote: Reason for CRM: Semaglutide ,0.25 or 0.5MG /DOS, (OZEMPIC , 0.25 OR 0.5 MG/DOSE,) 2 MG/3ML SOPN [id number] [place], [place] - [address] [place] [address] Phone: [phone] Fax: [phone]  Patient was contacted by pharmacy and was told that they don't have anymore prescriptions for Ozempic  and they need it to be authorized again by provider.  Patients contact is [phone].

## 2024-05-28 NOTE — Telephone Encounter (Signed)
 Copied from CRM [id number]. Topic: Clinical - Medication Prior Auth >> May 28, 2024  4:39 PM Rea C wrote: Reason for CRM: Communication Reason for CRM: Semaglutide ,0.25 or 0.5MG /DOS, (OZEMPIC , 0.25 OR 0.5 MG/DOSE,) 2 MG/3ML SOPN  [id number] - [place], [place] - [address] [place] [address] Phone: [phone] Fax: [phone]  Patient was contacted by pharmacy and was told that they don't have anymore prescriptions for Ozempic  and they need it to be authorized again by provider.  Patients contact is [phone].

## 2024-05-29 NOTE — Telephone Encounter (Signed)
 Detailed vm left informing pt that I spoke with his pharmacy and they state his Rx is ready for pick up

## 2024-06-11 ENCOUNTER — Ambulatory Visit (INDEPENDENT_AMBULATORY_CARE_PROVIDER_SITE_OTHER): Admitting: *Deleted

## 2024-06-11 VITALS — Ht 71.0 in | Wt 200.0 lb

## 2024-06-11 DIAGNOSIS — Z0000 Encounter for general adult medical examination without abnormal findings: Secondary | ICD-10-CM

## 2024-06-11 NOTE — Patient Instructions (Signed)
 Timothy Edwards , Thank you for taking time out of your busy schedule to complete your Annual Wellness Visit with me. I enjoyed our conversation and look forward to speaking with you again next year. I, as well as your care team,  appreciate your ongoing commitment to your health goals. Please review the following plan we discussed and let me know if I can assist you in the future. Your Game plan/ To Do List    Referrals: If you haven't heard from the office you've been referred to, please reach out to them at the phone provided.  Remember to call and schedule a diabetic eye exam at [facility]. Follow up Visits: Next Medicare AWV with our clinical staff: 06/16/25 @ 1:00   Have you seen your provider in the last 6 months (3 months if uncontrolled diabetes)? Yes Next Office Visit with your provider: 06/27/24  Clinician Recommendations:  Aim for 30 minutes of exercise or brisk walking, 6-8 glasses of water, and 5 servings of fruits and vegetables each day.       This is a list of the screening recommended for you and due dates:  Health Maintenance  Topic Date Due   Eye exam for diabetics  11/01/2021   Complete foot exam   08/12/2023   Hemoglobin A1C  04/27/2024   Flu Shot  07/04/2024   Yearly kidney function blood test for diabetes  10/28/2024   Yearly kidney health urinalysis for diabetes  01/28/2025   Medicare Annual Wellness Visit  06/11/2025   DTaP/Tdap/Td vaccine (3 - Td or Tdap) 08/02/2032   Pneumococcal Vaccine for age over 8  Completed   Hepatitis C Screening  Completed   Hepatitis B Vaccine  Aged Out   HPV Vaccine  Aged Out   Meningitis B Vaccine  Aged Out   Colon Cancer Screening  Discontinued   COVID-19 Vaccine  Discontinued   Zoster (Shingles) Vaccine  Discontinued    Advanced directives: (ACP Link)Information on Advanced Care Planning can be found at   Secretary of [facility] Advance Health Care Directives Advance Health Care Directives. [email or web address]  Advance Care  Planning is important because it:  [x]  Makes sure you receive the medical care that is consistent with your values, goals, and preferences  [x]  It provides guidance to your family and loved ones and reduces their decisional burden about whether or not they are making the right decisions based on your wishes.  Follow the link provided in your after visit summary or read over the paperwork we have mailed to you to help you started getting your Advance Directives in place. If you need assistance in completing these, please reach out to us  so that we can help you!

## 2024-06-11 NOTE — Progress Notes (Signed)
 Subjective:   Timothy Edwards is a 78 y.o. who presents for a Medicare Wellness preventive visit.  As a reminder, Annual Wellness Visits don't include a physical exam, and some assessments may be limited, especially if this visit is performed virtually. We may recommend an in-person follow-up visit with your provider if needed.  Visit Complete: Virtual I connected with  Timothy Edwards on 06/11/24 by a audio enabled telemedicine application and verified that I am speaking with the correct person using two identifiers.  Patient Location: Home  Provider Location: Home Office  I discussed the limitations of evaluation and management by telemedicine. The patient expressed understanding and agreed to proceed.  Vital Signs: Because this visit was a virtual/telehealth visit, some criteria may be missing or patient reported. Any vitals not documented were not able to be obtained and vitals that have been documented are patient reported.  VideoDeclined- This patient declined Librarian, academic. Therefore the visit was completed with audio only.  Persons Participating in Visit: Patient.  AWV Questionnaire: Yes: Patient Medicare AWV questionnaire was completed by the patient on 06/11/24; I have confirmed that all information answered by patient is correct and no changes since this date.  Cardiac Risk Factors include: advanced age (>36men, >42 women);diabetes mellitus;male gender;hypertension;dyslipidemia     Objective:    Today's Vitals   06/11/24 1301 06/11/24 1302  Weight: 200 lb (90.7 kg)   Height: 5' 11 (1.803 m)   PainSc:  5    Body mass index is 27.89 kg/m.     06/11/2024    1:36 PM 06/11/2024    1:20 PM 07/15/2018    8:08 AM  Advanced Directives  Does Patient Have a Medical Advance Directive? No Yes No   Type of Special educational needs teacher of [place];Living will   Copy of Healthcare Power of Attorney in Chart?  No - copy requested    Would patient like information on creating a medical advance directive? No - Patient declined       Data saved with a previous flowsheet row definition    Current Medications (verified) Outpatient Encounter Medications as of 06/11/2024  Medication Sig   atorvastatin  (LIPITOR) 40 MG tablet Take 1 tablet (40 mg total) by mouth daily.   Blood Glucose Monitoring Suppl (ONE TOUCH BASIC SYSTEM) W/DEVICE KIT To use to check blood sugars once daily   buPROPion  (WELLBUTRIN  XL) 300 MG 24 hr tablet Take 1 tablet (300 mg total) by mouth daily.   cholecalciferol (VITAMIN D ) 1000 UNITS tablet Take 1,000 Units by mouth daily.   escitalopram  (LEXAPRO ) 20 MG tablet Take 1 tablet (20 mg total) by mouth daily.   glucose blood test strip To use to check blood sugar once daily   levothyroxine  (SYNTHROID ) 50 MCG tablet Take 1 tablet (50 mcg total) by mouth daily.   lisinopril  (ZESTRIL ) 10 MG tablet Take 1 tablet (10 mg total) by mouth daily.   meloxicam  (MOBIC ) 15 MG tablet TAKE 1 TABLET BY MOUTH DAILY AS NEEDED.   metFORMIN  (GLUCOPHAGE ) 500 MG tablet TAKE 2 TABLETS BY MOUTH EVERY MORNING AND 2 TABLETS EVERY EVENING   omeprazole  (PRILOSEC) 40 MG capsule Take 1 capsule (40 mg total) by mouth daily.   ONE TOUCH LANCETS MISC To use to check blood sugar once daily   Semaglutide ,0.25 or 0.5MG /DOS, (OZEMPIC , 0.25 OR 0.5 MG/DOSE,) 2 MG/3ML SOPN Inject 0.5 mg into the skin once a week.   tamsulosin  (FLOMAX ) 0.4 MG CAPS capsule Take 2  capsules (0.8 mg total) by mouth daily.   No facility-administered encounter medications on file as of 06/11/2024.    Allergies (verified) Patient has no known allergies.   History: Past Medical History:  Diagnosis Date   Depression    Diabetes mellitus without complication (HCC)    Hyperlipidemia    Hypertension    Unspecified hypothyroidism    Vitamin D  deficiency    Past Surgical History:  Procedure Laterality Date   ESOPHAGOGASTRODUODENOSCOPY (EGD) WITH PROPOFOL  N/A  07/15/2018   Procedure: ESOPHAGOGASTRODUODENOSCOPY (EGD) WITH PROPOFOL ;  Surgeon: Unk Corinn Skiff, MD;  Location: [facility] ENDOSCOPY;  Service: Gastroenterology;  Laterality: N/A;   FOOT SURGERY     Family History  Problem Relation Age of Onset   Obesity Father    Aneurysm Father    Hypertension Father    Gallbladder disease Mother    Social History   Socioeconomic History   Marital status: Married    Spouse name: Not on file   Number of children: Not on file   Years of education: Not on file   Highest education level: Bachelor's degree (e.g., BA, AB, BS)  Occupational History   Not on file  Tobacco Use   Smoking status: Never   Smokeless tobacco: Never  Vaping Use   Vaping status: Never Used  Substance and Sexual Activity   Alcohol use: Yes    Alcohol/week: 3.0 standard drinks of alcohol    Types: 3 Glasses of wine per week   Drug use: Never   Sexual activity: Not on file  Other Topics Concern   Not on file  Social History Narrative   married   Social Drivers of Corporate investment banker Strain: Low Risk  (06/11/2024)   Overall Financial Resource Strain (CARDIA)    Difficulty of Paying Living Expenses: Not hard at all  Food Insecurity: No Food Insecurity (06/11/2024)   Hunger Vital Sign    Worried About Running Out of Food in the Last Year: Never true    Ran Out of Food in the Last Year: Never true  Transportation Needs: No Transportation Needs (06/11/2024)   PRAPARE - Administrator, Civil Service (Medical): No    Lack of Transportation (Non-Medical): No  Physical Activity: Inactive (06/11/2024)   Exercise Vital Sign    Days of Exercise per Week: 0 days    Minutes of Exercise per Session: 0 min  Stress: Stress Concern Present (06/11/2024)   [employer] of Occupational Health - Occupational Stress Questionnaire    Feeling of Stress: To some extent  Social Connections: Moderately Isolated (06/11/2024)   Social Connection and Isolation Panel     Frequency of Communication with Friends and Family: Once a week    Frequency of Social Gatherings with Friends and Family: More than three times a week    Attends Religious Services: Never    Database administrator or Organizations: No    Attends Engineer, structural: Never    Marital Status: Married    Tobacco Counseling Counseling given: Not Answered    Clinical Intake:  Pre-visit preparation completed: Yes  Pain : 0-10 Pain Score: 5  Pain Type: Chronic pain Pain Location: Knee Pain Descriptors / Indicators: Nagging Pain Frequency: Intermittent     BMI - recorded: 27.89 Nutritional Status: BMI 25 -29 Overweight Nutritional Risks: None Diabetes: Yes CBG done?: No Did pt. bring in CBG monitor from home?: No  Lab Results  Component Value Date   HGBA1C 5.5 10/29/2023  HGBA1C 15.4 Repeated and verified X2. (H) 06/13/2023   HGBA1C 6.4 12/13/2022     How often do you need to have someone help you when you read instructions, pamphlets, or other written materials from your doctor or pharmacy?: 1 - Never  Interpreter Needed?: No  Information entered by :: R. Makaelah Cranfield LPN   Activities of Daily Living     06/11/2024    1:45 AM  In your present state of health, do you have any difficulty performing the following activities:  Hearing? 1  Vision? 0  Comment readers  Difficulty concentrating or making decisions? 1  Walking or climbing stairs? 1  Dressing or bathing? 0  Doing errands, shopping? 0  Preparing Food and eating ? N  Using the Toilet? N  In the past six months, have you accidently leaked urine? N  Do you have problems with loss of bowel control? N  Managing your Medications? N  Managing your Finances? N  Housekeeping or managing your Housekeeping? N    Patient Care Team: Gretel App, NP as PCP - General (Nurse Practitioner) Geronimo Manuelita SAUNDERS, [facility] (Pharmacist)  I have updated your Care Teams any recent Medical Services you may have received from  other providers in the past year.     Assessment:   This is a routine wellness examination for Timothy Edwards.  Hearing/Vision screen Hearing Screening - Comments:: A little problem at times Vision Screening - Comments:: readers   Goals Addressed             This Visit's Progress    Patient Stated       Would like to get his energy back       Depression Screen     06/11/2024    1:12 PM 01/29/2024    2:08 PM 12/14/2023   11:05 AM 10/29/2023    9:36 AM 06/13/2023   11:56 AM 12/13/2022   12:10 PM 08/02/2022   12:04 PM  PHQ 2/9 Scores  PHQ - 2 Score 2 3 2 2 4  0 0  PHQ- 9 Score 9 12 11 14 18       Fall Risk     06/11/2024    1:45 AM 01/29/2024    2:08 PM 12/14/2023   11:04 AM 10/29/2023    9:36 AM 06/13/2023   11:57 AM  Fall Risk   Falls in the past year? 1 1 1 1 1   Number falls in past yr: 1 1 1 1 1   Injury with Fall? 0 1 1 0 0  Risk for fall due to : History of fall(s);Impaired balance/gait History of fall(s) Other (Comment) No Fall Risks   Follow up Falls evaluation completed;Falls prevention discussed Falls evaluation completed Falls evaluation completed      MEDICARE RISK AT HOME:  Medicare Risk at Home Any stairs in or around the home?: (Patient-Rptd) Yes If so, are there any without handrails?: (Patient-Rptd) No Home free of loose throw rugs in walkways, pet beds, electrical cords, etc?: (Patient-Rptd) No Adequate lighting in your home to reduce risk of falls?: (Patient-Rptd) Yes Life alert?: (Patient-Rptd) No Use of a cane, walker or w/c?: (Patient-Rptd) No Grab bars in the bathroom?: (Patient-Rptd) No Shower chair or bench in shower?: (Patient-Rptd) No Elevated toilet seat or a handicapped toilet?: (Patient-Rptd) No  TIMED UP AND GO:  Was the test performed?  No  Cognitive Function: 6CIT completed        06/11/2024    1:23 PM  6CIT Screen  What month? 0  points  What time? 0 points  Count back from 20 0 points  Months in reverse 0 points  Repeat phrase 10  points    Immunizations Immunization History  Administered Date(s) Administered   Influenza Split 10/26/2014   Influenza, High Dose Seasonal PF 09/28/2016, 09/11/2017   Influenza,inj,Quad PF,6+ Mos 11/02/2015   Pneumococcal Conjugate-13 12/20/2015   Pneumococcal Polysaccharide-23 06/09/2013   Td 08/02/2022   Tdap 01/19/2010    Screening Tests Health Maintenance  Topic Date Due   OPHTHALMOLOGY EXAM  11/01/2021   FOOT EXAM  08/12/2023   HEMOGLOBIN A1C  04/27/2024   INFLUENZA VACCINE  07/04/2024   Diabetic kidney evaluation - eGFR measurement  10/28/2024   Diabetic kidney evaluation - Urine ACR  01/28/2025   Medicare Annual Wellness (AWV)  06/11/2025   DTaP/Tdap/Td (3 - Td or Tdap) 08/02/2032   Pneumococcal Vaccine: 50+ Years  Completed   Hepatitis C Screening  Completed   Hepatitis B Vaccines  Aged Out   HPV VACCINES  Aged Out   Meningococcal B Vaccine  Aged Out   Colonoscopy  Discontinued   COVID-19 Vaccine  Discontinued   Zoster Vaccines- Shingrix  Discontinued    Health Maintenance  Health Maintenance Due  Topic Date Due   OPHTHALMOLOGY EXAM  11/01/2021   FOOT EXAM  08/12/2023   HEMOGLOBIN A1C  04/27/2024   Health Maintenance Items Addressed: Patient needs a diabetic foot exam and A1C completed and documented at upcoming visit 06/27/24.  Patient declines vaccines.  Additional Screening:  Vision Screening: Recommended annual ophthalmology exams for early detection of glaucoma and other disorders of the eye. Overdue. [employer]. Patient was advised to call and schedule a diabetic eye exam and telephone number was given. Would you like a referral to an eye doctor? No    Dental Screening: Recommended annual dental exams for proper oral hygiene  Community Resource Referral / Chronic Care Management: CRR required this visit?  No   CCM required this visit?  No   Plan:    I have personally reviewed and noted the following in the patient's chart:   Medical and  social history Use of alcohol, tobacco or illicit drugs  Current medications and supplements including opioid prescriptions. Patient is not currently taking opioid prescriptions. Functional ability and status Nutritional status Physical activity Advanced directives List of other physicians Hospitalizations, surgeries, and ER visits in previous 12 months Vitals Screenings to include cognitive, depression, and falls Referrals and appointments  In addition, I have reviewed and discussed with patient certain preventive protocols, quality metrics, and best practice recommendations. A written personalized care plan for preventive services as well as general preventive health recommendations were provided to patient.   Angeline Fredericks, LPN   2/0/7974   After Visit Summary: (MyChart) Due to this being a telephonic visit, the after visit summary with patients personalized plan was offered to patient via MyChart   Notes: Please refer to Routing Comments.

## 2024-06-27 ENCOUNTER — Ambulatory Visit (INDEPENDENT_AMBULATORY_CARE_PROVIDER_SITE_OTHER): Payer: Medicare Other | Admitting: Nurse Practitioner

## 2024-06-27 ENCOUNTER — Ambulatory Visit: Payer: Self-pay | Admitting: Nurse Practitioner

## 2024-06-27 VITALS — BP 142/60 | HR 70 | Temp 98.2°F | Ht 71.0 in | Wt 220.4 lb

## 2024-06-27 DIAGNOSIS — E039 Hypothyroidism, unspecified: Secondary | ICD-10-CM

## 2024-06-27 DIAGNOSIS — E785 Hyperlipidemia, unspecified: Secondary | ICD-10-CM | POA: Diagnosis not present

## 2024-06-27 DIAGNOSIS — Z7984 Long term (current) use of oral hypoglycemic drugs: Secondary | ICD-10-CM | POA: Diagnosis not present

## 2024-06-27 DIAGNOSIS — F331 Major depressive disorder, recurrent, moderate: Secondary | ICD-10-CM | POA: Diagnosis not present

## 2024-06-27 DIAGNOSIS — I10 Essential (primary) hypertension: Secondary | ICD-10-CM

## 2024-06-27 DIAGNOSIS — E1169 Type 2 diabetes mellitus with other specified complication: Secondary | ICD-10-CM

## 2024-06-27 DIAGNOSIS — E669 Obesity, unspecified: Secondary | ICD-10-CM

## 2024-06-27 DIAGNOSIS — E559 Vitamin D deficiency, unspecified: Secondary | ICD-10-CM | POA: Diagnosis not present

## 2024-06-27 LAB — CBC WITH DIFFERENTIAL/PLATELET
Basophils Absolute: 0.1 K/uL (ref 0.0–0.1)
Basophils Relative: 1.2 % (ref 0.0–3.0)
Eosinophils Absolute: 0.3 K/uL (ref 0.0–0.7)
Eosinophils Relative: 6.6 % — ABNORMAL HIGH (ref 0.0–5.0)
HCT: 42.5 % (ref 39.0–52.0)
Hemoglobin: 14.1 g/dL (ref 13.0–17.0)
Lymphocytes Relative: 35.4 % (ref 12.0–46.0)
Lymphs Abs: 1.7 K/uL (ref 0.7–4.0)
MCHC: 33.1 g/dL (ref 30.0–36.0)
MCV: 92.5 fl (ref 78.0–100.0)
Monocytes Absolute: 0.5 K/uL (ref 0.1–1.0)
Monocytes Relative: 10 % (ref 3.0–12.0)
Neutro Abs: 2.3 K/uL (ref 1.4–7.7)
Neutrophils Relative %: 46.8 % (ref 43.0–77.0)
Platelets: 183 K/uL (ref 150.0–400.0)
RBC: 4.6 Mil/uL (ref 4.22–5.81)
RDW: 13.8 % (ref 11.5–15.5)
WBC: 4.9 K/uL (ref 4.0–10.5)

## 2024-06-27 LAB — LIPID PANEL
Cholesterol: 132 mg/dL (ref 0–200)
HDL: 67.8 mg/dL (ref >39.00)
LDL Cholesterol: 44 mg/dL (ref 0–99)
NonHDL: 63.7
Total CHOL/HDL Ratio: 2
Triglycerides: 97 mg/dL (ref 0.0–149.0)
VLDL: 19.4 mg/dL (ref 0.0–40.0)

## 2024-06-27 LAB — COMPREHENSIVE METABOLIC PANEL WITH GFR
ALT: 16 U/L (ref 0–53)
AST: 19 U/L (ref 0–37)
Albumin: 4.1 g/dL (ref 3.5–5.2)
Alkaline Phosphatase: 59 U/L (ref 39–117)
BUN: 14 mg/dL (ref 6–23)
CO2: 30 meq/L (ref 19–32)
Calcium: 9.5 mg/dL (ref 8.4–10.5)
Chloride: 104 meq/L (ref 96–112)
Creatinine, Ser: 0.75 mg/dL (ref 0.40–1.50)
GFR: 85.63 mL/min (ref >60.00)
Glucose, Bld: 103 mg/dL — ABNORMAL HIGH (ref 70–99)
Potassium: 4.6 meq/L (ref 3.5–5.1)
Sodium: 140 meq/L (ref 135–145)
Total Bilirubin: 0.7 mg/dL (ref 0.2–1.2)
Total Protein: 6 g/dL (ref 6.0–8.3)

## 2024-06-27 LAB — HEMOGLOBIN A1C: Hgb A1c MFr Bld: 5.8 % (ref 4.6–6.5)

## 2024-06-27 LAB — VITAMIN D 25 HYDROXY (VIT D DEFICIENCY, FRACTURES): VITD: 34.29 ng/mL (ref 30.00–100.00)

## 2024-06-27 LAB — TSH: TSH: 1.03 u[IU]/mL (ref 0.35–5.50)

## 2024-06-27 NOTE — Progress Notes (Unsigned)
 Timothy Glance, NP-C Phone: [phone]  Timothy Edwards is a 78 y.o. male who presents today for follow up.   Discussed the use of AI scribe software for clinical note transcription with the patient, who gave verbal consent to proceed.  History of Present Illness   Timothy Edwards is a 78 year old male who presents for a follow-up visit.  He adheres to his medication regimen, which includes lisinopril  twice daily for hypertension, atorvastatin  for cholesterol, Ozempic  once weekly, and metformin  for diabetes. He also takes levothyroxine  for thyroid  management and Wellbutrin  for mood stabilization. His home blood pressure readings, typically around 120 mmHg systolic, are monitored by his wife. No chest pain, shortness of breath, or leg swelling is reported.  He continues to experience chronic dizziness, which has improved somewhat since discontinuing hydrochlorothiazide , though he still experiences episodes of tripping.  Regarding diabetes management, his blood sugar levels are reportedly normal at home, with no episodes of hypoglycemia. His wife assists in monitoring these levels.  He has a long history of depression and anxiety, with persistent anger and difficulty sleeping. He experiences agitation, frustration, and nightmares but is hesitant to start new treatments for sleep.  Socially, he drinks diet soda and abstains from alcohol. 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      Social History   Tobacco Use  Smoking Status Never  Smokeless Tobacco Never    Current Outpatient Medications on File Prior to Visit  Medication Sig Dispense Refill   atorvastatin  (LIPITOR) 40 MG tablet Take 1 tablet (40 mg total) by mouth daily. 90 tablet 3   Blood Glucose Monitoring Suppl (ONE TOUCH BASIC SYSTEM) W/DEVICE KIT To use to check blood sugars once daily 1 each 0   buPROPion  (WELLBUTRIN  XL) 300 MG 24 hr tablet  Take 1 tablet (300 mg total) by mouth daily. 90 tablet 3   cholecalciferol (VITAMIN D ) 1000 UNITS tablet Take 1,000 Units by mouth daily.     escitalopram  (LEXAPRO ) 20 MG tablet Take 1 tablet (20 mg total) by mouth daily. 90 tablet 1   glucose blood test strip To use to check blood sugar once daily 100 each 12   levothyroxine  (SYNTHROID ) 50 MCG tablet Take 1 tablet (50 mcg total) by mouth daily. 90 tablet 3   lisinopril  (ZESTRIL ) 10 MG tablet Take 1 tablet (10 mg total) by mouth daily. 90 tablet 3   meloxicam  (MOBIC ) 15 MG tablet TAKE 1 TABLET BY MOUTH DAILY AS NEEDED. 30 tablet 5   metFORMIN  (GLUCOPHAGE ) 500 MG tablet TAKE 2 TABLETS BY MOUTH EVERY MORNING AND 2 TABLETS EVERY EVENING 360 tablet 3   omeprazole  (PRILOSEC) 40 MG capsule Take 1 capsule (40 mg total) by mouth daily. 90 capsule 3   ONE TOUCH LANCETS MISC To use to check blood sugar once daily 100 each 3   Semaglutide ,0.25 or 0.5MG /DOS, (OZEMPIC , 0.25 OR 0.5 MG/DOSE,) 2 MG/3ML SOPN Inject 0.5 mg into the skin once a week. 9 mL 1   tamsulosin  (FLOMAX ) 0.4 MG CAPS capsule Take 2 capsules (0.8 mg total) by mouth daily. 180 capsule 1   No current facility-administered medications on file prior to visit.     ROS see history of present illness  Objective  Physical Exam Vitals:   06/27/24 1122 06/27/24 1308  BP: (!) 158/60 (!) 142/60  Pulse: 70   Temp: 98.2 F (36.8 C)   SpO2: 99%     BP Readings  from Last 3 Encounters:  06/27/24 (!) 142/60  01/29/24 118/66  12/14/23 124/70   Wt Readings from Last 3 Encounters:  06/27/24 220 lb 6.4 oz (100 kg)  06/11/24 200 lb (90.7 kg)  01/29/24 200 lb 3.2 oz (90.8 kg)    Physical Exam Constitutional:      General: He is not in acute distress.    Appearance: Normal appearance.  HENT:     Head: Normocephalic.  Cardiovascular:     Rate and Rhythm: Normal rate and regular rhythm.     Heart sounds: Normal heart sounds.  Pulmonary:     Effort: Pulmonary effort is normal.     Breath  sounds: Normal breath sounds.  Skin:    General: Skin is warm and dry.  Neurological:     General: No focal deficit present.     Mental Status: He is alert.  Psychiatric:        Mood and Affect: Mood normal.        Behavior: Behavior normal.      Assessment/Plan: Please see individual problem list.  Assessment and Plan    Hypertension     Type 2 Diabetes Mellitus   Blood glucose levels are normal at home. Continue Ozempic  and metformin .  Hyperlipidemia   Continue atorvastatin  for cholesterol management.  Hypothyroidism   Continue levothyroxine . No chest pain, shortness of breath, or leg swelling reported.  Dizziness   Chronic dizziness has improved after stopping hydrochlorothiazide , with no recent falls.  Depression and Anxiety   He is on Wellbutrin  and reports anger, frustration, and sleep difficulties. Discussed prazosin or Seroquel for sleep and mood stabilization, but he is hesitant to start new medications. Encourage consideration of treatment options and contact if interested. Offer prazosin or Seroquel if he decides to try medication.    Primary hypertension Assessment & Plan: Blood pressure is elevated today, likely due to stress, but home readings show well-controlled hypertension on lisinopril . Previously well controlled. Mild improvement noted on recheck. He will continue Lisinopril  10 mg daily and continue monitoring his blood pressure at home. He will contact if remaining consistently elevated.  Orders: -     Comprehensive metabolic panel with GFR -     CBC with Differential/Platelet  Hypothyroidism, unspecified type -     TSH  Type 2 diabetes mellitus with obesity (HCC) -     Hemoglobin A1c  Moderate episode of recurrent major depressive disorder (HCC)  Hyperlipidemia, unspecified hyperlipidemia type -     Lipid panel  Vitamin D  deficiency -     VITAMIN D  25 Hydroxy (Vit-D Deficiency, Fractures)     Return in about 6 months (around  12/28/2024) for Follow up.   Timothy Glance, NP-C [facility]

## 2024-07-08 NOTE — Assessment & Plan Note (Addendum)
 Blood pressure is elevated today, likely due to stress, but home readings show well-controlled hypertension on lisinopril . Previously well controlled. Mild improvement noted on recheck. He will continue Lisinopril  10 mg daily and continue monitoring his blood pressure at home. He will contact if remaining consistently elevated.

## 2024-07-09 ENCOUNTER — Encounter: Payer: Self-pay | Admitting: Nurse Practitioner

## 2024-07-09 NOTE — Assessment & Plan Note (Signed)
 He is on Wellbutrin  XL and Lexapro  and reports anger, frustration, and sleep difficulties. Discussed prazosin or Seroquel for sleep and mood stabilization, but he is hesitant to start new medications. Encourage consideration of treatment options and contact if interested. Continue Wellbutrin  XL 300 mg daily and Lexapro  20 mg daily. We will continue to monitor.

## 2024-07-09 NOTE — Assessment & Plan Note (Signed)
 Continue atorvastatin  40 mg daily for cholesterol management. Check lipid panel.

## 2024-07-09 NOTE — Assessment & Plan Note (Signed)
 Managed with Levothyroxine  50 mcg daily with no symptoms reported. Continue Levothyroxine . Check TSH.

## 2024-07-09 NOTE — Assessment & Plan Note (Signed)
 Blood sugar is well controlled with Metformin  and Ozempic . Continue Metformin  1000mg  twice daily and Ozempic  0.5mg  weekly. Last A1c- 5.5. Encouraged healthy diet and remaining active.Check A1c today.

## 2024-07-15 DIAGNOSIS — Z961 Presence of intraocular lens: Secondary | ICD-10-CM | POA: Diagnosis not present

## 2024-07-15 DIAGNOSIS — H0288B Meibomian gland dysfunction left eye, upper and lower eyelids: Secondary | ICD-10-CM | POA: Diagnosis not present

## 2024-07-15 DIAGNOSIS — H53021 Refractive amblyopia, right eye: Secondary | ICD-10-CM | POA: Diagnosis not present

## 2024-07-15 DIAGNOSIS — H0288A Meibomian gland dysfunction right eye, upper and lower eyelids: Secondary | ICD-10-CM | POA: Diagnosis not present

## 2024-07-15 DIAGNOSIS — E119 Type 2 diabetes mellitus without complications: Secondary | ICD-10-CM | POA: Diagnosis not present

## 2024-07-15 DIAGNOSIS — H26492 Other secondary cataract, left eye: Secondary | ICD-10-CM | POA: Diagnosis not present

## 2024-08-16 ENCOUNTER — Other Ambulatory Visit: Payer: Self-pay | Admitting: Nurse Practitioner

## 2024-09-06 ENCOUNTER — Other Ambulatory Visit: Payer: Self-pay | Admitting: Nurse Practitioner

## 2024-11-12 ENCOUNTER — Other Ambulatory Visit: Payer: Self-pay | Admitting: Nurse Practitioner

## 2024-11-28 ENCOUNTER — Other Ambulatory Visit: Payer: Self-pay | Admitting: Nurse Practitioner

## 2024-11-28 DIAGNOSIS — M1990 Unspecified osteoarthritis, unspecified site: Secondary | ICD-10-CM

## 2024-11-28 NOTE — Telephone Encounter (Signed)
 Refilled: 05/20/2024 Last OV: 06/27/2024 Next OV: 12/30/2024 Last CMP was 06/27/2024

## 2024-12-02 ENCOUNTER — Other Ambulatory Visit (HOSPITAL_COMMUNITY): Payer: Self-pay

## 2024-12-02 ENCOUNTER — Telehealth: Payer: Self-pay

## 2024-12-02 NOTE — Telephone Encounter (Signed)
 Pharmacy Patient Advocate Encounter  Received notification from [employer] that Prior Authorization for OZEMPIC  2MG /3ML has been APPROVED from 12/02/24 to 12/03/25. Unable to obtain price due to refill too soon rejection, last fill date 11/12/24 next available fill date12/31/25   PA #/Case ID/Reference #: [id number]

## 2024-12-30 ENCOUNTER — Ambulatory Visit: Admitting: Nurse Practitioner

## 2025-02-26 ENCOUNTER — Ambulatory Visit: Admitting: Nurse Practitioner

## 2025-06-16 ENCOUNTER — Ambulatory Visit
